# Patient Record
Sex: Female | Born: 1950 | Race: White | Hispanic: No | Smoking: Never smoker
Health system: Southern US, Community
[De-identification: ages and names within clinical notes are randomized; demographics above are authoritative.]

## PROBLEM LIST (undated history)

## (undated) DIAGNOSIS — C801 Malignant (primary) neoplasm, unspecified: Secondary | ICD-10-CM

## (undated) DIAGNOSIS — I10 Essential (primary) hypertension: Secondary | ICD-10-CM

## (undated) DIAGNOSIS — I2510 Atherosclerotic heart disease of native coronary artery without angina pectoris: Secondary | ICD-10-CM

## (undated) DIAGNOSIS — F419 Anxiety disorder, unspecified: Secondary | ICD-10-CM

## (undated) DIAGNOSIS — I739 Peripheral vascular disease, unspecified: Secondary | ICD-10-CM

## (undated) DIAGNOSIS — M792 Neuralgia and neuritis, unspecified: Secondary | ICD-10-CM

## (undated) DIAGNOSIS — Z87442 Personal history of urinary calculi: Secondary | ICD-10-CM

## (undated) DIAGNOSIS — E785 Hyperlipidemia, unspecified: Secondary | ICD-10-CM

## (undated) DIAGNOSIS — G709 Myoneural disorder, unspecified: Secondary | ICD-10-CM

## (undated) DIAGNOSIS — I6381 Other cerebral infarction due to occlusion or stenosis of small artery: Secondary | ICD-10-CM

## (undated) DIAGNOSIS — M1990 Unspecified osteoarthritis, unspecified site: Secondary | ICD-10-CM

## (undated) DIAGNOSIS — I639 Cerebral infarction, unspecified: Secondary | ICD-10-CM

## (undated) DIAGNOSIS — E119 Type 2 diabetes mellitus without complications: Secondary | ICD-10-CM

## (undated) HISTORY — PX: EYE SURGERY: SHX253

## (undated) HISTORY — DX: Peripheral vascular disease, unspecified: I73.9

## (undated) HISTORY — DX: Type 2 diabetes mellitus without complications: E11.9

## (undated) HISTORY — DX: Other cerebral infarction due to occlusion or stenosis of small artery: I63.81

## (undated) HISTORY — PX: FRACTURE SURGERY: SHX138

## (undated) HISTORY — PX: CHOLECYSTECTOMY: SHX55

## (undated) HISTORY — DX: Hyperlipidemia, unspecified: E78.5

## (undated) HISTORY — DX: Essential (primary) hypertension: I10

## (undated) HISTORY — PX: MASTECTOMY: SHX3

---

## 2001-10-04 ENCOUNTER — Other Ambulatory Visit: Admission: RE | Admit: 2001-10-04 | Discharge: 2001-10-04 | Payer: Self-pay | Admitting: Family Medicine

## 2001-10-11 ENCOUNTER — Encounter: Admission: RE | Admit: 2001-10-11 | Discharge: 2001-10-11 | Payer: Self-pay | Admitting: Family Medicine

## 2001-10-11 ENCOUNTER — Encounter: Payer: Self-pay | Admitting: Family Medicine

## 2005-11-15 HISTORY — PX: CORONARY ARTERY BYPASS GRAFT: SHX141

## 2006-10-20 ENCOUNTER — Ambulatory Visit: Payer: Self-pay | Admitting: Cardiology

## 2006-10-24 ENCOUNTER — Ambulatory Visit: Payer: Self-pay | Admitting: Cardiology

## 2006-10-27 ENCOUNTER — Ambulatory Visit: Payer: Self-pay | Admitting: Cardiovascular Disease

## 2006-10-27 ENCOUNTER — Inpatient Hospital Stay (HOSPITAL_BASED_OUTPATIENT_CLINIC_OR_DEPARTMENT_OTHER): Admission: RE | Admit: 2006-10-27 | Discharge: 2006-10-27 | Payer: Self-pay | Admitting: Cardiology

## 2006-11-10 ENCOUNTER — Inpatient Hospital Stay (HOSPITAL_COMMUNITY)
Admission: RE | Admit: 2006-11-10 | Discharge: 2006-11-14 | Payer: Self-pay | Admitting: Thoracic Surgery (Cardiothoracic Vascular Surgery)

## 2006-12-12 ENCOUNTER — Ambulatory Visit: Payer: Self-pay | Admitting: Cardiology

## 2006-12-14 ENCOUNTER — Ambulatory Visit: Payer: Self-pay | Admitting: Cardiology

## 2007-01-03 ENCOUNTER — Ambulatory Visit: Payer: Self-pay | Admitting: Cardiology

## 2007-01-09 ENCOUNTER — Ambulatory Visit: Payer: Self-pay | Admitting: Thoracic Surgery (Cardiothoracic Vascular Surgery)

## 2007-05-11 ENCOUNTER — Ambulatory Visit: Payer: Self-pay | Admitting: Cardiology

## 2007-05-11 ENCOUNTER — Emergency Department (HOSPITAL_COMMUNITY): Admission: EM | Admit: 2007-05-11 | Discharge: 2007-05-11 | Payer: Self-pay | Admitting: Emergency Medicine

## 2007-06-13 ENCOUNTER — Ambulatory Visit: Payer: Self-pay | Admitting: Cardiology

## 2007-07-04 ENCOUNTER — Ambulatory Visit: Payer: Self-pay | Admitting: Cardiology

## 2008-01-09 ENCOUNTER — Ambulatory Visit: Payer: Self-pay | Admitting: Cardiology

## 2008-01-09 ENCOUNTER — Encounter: Payer: Self-pay | Admitting: Physician Assistant

## 2008-03-08 ENCOUNTER — Ambulatory Visit: Payer: Self-pay | Admitting: Cardiology

## 2008-03-22 ENCOUNTER — Ambulatory Visit: Payer: Self-pay | Admitting: Cardiology

## 2008-03-22 ENCOUNTER — Inpatient Hospital Stay (HOSPITAL_COMMUNITY): Admission: EM | Admit: 2008-03-22 | Discharge: 2008-03-26 | Payer: Self-pay | Admitting: Emergency Medicine

## 2008-03-22 ENCOUNTER — Encounter (INDEPENDENT_AMBULATORY_CARE_PROVIDER_SITE_OTHER): Payer: Self-pay | Admitting: Pediatrics

## 2008-08-25 ENCOUNTER — Emergency Department (HOSPITAL_COMMUNITY): Admission: EM | Admit: 2008-08-25 | Discharge: 2008-08-25 | Payer: Self-pay | Admitting: Emergency Medicine

## 2008-09-20 ENCOUNTER — Encounter: Payer: Self-pay | Admitting: Cardiology

## 2008-10-30 ENCOUNTER — Encounter: Payer: Self-pay | Admitting: Physician Assistant

## 2008-10-30 ENCOUNTER — Ambulatory Visit: Payer: Self-pay | Admitting: Cardiology

## 2008-11-01 ENCOUNTER — Encounter: Payer: Self-pay | Admitting: Physician Assistant

## 2008-11-15 HISTORY — PX: MASTECTOMY: SHX3

## 2008-11-28 ENCOUNTER — Encounter: Payer: Self-pay | Admitting: Cardiology

## 2008-11-28 ENCOUNTER — Encounter: Payer: Self-pay | Admitting: Physician Assistant

## 2008-11-28 ENCOUNTER — Ambulatory Visit: Payer: Self-pay | Admitting: Cardiology

## 2009-04-01 ENCOUNTER — Ambulatory Visit: Payer: Self-pay | Admitting: Cardiology

## 2009-07-31 DIAGNOSIS — E782 Mixed hyperlipidemia: Secondary | ICD-10-CM | POA: Insufficient documentation

## 2009-07-31 DIAGNOSIS — E785 Hyperlipidemia, unspecified: Secondary | ICD-10-CM

## 2009-07-31 DIAGNOSIS — I10 Essential (primary) hypertension: Secondary | ICD-10-CM

## 2009-07-31 DIAGNOSIS — E1169 Type 2 diabetes mellitus with other specified complication: Secondary | ICD-10-CM

## 2009-07-31 DIAGNOSIS — I25810 Atherosclerosis of coronary artery bypass graft(s) without angina pectoris: Secondary | ICD-10-CM | POA: Insufficient documentation

## 2009-07-31 HISTORY — DX: Essential (primary) hypertension: I10

## 2009-10-20 ENCOUNTER — Encounter: Admission: RE | Admit: 2009-10-20 | Discharge: 2009-10-20 | Payer: Self-pay | Admitting: Neurology

## 2009-11-15 HISTORY — PX: OTHER SURGICAL HISTORY: SHX169

## 2009-11-26 ENCOUNTER — Encounter: Admission: RE | Admit: 2009-11-26 | Discharge: 2009-11-26 | Payer: Self-pay | Admitting: Internal Medicine

## 2009-12-01 ENCOUNTER — Encounter: Payer: Self-pay | Admitting: Cardiology

## 2009-12-04 ENCOUNTER — Encounter: Admission: RE | Admit: 2009-12-04 | Discharge: 2009-12-04 | Payer: Self-pay | Admitting: Internal Medicine

## 2009-12-12 ENCOUNTER — Telehealth (INDEPENDENT_AMBULATORY_CARE_PROVIDER_SITE_OTHER): Payer: Self-pay | Admitting: *Deleted

## 2009-12-15 ENCOUNTER — Encounter: Payer: Self-pay | Admitting: Cardiology

## 2009-12-16 ENCOUNTER — Ambulatory Visit: Payer: Self-pay | Admitting: Cardiology

## 2009-12-16 DIAGNOSIS — C50919 Malignant neoplasm of unspecified site of unspecified female breast: Secondary | ICD-10-CM | POA: Insufficient documentation

## 2009-12-16 DIAGNOSIS — I4510 Unspecified right bundle-branch block: Secondary | ICD-10-CM

## 2009-12-19 ENCOUNTER — Ambulatory Visit (HOSPITAL_COMMUNITY): Admission: RE | Admit: 2009-12-19 | Discharge: 2009-12-19 | Payer: Self-pay | Admitting: Hematology and Oncology

## 2010-01-05 ENCOUNTER — Encounter (INDEPENDENT_AMBULATORY_CARE_PROVIDER_SITE_OTHER): Payer: Self-pay | Admitting: General Surgery

## 2010-01-05 ENCOUNTER — Inpatient Hospital Stay (HOSPITAL_COMMUNITY): Admission: RE | Admit: 2010-01-05 | Discharge: 2010-01-07 | Payer: Self-pay | Admitting: General Surgery

## 2010-01-12 ENCOUNTER — Encounter: Payer: Self-pay | Admitting: Cardiology

## 2010-01-27 ENCOUNTER — Encounter: Payer: Self-pay | Admitting: Cardiology

## 2010-02-12 ENCOUNTER — Ambulatory Visit (HOSPITAL_COMMUNITY): Admission: RE | Admit: 2010-02-12 | Discharge: 2010-02-12 | Payer: Self-pay | Admitting: General Surgery

## 2010-03-09 ENCOUNTER — Encounter: Payer: Self-pay | Admitting: Cardiology

## 2010-03-17 ENCOUNTER — Encounter: Payer: Self-pay | Admitting: Cardiology

## 2010-05-14 ENCOUNTER — Encounter: Payer: Self-pay | Admitting: Cardiology

## 2010-05-19 ENCOUNTER — Ambulatory Visit: Admission: RE | Admit: 2010-05-19 | Discharge: 2010-08-05 | Payer: Self-pay | Admitting: Radiation Oncology

## 2010-06-25 ENCOUNTER — Encounter: Payer: Self-pay | Admitting: Cardiology

## 2010-08-06 ENCOUNTER — Encounter: Payer: Self-pay | Admitting: Cardiology

## 2010-08-17 ENCOUNTER — Ambulatory Visit: Payer: Self-pay | Admitting: Cardiology

## 2010-12-06 ENCOUNTER — Encounter: Payer: Self-pay | Admitting: Neurology

## 2010-12-06 ENCOUNTER — Encounter: Payer: Self-pay | Admitting: General Surgery

## 2010-12-08 ENCOUNTER — Encounter: Payer: Self-pay | Admitting: Cardiology

## 2010-12-17 NOTE — Letter (Signed)
Summary: External Correspondence/ [name]  External Correspondence/ [facility]   Imported By: Dorise Hiss 01/15/2010 16:02:19  _____________________________________________________________________  External Attachment:    Type:   Image     Comment:   External Document

## 2010-12-17 NOTE — Letter (Signed)
Summary: External [facility]ence/ [facility]  External Correspondence/ [facility]   Imported By: Dorise Hiss 12/18/2009 10:14:36  _____________________________________________________________________  External Attachment:    Type:   Image     Comment:   External Document

## 2010-12-17 NOTE — Letter (Signed)
Summary: External Correspondence/ [name]  External Correspondence/ [facility]   Imported By: Dorise Hiss 08/11/2010 09:05:17  _____________________________________________________________________  External Attachment:    Type:   Image     Comment:   External Document

## 2010-12-17 NOTE — Assessment & Plan Note (Signed)
Summary: rov-surgical clearance needed per   Visit Type:  Follow-up Primary Provider:  Marilu Mccann   History of Present Illness: the patient is a 60 year old female with a history of coronary disease status post coronary artery bypass grafting in December of 2007. The patient has recently been diagnosed with ductal carcinoma of the breast. I was asked by Dr. Cleone Mccann to see the patient in preoperative consultation prior to mastectomy. The patient currently takes aspirin Plavix in part for graft protection of coronary bypass grafts. Also because the patient has a history of cerebrovascular disease and lacunar strokes followed by Dr. Pearlean Mccann. Reportedly according to the patient's husband she had a CAT scan done recently with no evidence of stroke. She also cerebrovascular disease which is followed by carotid Dopplers. The patient denies any substernal chest pain. She has no shortness of breath. She has no orthopnea PND. From a cardiovascular standpoint she appears to be stable. She has no palpitations or syncope. The patient daughter questioned also the use of Lovenox perioperatively.  Clinical Review Panels:  CXR CXR results There has been previous CABG.  Heart size normal.         Vascular clips right upper abdomen.  No effusion.  Lungs clear.                   IMPRESSION:         No acute disease post CABG (11/28/2008)  Echocardiogram Echocardiogram  -  There is question of mild septal dyssynergy. Overall left         ventricular systolic function was normal. Left ventricular         ejection fraction was estimated to be 60 %.   -  Normal aortic valve   -  Normal mitral valve   -  The left atrium was mildly dilated.   -  The right ventricle was mildly dilated.   -  There is no obvious cardiac source of embolus.    IMPRESSIONS   -  There is no obvious cardiac source of embolus. (03/22/2008)  Carotid Studies Carotid Doppler Results MPRESSION: The vertebral arteries are patent bilaterally  with         antegrade flow. Carotid and vertebral wave forms appear normal. No         significant plaque or stenosis is seen. Some retrograde flow was seen          in diastole. The velocity in each internal carotid artery is in the         less than 50% stenosis range bilaterally. (09/06/2007)  Cardiac Imaging Cardiac Cath Findings  ASSESSMENT:  1. Severe native three-vessel coronary artery disease.  2. Moderate segmental left ventricular dysfunction.   DISCUSSION:  I will review the films further with colleagues.  However,  the patient has severe disease with the presence of LV dysfunction in  the setting of underlying diabetes.  I think she would best be treated  with coronary bypass surgery.  Unfortunately, her LAD target is  suboptimal due to a small vessel and diffuse disease throughout the mid  and distal portions of the vessel.  Her obtuse marginal target is good,  as it is a large-caliber vessel with very little disease in its mid and  distal portions.  The posterolateral branches of the left circumflex  were also fairly small but may be reasonable targets.  The right  coronary artery is diffusely diseased, and I am not sure that her PD  branch which fills from  left-sided collaterals will be a good target.  I  will ask for one of the surgeons in the CVTS group to review her films  and give [place] their opinion.  In the meantime, I will review the patient's  medical therapy a make sure that she is on an appropriate anti-ischemic  regimen.   [name]. [name] Seltzer, MD (10/27/2006)    Current Medications (verified): 1)  Novolog 100 Unit/ml Soln (Insulin Aspart) .... Sliding Scale 2)  Lantus 100 Unit/ml Soln (Insulin Glargine) .... 20 Units Bedtime 3)  Metformin Hcl 1000 Mg Tabs (Metformin Hcl) .... Take 1 Tab Two Times A Day 4)  Neurontin 300 Mg Caps (Gabapentin) .... Take 1 Tab Two Times A Day 5)  Simvastatin 80 Mg Tabs (Simvastatin) .... Take 1 Tab At Bedtime 6)  Aspir-Low 81 Mg  Tbec (Aspirin) .... Take 1 Tab Daily 7)  Plavix 75 Mg Tabs (Clopidogrel Bisulfate) .... Take 1 Tab Daily 8)  Neurpath-B 3-35-2 Mg Tabs (L-Methylfolate-B6-B12) .... Take 1 Tab Daily 9)  Mobic 7.5 Mg Tabs (Meloxicam) .... Take 1 Tab Two Times A Day 10)  Fosamax 70 Mg Tabs (Alendronate Sodium) .... Take 1 Tab Weekly 11)  Celexa 10 Mg Tabs (Citalopram Hydrobromide) .... Take 1 Tab Daily  Allergies (verified): No Known Drug Allergies  Past History:  Past Medical History: Last updated: 07/31/2009 HYPERLIPIDEMIA-MIXED (ICD-272.4) HYPERTENSION, UNSPECIFIED (ICD-401.9) CAD, ARTERY BYPASS GRAFT (ICD-414.04) Cerebrovascular disease followed by Dr. Pearlean Mccann.  History of lacunar strokes.  Ataxia, resolved.  Type 2 diabetes mellitus  Past Surgical History: Last updated: 07/31/2009 CABG eye  Family History: Last updated: 07/31/2009 Family History of Coronary Artery Disease:  Family History of CVA or Stroke:   Social History: Last updated: 07/31/2009 Full Time Married  Tobacco Use - No.  Alcohol Use - no Drug Use - no  Risk Factors: Smoking Status: never (07/31/2009)  Review of Systems       The patient complains of fatigue and anxiety.  The patient denies malaise, fever, weight gain/loss, vision loss, decreased hearing, hoarseness, chest pain, palpitations, shortness of breath, prolonged cough, wheezing, sleep apnea, coughing up blood, abdominal pain, blood in stool, nausea, vomiting, diarrhea, heartburn, incontinence, blood in urine, muscle weakness, joint pain, leg swelling, rash, skin lesions, headache, fainting, dizziness, depression, enlarged lymph nodes, easy bruising or bleeding, and environmental allergies.    Vital Signs:  Patient profile:   60 year old female Height:      64 inches Weight:      136 pounds BMI:     23.43 Pulse rate:   71 / minute BP sitting:   135 / 75  (right arm)  Vitals Entered By: [name] Saa, CNA (December 16, 2009 9:25 AM)  Physical  Exam  Additional Exam:  General: Well-developed, well-nourished in no distress head: Normocephalic and atraumatic eyes PERRLA/EOMI intact, conjunctiva and lids normal nose: No deformity or lesions mouth normal dentition, normal posterior pharynx neck: Supple, no JVD.  No masses, thyromegaly or abnormal cervical nodes lungs: Normal breath sounds bilaterally without wheezing.  Normal percussion heart: regular rate and rhythm with normal S1 and S2, no S3 or S4.  PMI is normal.  No pathological murmurs abdomen: Normal bowel sounds, abdomen is soft and nontender without masses, organomegaly or hernias noted.  No hepatosplenomegaly musculoskeletal: Back normal, normal gait muscle strength and tone normal pulsus: Pulse is normal in all 4 extremities Extremities: No peripheral pitting edema neurologic: Alert and oriented x 3 skin: Intact without lesions or rashes cervical  nodes: No significant adenopathy psychologic: Normal affect    EKG  Procedure date:  12/16/2009  Findings:      normal sinus rhythm, borderline left atrial enlargement and right bundle branch block. Nonspecific ST-T wave changes.  Impression & Recommendations:  Problem # 1:  CAD, ARTERY BYPASS GRAFT (ICD-414.04) the patient is status post coronary bypass grafting.she has currently no chest pain. She has no significant cardiovascular symptoms. From a cardiac perspective it appears to be safe to stop her aspirin and Plavix approximately one week before surgery. I would recommend that aspirin and Plavix be restarted as soon as possible after surgery. There is currently no indication for cardiovascular stress testing prior to surgery. The patient is asymptomatic and she had coronary bypass grafting within the last 5 years. Her electrocardiogram shows normal sinus rhythm with right bundle branch block which is chronic. Her updated medication list for this problem includes:    Aspir-low 81 Mg Tbec (Aspirin) .Marland Kitchen... Take 1 tab  daily    Plavix 75 Mg Tabs (Clopidogrel bisulfate) .Marland Kitchen... Take 1 tab daily  Problem # 2:  HYPERTENSION, UNSPECIFIED (ICD-401.9) blood pressure is currently well controlled. I made no changes in the patient's medical regimen. Her updated medication list for this problem includes:    Aspir-low 81 Mg Tbec (Aspirin) .Marland Kitchen... Take 1 tab daily  Problem # 3:  BREAST CANCER (ICD-174.9) patient is scheduled for mastectomy.  Problem # 4:  PREOPERATIVE EXAMINATION (ICD-V72.84) as outlined above the patient should be safe at low risk from a cardiovascular standpoint for mastectomy.  Patient Instructions: 1)  Stop Aspirin and Plavix one week prior to surgery and resume as soon as possible after. 2)  Follow up in  6 months.  Appended Document: rov-surgical clearance needed per Received fax from [facility] (Dr. Dwain Sarna) stating that he is okay for pt. to remain on her Aspirin the entire time.  Discussed with Dr. Andee Lineman and he is okay with this as well.

## 2010-12-17 NOTE — Letter (Signed)
Summary: [facility] OFFICE NOTE  [facility] OFFICE NOTE   Imported By: Claudette Laws 02/03/2010 09:14:41  _____________________________________________________________________  External Attachment:    Type:   Image     Comment:   External Document

## 2010-12-17 NOTE — Letter (Signed)
Summary: External Correspondence/ [name]  External Correspondence/ [facility]   Imported By: Dorise Hiss 06/30/2010 12:34:41  _____________________________________________________________________  External Attachment:    Type:   Image     Comment:   External Document

## 2010-12-17 NOTE — Assessment & Plan Note (Signed)
Summary: 6  mo fu per aug remidner   Visit Type:  Follow-up Primary Provider:  Marilu Favre   History of Present Illness: the patient is a 60 year old female with history of coronary artery disease, status post coronary bypass grafting in December of 2007. The patient has been diagnosed with breast cancer. She status post mastectomy and axillary lymph node dissection/2/4 nodes positive/ as well as chemotherapy and radiation therapy. The patient is currently on an aromatase inhibitor.  The patient denies any substernal chest pain no shortness of breath . She denies orthopnea or PND. She has no palpitations or syncope. She denies palpitations. From a cardiac standpoint she appears to be stable. She's compliant with her medical regimen.  Preventive Screening-Counseling & Management  Alcohol-Tobacco     Smoking Status: never  Current Medications (verified): 1)  Novolog 100 Unit/ml Soln (Insulin Aspart) .... Sliding Scale 2)  Lantus 100 Unit/ml Soln (Insulin Glargine) .... Use As Directed At Bedtime 3)  Metformin Hcl 1000 Mg Tabs (Metformin Hcl) .... Take 1 Tab Two Times A Day 4)  Neurontin 300 Mg Caps (Gabapentin) .... Take 1 Tab Two Times A Day 5)  Simvastatin 80 Mg Tabs (Simvastatin) .... Take 1 Tab At Bedtime 6)  Aspir-Low 81 Mg Tbec (Aspirin) .... Take 1 Tab Daily 7)  Plavix 75 Mg Tabs (Clopidogrel Bisulfate) .... Take 1 Tab Daily 8)  Neurpath-B 3-35-2 Mg Tabs (L-Methylfolate-B6-B12) .... Take 1 Tab Daily 9)  Mobic 7.5 Mg Tabs (Meloxicam) .... Take 1 Tab Two Times A Day 10)  Fosamax 70 Mg Tabs (Alendronate Sodium) .... Take 1 Tab Weekly 11)  Celexa 10 Mg Tabs (Citalopram Hydrobromide) .... Take 1 Tab Daily 12)  Folic Acid 1 Mg Tabs (Folic Acid) .... Take 1 Tablet By Mouth Once A Day 13)  Triamterene-Hctz 37.5-25 Mg Tabs (Triamterene-Hctz) .... Take 1 Tablet By Mouth Once A Day 14)  Letrozole 2.5 Mg Tabs (Letrozole) .... Take 1 Tablet By Mouth Once A Day  Allergies (verified): No Known  Drug Allergies  Comments:  Nurse/Medical Assistant: The patient's medication list and allergies were reviewed with the patient and were updated in the Medication and Allergy Lists.  Past History:  Past Medical History: HYPERLIPIDEMIA-MIXED (ICD-272.4) HYPERTENSION, UNSPECIFIED (ICD-401.9) CAD, ARTERY BYPASS GRAFT (ICD-414.04) Cerebrovascular disease followed by Dr. Pearlean Brownie.  History of lacunar strokes.  Ataxia, resolved.  Type 2 diabetes mellitus Status post mastectomy 2011 Status post chemotherapy and radiation therapy aromatase inhibitor therapy  Review of Systems       The patient complains of fatigue.  The patient denies malaise, fever, weight gain/loss, vision loss, decreased hearing, hoarseness, chest pain, palpitations, prolonged cough, wheezing, sleep apnea, coughing up blood, abdominal pain, blood in stool, nausea, vomiting, diarrhea, heartburn, incontinence, blood in urine, muscle weakness, joint pain, leg swelling, rash, skin lesions, headache, fainting, dizziness, depression, anxiety, enlarged lymph nodes, easy bruising or bleeding, and environmental allergies.    Vital Signs:  Patient profile:   60 year old female Height:      64 inches Weight:      137 pounds Pulse rate:   60 / minute BP sitting:   130 / 78  (right arm) Cuff size:   regular  Vitals Entered By: Carlye Grippe (August 17, 2010 2:48 PM)  Physical Exam  Additional Exam:  General: Well-developed, well-nourished in no distress head: Normocephalic and atraumatic eyes PERRLA/EOMI intact, conjunctiva and lids normal nose: No deformity or lesions mouth normal dentition, normal posterior pharynx neck: Supple, no JVD.  No  masses, thyromegaly or abnormal cervical nodes lungs: Normal breath sounds bilaterally without wheezing.  Normal percussion heart: regular rate and rhythm with normal S1 and S2, no S3 or S4.  PMI is normal.  social systolic murmur 2/6 abdomen: Normal bowel sounds, abdomen is soft and  nontender without masses, organomegaly or hernias noted.  No hepatosplenomegaly musculoskeletal: Back normal, normal gait muscle strength and tone normal pulsus: Pulse is normal in all 4 extremities Extremities: No peripheral pitting edema neurologic: Alert and oriented x 3 skin: Intact without lesions or rashes cervical nodes: No significant adenopathy psychologic: Normal affect    Impression & Recommendations:  Problem # 1:  BREAST CANCER (ICD-174.9) followed by Dr. Cleone Slim. She is status post mastectomy, chemoradiation therapy and is on aromatase inhibitor therapy  Problem # 2:  HYPERTENSION, UNSPECIFIED (ICD-401.9) blood pressure stable. Continue current medical regimen Her updated medication list for this problem includes:    Aspir-low 81 Mg Tbec (Aspirin) .Marland Kitchen... Take 1 tab daily    Triamterene-hctz 37.5-25 Mg Tabs (Triamterene-hctz) .Marland Kitchen... Take 1 tablet by mouth once a day  Problem # 3:  CAD, ARTERY BYPASS GRAFT (ICD-414.04) no recurrent chest pain. No indication for stress testing. Continue risk factor modification. Her updated medication list for this problem includes:    Aspir-low 81 Mg Tbec (Aspirin) .Marland Kitchen... Take 1 tab daily    Plavix 75 Mg Tabs (Clopidogrel bisulfate) .Marland Kitchen... Take 1 tab daily  Patient Instructions: 1)  Your physician recommends that you continue on your current medications as directed. Please refer to the Current Medication list given to you today. 2)  Follow up in  1 year

## 2010-12-17 NOTE — Letter (Signed)
Summary: External Correspondence/ [name]  External Correspondence/ [facility]   Imported By: Dorise Hiss 03/19/2010 12:08:03  _____________________________________________________________________  External Attachment:    Type:   Image     Comment:   External Document

## 2010-12-17 NOTE — Letter (Signed)
Summary: External Correspondence/ [name] NOTE  External Correspondence/ [facility]ENTER NOTE   Imported By: Dorise Hiss 05/19/2010 11:55:12  _____________________________________________________________________  External Attachment:    Type:   Image     Comment:   External Document

## 2010-12-17 NOTE — Progress Notes (Signed)
Summary: ? off plavix, asa  Phone Note Other Incoming   Caller: Alicia with Dr. Dwain Sarna Summary of Call: [phone] - overhead page  Pt. has pending lymph node biopsy.  Want to know if okay to be off ASA x 3 days and off Plavix x 5 days.   Hoover Brunette, LPN  December 12, 2009 4:13 PM   Follow-up for Phone Call        Yes, should be no problem. Restart particularly ASA ASAP post surgery Follow-up by: Lewayne Bunting, MD, [facility],  December 14, 2009 1:07 PM  Additional Follow-up for Phone Call Additional follow up Details #1::        Helmut Muster notified.  Will fax note to her.   Additional Follow-up by: Hoover Brunette, LPN,  December 15, 2009 4:26 PM

## 2010-12-17 NOTE — Consult Note (Signed)
Summary: CARDIOLOGY CONSULT/ [facility]  CARDIOLOGY CONSULT/ [facility]   Imported By: Zachary George 12/15/2009 16:57:42  _____________________________________________________________________  External Attachment:    Type:   Image     Comment:   External Document

## 2010-12-17 NOTE — Letter (Signed)
Summary: External Correspondence/ [name]  External Correspondence/ [facility]   Imported By: Dorise Hiss 12/10/2010 10:30:49  _____________________________________________________________________  External Attachment:    Type:   Image     Comment:   External Document

## 2010-12-17 NOTE — Letter (Signed)
Summary: [facility] - OFFICE NOTE  [facility] - OFFICE NOTE   Imported By: Claudette Laws 03/12/2010 10:56:36  _____________________________________________________________________  External Attachment:    Type:   Image     Comment:   External Document

## 2011-02-03 LAB — GLUCOSE, CAPILLARY
Glucose-Capillary: 129 mg/dL — ABNORMAL HIGH (ref 70–99)
Glucose-Capillary: 129 mg/dL — ABNORMAL HIGH (ref 70–99)
Glucose-Capillary: 143 mg/dL — ABNORMAL HIGH (ref 70–99)
Glucose-Capillary: 146 mg/dL — ABNORMAL HIGH (ref 70–99)
Glucose-Capillary: 180 mg/dL — ABNORMAL HIGH (ref 70–99)

## 2011-02-03 LAB — CBC
HCT: 37.2 % (ref 36.0–46.0)
Hemoglobin: 10.5 g/dL — ABNORMAL LOW (ref 12.0–15.0)
Hemoglobin: 11.3 g/dL — ABNORMAL LOW (ref 12.0–15.0)
MCHC: 33.6 g/dL (ref 30.0–36.0)
MCHC: 33.6 g/dL (ref 30.0–36.0)
MCHC: 33.8 g/dL (ref 30.0–36.0)
MCV: 90.1 fL (ref 78.0–100.0)
Platelets: 199 10*3/uL (ref 150–400)
Platelets: 230 10*3/uL (ref 150–400)
RBC: 3.72 MIL/uL — ABNORMAL LOW (ref 3.87–5.11)
RDW: 15 % (ref 11.5–15.5)
RDW: 15.2 % (ref 11.5–15.5)
WBC: 6.8 10*3/uL (ref 4.0–10.5)

## 2011-02-03 LAB — BASIC METABOLIC PANEL
BUN: 10 mg/dL (ref 6–23)
CO2: 26 mEq/L (ref 19–32)
CO2: 28 mEq/L (ref 19–32)
Calcium: 8.8 mg/dL (ref 8.4–10.5)
Calcium: 9.1 mg/dL (ref 8.4–10.5)
Chloride: 106 mEq/L (ref 96–112)
Creatinine, Ser: 0.64 mg/dL (ref 0.4–1.2)
GFR calc Af Amer: >60 mL/min (ref >60)
GFR calc non Af Amer: >60 mL/min (ref >60)
Glucose, Bld: 101 mg/dL — ABNORMAL HIGH (ref 70–99)
Sodium: 135 mEq/L (ref 135–145)
Sodium: 137 mEq/L (ref 135–145)

## 2011-02-03 LAB — COMPREHENSIVE METABOLIC PANEL
Alkaline Phosphatase: 71 U/L (ref 39–117)
BUN: 16 mg/dL (ref 6–23)
CO2: 27 mEq/L (ref 19–32)
Chloride: 109 mEq/L (ref 96–112)
Glucose, Bld: 127 mg/dL — ABNORMAL HIGH (ref 70–99)
Potassium: 5 mEq/L (ref 3.5–5.1)
Total Bilirubin: 0.2 mg/dL — ABNORMAL LOW (ref 0.3–1.2)

## 2011-02-03 LAB — DIFFERENTIAL
Basophils Absolute: 0 10*3/uL (ref 0.0–0.1)
Basophils Relative: 1 % (ref 0–1)
Neutro Abs: 4.6 10*3/uL (ref 1.7–7.7)
Neutrophils Relative %: 65 % (ref 43–77)

## 2011-02-03 LAB — CEA: CEA: 2.6 ng/mL (ref 0.0–5.0)

## 2011-02-04 LAB — GLUCOSE, CAPILLARY: Glucose-Capillary: 74 mg/dL (ref 70–99)

## 2011-02-07 LAB — DIFFERENTIAL
Basophils Absolute: 0 10*3/uL (ref 0.0–0.1)
Eosinophils Absolute: 0.1 10*3/uL (ref 0.0–0.7)
Eosinophils Relative: 2 % (ref 0–5)
Lymphocytes Relative: 19 % (ref 12–46)
Lymphs Abs: 1.2 10*3/uL (ref 0.7–4.0)
Neutrophils Relative %: 71 % (ref 43–77)

## 2011-02-07 LAB — PROTIME-INR
INR: 0.87 (ref 0.00–1.49)
Prothrombin Time: 11.8 seconds (ref 11.6–15.2)

## 2011-02-07 LAB — CBC
HCT: 35.4 % — ABNORMAL LOW (ref 36.0–46.0)
Platelets: 278 10*3/uL (ref 150–400)
RDW: 15.6 % — ABNORMAL HIGH (ref 11.5–15.5)
WBC: 6.5 10*3/uL (ref 4.0–10.5)

## 2011-02-07 LAB — BASIC METABOLIC PANEL
BUN: 15 mg/dL (ref 6–23)
Calcium: 10.3 mg/dL (ref 8.4–10.5)
Creatinine, Ser: 0.76 mg/dL (ref 0.4–1.2)
GFR calc non Af Amer: >60 mL/min (ref >60)
Glucose, Bld: 120 mg/dL — ABNORMAL HIGH (ref 70–99)

## 2011-02-07 LAB — GLUCOSE, CAPILLARY
Glucose-Capillary: 100 mg/dL — ABNORMAL HIGH (ref 70–99)
Glucose-Capillary: 88 mg/dL (ref 70–99)

## 2011-03-30 NOTE — Discharge Summary (Signed)
[name], [name]             ACCOUNT NO.:  [id number]   MEDICAL RECORD NO.:  [id number]          PATIENT TYPE:  INP   LOCATION:  5503                         FACILITY:  [facility]   PHYSICIAN:  Pramod P. Pearlean Brownie, MD    DATE OF BIRTH:  01-24-1951   DATE OF ADMISSION:  03/21/2008  DATE OF DISCHARGE:  03/26/2008                               DISCHARGE SUMMARY   DIAGNOSES AT TIME OF DISCHARGE:  1. Severe cavernous carotid stenosis.  2. Syncope, questionably related to stenosis/other.  3. Anxiety.  4. Type 2 diabetes.  5. Hypertension.  6. Dyslipidemia.  7. Painful diabetic neuropathy.  8. History of previous stroke.  9. Atherosclerotic vascular disease with coronary artery bypass graft      x3.  10.Laser surgery to her eye for her diabetes.   MEDICINES AT TIME OF DISCHARGE:  1. Lantus 20 units nightly.  2. NovoLog sliding scale insulin.  3. __________  75 mg b.i.d.  4. Pravastatin 40 mg nightly.  5. Metformin 1000 mg b.i.d.  6. Neurontin 100 mg b.i.d.  7. Xanax 0.25 mg nightly.  8. Plavix 75 mg a day.  9. Aspirin 81 mg a day.  10.Celexa 10 mg a day.  11.Metanx one a day.   STUDIES PERFORMED:  1. CT of the brain on admission shows no acute abnormalities, lacunar      infarct.  Right basal ganglia lacuna.  2. MRI of the brain shows no acute abnormality.  Remote lacuna in the      brain stem left greater than right corona radiata.  Chronic left      maxillary sinus disease.  3. MRA of the head shows mild atherosclerotic-type irregularity of      internal carotid arteries bilaterally.  Potential high-grade      stenosis of the distal left cavernous carotid artery.  Segmental      irregularity of MCA and PCA branches bilaterally compatible with      intracranial atherosclerotic-type changes.  Potential stenosis of      proximal left M1 segment.  4. CT angio of the brain shows high-grade stenosis of cavernous left      internal carotid artery associated with focal  calcification.      Proximal left M1 stenosis of 50-60%.  Branch vessels segmental      narrowing compatible with intracranial atherosclerotic-type      changes.  5. EKG, one with rightward axis deviation.  Then, other is normal      sinus rhythm.  6. __________  shows mild septal dyssynergy with EF of 60%.  No      obvious source of embolus.  7. Carotid Doppler shows no ICA stenosis.  8. Transcranial Doppler performed, results are pending.   LABORATORY STUDIES:  CBC with hemoglobin of 11.5, hematocrit 34.8,  otherwise normal chemistry, normal coags, and normal GI and liver  function tests.  Normal albumin 3.0.  Cardiac enzymes negative.  Cholesterol 93, triglycerides 91, HDL 32, and LDL 43.  Urinalysis is  negative.  Hemoglobin A1c 6.8.  Homocystine 22.   HISTORY OF PRESENT ILLNESS:  [name]  is a 60 year old right-  handed Caucasian female with multiple left brain stroke and a single  right pontine lesion with chronic left leg weakness and gait disorder  without right-sided symptoms.  The patient was last seen normal at 2:30  p.m. yesterday.  She was found at 3:00 p.m. sitting in her husband's  truck unresponsive.  It took her about 5 minutes to awaken.  When he got  her up, she seemed to be more unsteady on her feet, particularly weaker  on the left leg.  She was brought to the emergency room, but this was  not appreciated by the treatment team until 11 o'clock despite the fact  that the family arrived at 5:18 p.m.  Dr. Sharene Skeans then arrived to  evaluate her dystaxia.  He diagnosed her with syncope and possible TIA.  She was not a TPA candidate as the time of onset was greater than 3  hours.  She was admitted to the hospital for further stroke evaluation.   HOSPITAL COURSE:  MRI was negative for acute stroke.  It is felt her  syncope was likely related to stenosis in her cavernous carotid though  we cannot be sure.  There was some discussion about fixing her carotid   stenosis, but not sure it will fix the actual problem as it is not  certain that is the etiology.  Discussed having cerebral angiogram, PTCA  stent with the patient, and she is not interested at this time.  An EEG  was done in the hospital that was technically unrevealing, the formal  reading is pending, and plans were to repeat an outpatient EEG within  the next month or so.  We will also arrange outpatient cardiac  monitoring for the patient to evaluate for possible cardiac source of  syncope.  PT/OT evaluated the patient and recommended outpatient  therapies though the patient has refused.  We will discharge the patient  home on Plavix and follow up with Dr. Pearlean Brownie.   CONDITION AT DISCHARGE:  The patient alert and oriented x3.  Speech  clear.  No focal neurologic deficits.   DISCHARGE/PLAN:  1. Discharge home.  2. Aspirin and Plavix for secondary stroke prevention.  Consider      discontinuing one of these meds in 3-6 months.  3. Outpatient PT and OT recommended to see within one month.  4. Outpatient cardiac monitoring, [facility] consulted, and      they will contact the patient.  5. Follow up primary care physician within 1 month.  6. Follow up with Dr. Delia Heady in 2-3 months.      Annie Main, N.P.    ______________________________  Sunny Schlein. Pearlean Brownie, MD    SB/MEDQ  D:  03/26/2008  T:  03/27/2008  Job:  [id number]   cc:   Pramod P. Pearlean Brownie, MD  [facility]

## 2011-03-30 NOTE — Assessment & Plan Note (Signed)
[facility]                          [facility]   [name], [name] [name]                    MRN:          [id number]  DATE:01/09/2008                            DOB:          1951-02-27    PRIMARY CARDIOLOGIST:  Learta Codding, MD,  [facility]   REASON FOR VISIT:  Scheduled followup.   Since last seen here in the clinic in August 2008, by Dr. Andee Lineman, the  patient was briefly hospitalized here at [facility] (October 20 to 23rd,  2008) for symptoms suggestive of stroke.  She was admitted with ataxia  and was found to have an acute, small infarct in the right side of the  pons, by MRI imaging of the brain.  An initial head CT (without  contrast) indicated chronic lacunar infarcts, but with question of  possibly subacute, evolving lacunar infarcts on the right.  The patient  was seen by Dr. Ninetta Lights, the resident neurologist here in [place], and she  was subsequently placed on Plavix.  Carotid Dopplers were done and  normal.  A lipid profile indicated total cholesterol 163, HDL 41, and  LDL 88.   From a cardiac perspective, the patient denies any interim development  of signs/symptoms suggestive of unstable angina pectoris.  When she was  last seen in our clinic, she continued to complain of vertigo - like  symptoms, diagnosed as BPV by Dr. Andee Lineman.  He prescribed a 1-week course  of Xanax for her, and this apparently was quite effective.  She denies  any recurrent dizziness.  Of note, however, the recommendation to down-  titrate her atenolol from 25 to 12.5 mg daily was not executed.   Electrocardiogram today reveals sinus bradycardia 52 bpm with normal  axis and nonspecific ST changes.   The patient denies any dizziness or pre-syncope. According to her  husband, however, she has not fully regained her normal strength in the  left lower extremity.  She appears to have some residual intermittent  ataxia.  He is also concerned about some  unpredictable mood swings that  she appears to be manifesting, as well.   CURRENT MEDICATIONS:  1. Plavix.  2. Aspirin 81 daily.  3. Pravastatin 40 nightly.  4. Atenolol 25 a q. a.m.  5. Diclofenac 75 b.i.d.  6. Metformin 1000 mg b.i.d.  7. Lantus 15 units nightly.   PHYSICAL EXAM:  Blood pressure 145/76, pulse 53, regular; weight 136  GENERAL:  A 60 -year-old female sitting upright in no distress.  HEENT: Normocephalic, atraumatic.  NECK: Palpable bilateral carotid pulses without bruits. No JVD.  LUNGS:  Clear to auscultation in all fields.  HEART: Regular rate and rhythm (S1, S2). No significant murmurs. No  rubs.  ABDOMEN: Benign, nontender.  EXTREMITIES: Palpable femoral pulses without significant edema.  NEURO: Very flat affect, but no focal deficit.   IMPRESSION:  1. Multivessel coronary artery disease, stable.      a.     Three-vessel coronary artery bypass graft, December 2007.      b.     Preserved left ventricular function.  2. History of hypotension.  a.     Requiring discontinuation of lisinopril.  3. Status post recent right lacunar stroke.      a.     Associated ataxia.  4. Dyslipidemia.  5. Type 2 diabetes mellitus.  6. Asymptomatic sinus bradycardia.   PLAN:  1. Following discussion with Dr. Andee Lineman, recommendation is to refer      the patient to the [facility] in [facility] for      reassessment of her recurrent ataxia, as well as reported mood      swings.  2. Medication adjustments will be as follows:  down titration of      atenolol to 12.5 mg daily x2 weeks,  and then discontinue.  The      patient does not have any definite indication to remain on a beta      blocker, particularly in light of her persistent bradycardia.  I      would prefer to treat her hypertension with resumption of ACE      inhibitor.  We will therefore place her back on lisinopril, albeit      at the low dose of 5 mg daily, and closely monitor blood  pressures.      Up-titrate Pravachol to 80 mg nightly, for more aggressive lipid      management.  Her recent lipid profile shows an LDL of 88.  We will      try to achieve an LDL target of 70, or less, if feasible.  She will      then need a followup fasting lipid/liver profile in 12 weeks, for      reassessment.  3. We will have the patient's blood pressure/pulse checked at home in      1 week, by her daughter who is an LPN.  They can then contact our      office to apprise [place] of these results.  4. Schedule a followup BMET in 1 week, for monitoring of electrolytes      and renal function,      following resumption of ACE inhibitor.  5. Schedule return clinic followup with myself and Dr. Andee Lineman in 2      months.      Gene Serpe, PA-C  Electronically Signed      Learta Codding, MD,FACC  Electronically Signed   GS/MedQ  DD: 01/09/2008  DT: 01/09/2008  Job #: [id number]   cc:   Prescott Parma

## 2011-03-30 NOTE — Assessment & Plan Note (Signed)
[facility]                          [facility]   [name], [name] [name]                    MRN:          [id number]  DATE:07/04/2007                            DOB:          [birth year]    HISTORY OF PRESENT ILLNESS:  The patient is a 60 year old female with a  history of coronary artery bypass grafting in December 2007.  The  patient has been doing well from a cardiac perspective.  She reports no  chest pain or shortness of breath.  She reports some leg cramps and  restless legs.  Her husband tells me that she went to a healing  preacher who solves their problems.  The patient saw Gene last in the  office on June 13, 2007, and complained of dizziness but more likely  consistent with vertigo.  Some adjustment was made in her atenolol but  without any real significant improvement in the patient's symptoms.  The  patient reports to me that certain positions when turning over in bed,  raising her head, causes her to have vertiginous feelings and nausea.  The symptoms appear to be consistent with benign positional vertigo.   MEDICATIONS:  1. Lantus insulin.  2. Vytorin 5 mg p.o. b.i.d.  3. Aspirin 81 mg a day.  4. Pravastatin 40 mg p.o. q.h.s.  5. Atenolol 25 mg p.o. daily.  6. Diclofenac 75 mg p.o. b.i.d.   PHYSICAL EXAMINATION:  VITAL SIGNS:  Blood pressure 109/67, heart rate  61, weight 227 pounds.  NECK:  Normal carotid upstroke and no carotid bruits.  LUNGS:  Clear breath sounds bilaterally.  HEART:  Regular rate and rhythm, normal S1-S2, no murmurs or gallops.  ABDOMEN:  Soft.  EXTREMITIES:  No cyanosis, clubbing or edema.  NEUROLOGIC:  The patient is alert and oriented, grossly nonfocal.   PROBLEM LIST:  1. Vertigo, likely benign positional vertigo.  2. Multivessel coronary artery disease.      a.     Status post coronary artery bypass grafting December 2007.      b.     Normal left ventricular systolic function by echo  in January       2008.  3. History of hypotension requiring discontinuation of lisinopril.  4. Dyslipidemia.  5. Insulin-dependent diabetes mellitus.  6. Sinus bradycardia, resolved.   PLAN:  1. The patient's symptoms appear to be consistent with benign      positional vertigo.  I have given the patient a prescription for      Xanax 0.25 mg b.i.d. for 7 days for this condition.  2. I do not think further down-titration of atenolol is needed because      I do not think the patient's vertigo or dizziness is related to      bradycardia.  3. The patient can follow up with [place] in 6 months.  The patient also      reports a sore throat today, states that she is uncomfortable and      requests a prescription for antibiotics.  I provided a Z-Pak to the      patient for 5 days.  Learta Codding, MD,FACC  Electronically Signed    GED/MedQ  DD: 07/04/2007  DT: 07/05/2007  Job #: [id number]

## 2011-03-30 NOTE — H&P (Signed)
NAMEBRITTINIE, [name] NO.:  [id number]   MEDICAL RECORD NO.:  [id number]          PATIENT TYPE:  INP   LOCATION:  5501                         FACILITY:  [facility]   PHYSICIAN:  Deanna Artis. Hickling, M.D.DATE OF BIRTH:  Dec 26, 1950   DATE OF ADMISSION:  03/21/2008  DATE OF DISCHARGE:                              HISTORY & PHYSICAL   CHIEF COMPLAINT:  Syncope and ataxic gait.   HISTORY OF PRESENT CONDITION:  A 60 year old right-handed woman with  multiple left brain strokes and a single right pontine lesion with  chronic left leg weakness and gait disorder without right-sided  symptoms.  The patient was last normal at 1430.  She was found at 1500,  sitting in her husband's truck unresponsive.  It took her about 5  minutes to awaken.  When he got her up, she seemed to be more unsteady  on her feet, particularly weaker on her left leg.  She was brought to  the emergency room, but this was not appreciated by the treatment team  until 11 o'clock despite the fact family arrived at 5:18 p.m.   I was then called to see the patient to evaluate her dystaxia.   PAST MEDICAL HISTORY:  Type 2 diabetes mellitus, hypertension which is  improved recently, dyslipidemia, anxiety, painful diabetic peripheral  neuropathy, and prior stroke.  The patient has also had atherosclerotic  cardiovascular disease and had a coronary artery bypass graft x3 by Dr.  Tressie Stalker on November 07, 2006.  She is followed by Dr. Andee Lineman.  She  has had laser surgery to her eyes from her diabetes.   CURRENT MEDICATIONS:  Lantus insulin 20 units at nighttime, NovoLog  through the day as needed, Diflucan 75 mg twice daily for paronychia,  pravastatin 40 mg 2 at bedtime, metformin 500 mg 2 tablets twice daily,  gabapentin 300 mg 2 caps twice daily, alprazolam 0.25 mg 1 at bedtime,  Plavix 75 mg 1 daily, and Bayer aspirin 81 mg 1 daily.   ALLERGIES:  Drug allergies none known.   REVIEW OF SYSTEMS:   Positive only for osteoarthritis and postmenopausal.  Twelve-system review is otherwise negative are noted in the history of  the present illness.   FAMILY HISTORY:  Mother died of a stroke age 70.  Father died of  myocardial infarction age 78.  The patient's brother has had 3  myocardial infarctions.   SOCIAL HISTORY:  The patient has been married for 32 years as of today.  She has 3 children.  [place].  She does not use  tobacco, alcohol, or drugs.   PHYSICAL EXAMINATION:  GENERAL: On examination today, well-developed,  well-nourished woman in no acute distress.  She was sleepy at the onset  but was awake and alert for my exam.  VITAL SIGNS: Temperature 97.2, blood pressure 137/76, heart rate 64,  respirations 18, and oxygen saturation rate 94%.  HEAD, EYES, EARS, NOSE AND THROAT: No signs of infection.  The patient  has a right carotid bruit.  LUNGS: Clear.  HEART: No murmurs.  ABDOMEN: Soft.  Bowel sounds  normal.  NEURO/PSYCH: Mental status is normal.  Cranial nerves, round reactive  pupils.  She has scars in her eyes from laser surgery.  Motor  examination no drift.  The patient has good strength in her upper  extremities despite the fact she has pain in her right shoulder.  Her  left hip flexors 4 to 4+/5.  Right hip flexors 4+/5.  She has dysmetria  of finger-to-nose and heel-to-shin on the left arm and leg.  Her gait  shows left hemiparetic gait and somewhat antalgic gait.  She is  areflexic.  Remainder of her examination is normal.   IMPRESSION:  1. Syncope, 780.2.  2. Transient ischemic attack versus completed stroke, 435.8.  The      patient has had multiple strokes with nondominant hemiparesis,      438.12.  She had multiple risk factors of stroke as noted above.  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  Left lateral putamen, that is fairly large.  Left  anterior thalamus that is minute.  Left anterior limb of the  internal capsule and extending into the corona radiata.   Sodium 144, potassium 4.1, chloride 111, CO2 21, BUN 18, creatinine 0.8,  and glucose 61.  Coagulation time and CBC were not done and will be done  later.   The patient is not a candidate for tissue plasminogen activator because  her presentation was just under 3 hours, but potential stroke was not  recognized.  She does not fit any research protocol.  She will be  admitted to the hospital for MRI scan of the brain.  MRA intracranial,  noninvasive vascular workup and laboratory workup looking for treatable  causes of stroke.  I have reviewed her EKG and it shows regular sinus  rhythm.  Her NIH stroke scale was 0 at 11:40 p.m.      Deanna Artis. Sharene Skeans, M.D.  Electronically Signed     [facility]/MEDQ  D:  03/22/2008  T:  03/22/2008  Job:  [id number]

## 2011-03-30 NOTE — Assessment & Plan Note (Signed)
[facility]                          [facility]   [name], [name] [name]                    MRN:          [id number]  DATE:04/01/2009                            DOB:          Aug 04, 1951    REFERRING PHYSICIAN:  Prescott Mccann   HISTORY OF PRESENT ILLNESS:  The patient is a pleasant 60 year old  female with history of coronary artery disease status post current  artery bypass grafting in December 2007.  The patient denies any chest  pain.  She has no shortness of breath, orthopnea or PND.  She has no  palpitations or syncope.  She actually feels quite well.  Dr. Dyann Mccann is  managing her hyperlipidemia.   MEDICATIONS:  1. Metformin 500 mg 2 tablets p.o. b.i.d.  2. Metanx 1 tablet p.o. daily.  3. Plavix 75 mg p.o. daily.  4. Neurontin 300 mg p.o. b.i.d.  5. Aspirin 81 mg p.o. daily.  6. Lantus 20 units at bedtime.  7. Calcium.  8. Omeprazole.  9. Simvastatin 80 mg p.o. at bedtime.  10.Celexa 20 mg half a tablet p.o. daily.  11.Fosamax 70 mg every weekly.  12.Zantac 75 mg p.o. daily   PHYSICAL EXAMINATION:  VITAL SIGNS:  Blood 120/75, heart rate 69, and  weight 132 pounds.  GENERAL:  Well nourished white female, in no apparent test.  HEENT:  Pupils; Eyes are clear. Conjunctivae clear.  NECK:  Supple.  Normal carotid stroke and no carotid bruits.  LUNGS:  Clear breath sounds bilaterally.  HEART:  Regular rate and rhythm.  Normal S1 and S2.  No murmurs, rubs,  or gallops.  ABDOMEN:  Soft and nontender.  No rebound or guarding.  Good bowel  sounds.  EXTREMITIES:  No cyanosis, clubbing, or edema.   PROBLEM LIST:  1. Small vessel coronary artery disease.      a.     Status post coronary artery bypass grafting December 2007.      b.     Preserved left ventricular function.  2. History of hypertension, resolved.  3. Cerebrovascular disease followed by Dr. Pearlean Mccann.  4. History of lacunar strokes.  5. Ataxia, resolved.  6. Type 2  diabetes mellitus.  7. Dyslipidemia.   PLAN:  1. The patient is stable from cardiovascular standpoint.  No      medications change need to be done.  2. The patient can follow up with [place] in 1 year.     [name] Codding, MD,FACC  Electronically Signed    GED/MedQ  DD: 04/01/2009  DT: 04/02/2009  Job #: [id number]   cc:   [name]

## 2011-03-30 NOTE — Assessment & Plan Note (Signed)
[facility]                          [facility]   Traeh, Milroy CHARITY TESSIER                    MRN:          [id number]  DATE:10/30/2008                            DOB:          May 25, 1951    REASON FOR VISIT:  Scheduled followup.   Since last seen here in the clinic this past February, Ms. Dripps was  hospitalized at [facility] in May with syncope.  Her records indicate,  however, that the etiology was unclear.  She has known severe  cerebrovascular disease with prior history of lacunar strokes, but no  acute changes were noted.  She did have evidence, however, of severe  cavernous carotid stenosis.  She has since had regular followup with Dr  Pearlean Brownie and is being treated with combination of aspirin and Plavix.  Their understanding is that this lesion is not amenable to percutaneous  intervention or surgery, but will continue to be closely followed.   We also learned that Ms. Swaby has since been taken off of Pravachol,  per Dr. Virgina Organ, in light of most recent lipid profile data indicating  low cholesterol levels.  Her discharge from May did report a lipid  profile with a total cholesterol of 93, triglyceride 91, HDL 32, and LDL  43.  She previously was on pravastatin 40, which I had suggested to be  increased to 80 mg daily.  She is currently only on Niaspan.   Clinically, Ms. Anes denies any chest pain or significant exertional  dyspnea.  She does not smoke, although her husband does.   Ms. Reber also had a previous problem of ataxia, as well as syncope.  Her husband informs me today that ever since her daughter suggested that  the patient stopped taking diclofenac, the symptoms have since resolved.   Review of her chart also indicates that she was taken off lisinopril  when she was last seen here in the clinic, by Dr. Andee Lineman, following  report of orthostatic hypotension.   EKG in our office today indicates sinus bradycardia at  58 bpm with  normal axis and chronic right bundle-branch block.   CURRENT MEDICATIONS:  1. Aspirin 81 daily.  2. Plavix.  3. Metformin 1000 b.i.d.  4. Niaspan 500 at bedtime.  5. Metanx.  6. Neurontin 300 b.i.d.  7. Xanax 0.5 at bedtime.  8. Celexa.  9. Lantus 20 units at bedtime.   PHYSICAL EXAMINATION:  VITAL SIGNS:  Blood pressure 147/81, pulse 58,  regular, weight 128.  GENERAL:  [name] sitting upright in no distress.  HEENT:  Normocephalic, atraumatic.  NECK:  Palpable carotid pulses with soft left carotid bruit.  LUNGS:  Clear to auscultation in all fields.  HEART:  Regular rate and rhythm.  No significant murmurs.  No rubs.  ABDOMEN:  Benign.  EXTREMITIES:  No significant edema.  NEURO:  Flat affect, no focal deficit.   IMPRESSION:  1. Multivessel coronary artery disease.      a.     Three-vessel CABG, December 2007.      b.     Preserved LVF.  2. History of hypotension.  a.     Requiring discontinuation of lisinopril.  3. Severe cerebrovascular disease.      a.     Followed by Dr. Delia Heady.  4. History of lacunar strokes.  5. History of ataxia, since resolved.  6. Type 2 diabetes mellitus.  7. Dyslipidemia.   PLAN:  1. Surveillance fasting lipid profile for reassessment of lipid      status.  Pending review of this result, I will make the      determination as to whether or not the patient needs to be on some      maintenance dose of a statin.  I would certainly favor this,      however, given the severity of her underlying coronary artery and      cerebrovascular disease.  2. With respect to resuming an antihypertensive, I will defer doing so      at this time, given her history of orthostatic hypotension.      Although she clearly would benefit from being on an ACE inhibitor,      this had to be discontinued in the past secondary to relative      hypotension.  For the time being, I instructed them to closely      monitor her blood  pressure in the outpatient      setting and we will reassess this when she returns for followup.  3. Schedule return clinic followup with myself and Dr. Andee Lineman in 4      months.     Gene Serpe, PA-C  Electronically Signed      Learta Codding, MD,FACC  Electronically Signed   GS/MedQ  DD: 10/30/2008  DT: 10/31/2008  Job #: [id number]   cc:   Prescott Parma

## 2011-03-30 NOTE — Assessment & Plan Note (Signed)
[facility]                          [facility]   [name], Mavis KIKUE GERHART                    MRN:          [id number]  DATE:06/13/2007                            DOB:          08/03/1951    PRIMARY CARDIOLOGIST:  Dr. Lewayne Bunting.   REASON FOR VISIT:  Schedule clinic followup.   Since last seen here in the clinic by me in February of this year, the  patient presented to [facility] ER on June 26 with complaint of  nocturnal angina pectoris.  She was evaluated by Dr. Olga Millers who  felt that her symptoms were quite atypical and, after a set of normal  cardiac markers as well as negative POC enzymes, the patient was cleared  for discharge with plans to follow up with [place] for further  recommendations.  A D-dimer was also drawn and normal.  No medications  adjustments were made.  The patient presented today stating that she has  not had any recurrent chest discomfort.  She also continues to ambulate  and remain active both in and out of the home with no associated  symptoms.   The patient has, however, developed a recent intermittent dizziness  which lasts approximately 1 hour in duration.  There is no associated  syncope or fall, but there is vertigo-like sensation to this.  However,  this appears to occur only when standing and not in lying supine.  It  also seems to occur mostly in the morning, but before she takes any of  her medications.  The patient also seems to have some generalized  fatigue and diminished energy.   CURRENT MEDICATIONS:  1. Aspirin 81 mg daily.  2. Atenolol 25 mg nightly.  3. Pravastatin 40 mg nightly.  4. Metformin 500 mg b.i.d.  5. Lantus 24 units nightly.   PHYSICAL EXAMINATION:  Blood pressure 115/63, pulse 55, regular, weight  125.8.  GENERAL:  [name] sitting upright in no apparent distress.  HEENT:  Normocephalic, atraumatic.  NECK:  Palpable bilateral carotid pulses without  bruits; no JVD at 90  degrees.  LUNGS:  Clear to auscultation in all fields.  HEART:  Regular rate and rhythm (S1 and S2).  No significant murmurs.  ABDOMEN:  Benign.  EXTREMITIES:  No significant edema.  NEUROLOGIC:  Very flat affect, but no focal deficits.   IMPRESSION:  1. Intermittent dizziness.      a.     Question secondary to transient hypotension/bradycardia.  2. Multivessel coronary artery disease.      a.     Three-vessel coronary artery bypass graft, December 2007.  3. History of left ventricular dysfunction.      a.     Moderate by initial catheterization; improved to 55% by two-       dimensional echocardiogram, January 2008.  4. History of hypotension.      a.     Requiring discontinuation of lisinopril.  5. Hyperlipidemia.  6. Insulin-dependent diabetes mellitus.  7. Sinus bradycardia.   PLAN:  1. My initial intention is to down-titrate atenolol to 12.5 daily,  given that I suspect that this may be related to her recent      dizziness.  However, we will start by changing the dosing time from      evening to early morning, in the event that the patient may be      developing significant hypotension and bradycardia early in the      morning as a consequence of evening dose of atenolol.  She is to      therefore initially try this dosing change and then contact me      later this week for an update.  If she continues to have dizziness,      however, then we will go ahead and cut back the atenolol to 12.5 mg      daily.  I have also instructed her to check blood pressure and      pulse during these dizzy episodes and to report these to me as      well.  2. Schedule return clinic followup with myself and Dr. Andee Lineman in      approximately 2 weeks.      Gene Serpe, PA-C  Electronically Signed      Learta Codding, MD,FACC  Electronically Signed   GS/MedQ  DD: 06/13/2007  DT: 06/14/2007  Job #: [id number]   cc:   Prescott Parma

## 2011-03-30 NOTE — Assessment & Plan Note (Signed)
[facility]                          [facility]   NAME:[name], [name]                    MRN:          [id number]  DATE:03/08/2008                            DOB:          1951-01-22    HISTORY OF PRESENT ILLNESS:  Patient with known coronary artery disease,  status post coronary artery bypass grafting.  The patient previously  suffered a right lacunar stroke with associated ataxia.  The patient was  sent to [facility] for further evaluation.  She has an MRI  scheduled.  They felt also the patient might have orthostatic  hypotension.  The patient from a cardiac perspective, however, is doing  well and she reports no chest pain, shortness of breath, orthopnea or  PND.   MEDICATIONS:  Aspirin 81 mg a day, pravastatin, diclofenac, Plavix,  metformin, Lantus, lisinopril and alprazolam.   REVIEW OF SYSTEMS:  Of note, the patient does complain of burning in the  lower extremities, consistent with possible peripheral polyneuropathy  related to her diabetes.  She was started on Neurontin by the  neurologist.   PHYSICAL EXAMINATION:  VITAL SIGNS:  Blood pressure 112/71, heart rate  74, weight 177 pounds.  NECK:  Normal carotid upstroke.  No carotid bruits.  LUNGS:  Clear breath sounds bilaterally.  HEART:  Regular rate and rhythm.  Normal sinus rhythm.  No murmurs or  gallops.  ABDOMEN:  Soft, nontender.  No rebound, tenderness or guarding.  Good  bowel sounds.  EXTREMITIES:  No cyanosis, clubbing or edema   PROBLEM LIST:  1. Coronary artery disease, status post coronary artery bypass      grafting, stable.  2. Normal LV function.  3. History of hypotension requiring discontinuation of lisinopril.  Of      note, the patient is currently still on lisinopril.  4. Right lacunar stroke and ataxia, followed by neurology.  5. Dyslipidemia.  6. Type 2 diabetes mellitus.   PLAN:  The patient with orthostatic blood pressures  done.  The  lisinopril will be discontinued.  This should have been done previously,  and I am not sure why this did not happen.  The patient has follow-up  with neurology for an MRI.     Learta Codding, MD,FACC  Electronically Signed    GED/MedQ  DD: 03/08/2008  DT: 03/08/2008  Job #: [id number]

## 2011-03-30 NOTE — Procedures (Signed)
EEG NUMBER:  [id number].   HISTORY:  This is a 60 year old patient who was admitted with syncope,  is being evaluated for possible seizure-type events.  The patient has a  history of coronary artery disease, hypertension, diabetes, and  cerebrovascular disease.   MEDICATIONS:  Include:  1. Lovenox.  2. Lantus insulin.  3. Diflucan.  4. Zocor.  5. Neurontin.  6. Xanax.  7. Glucophage.  8. Plavix.  9. Reglan.   EEG CLASSIFICATION:  Normal awake and drowsy.   DESCRIPTION OF RECORDING:  Background rhythm of this recording consists  of a fairly well-modulated, medium-amplitude alpha rhythm of 9 Hz that  is reactive to eye opening and closure.  As the record progresses,  photic stimulation is performed resulting in bilateral and symmetric  photic driving response.  Hyperventilation is also performed resulting  in a minimal buildup of the background rhythm activities without  significant slowing seen.  At no time during the record, did  there  appear to be evidence of spikes, spike wave discharges, or evidence of  focal slowing.  At times during the recording, the patient does appear  to enter the drowsy state with some generalized slowing seen, but the  patient never entered stage II sleep.  Again, no cardiac rhythm  abnormalities were seen with a heart rate of 72.   IMPRESSION:  This is a normal EEG recording in awake and drowsy state.  No evidence of ictal or interictal discharges were seen.      Marlan Palau, M.D.  Electronically Signed     [id number]  D:  03/25/2008 15:17:01  T:  03/26/2008 02:11:20  Job #:  [id number]

## 2011-03-30 NOTE — Consult Note (Signed)
[name], [name] NO.:  [id number]   MEDICAL RECORD NO.:  [id number]          PATIENT TYPE:  EMS   LOCATION:  [facility]                         FACILITY:  [facility]   PHYSICIAN:  Madolyn Frieze. Jens Som, MD, FACCDATE OF BIRTH:  1951-02-28   DATE OF CONSULTATION:  05/11/2007  DATE OF DISCHARGE:                                 CONSULTATION   SUMMARY OF HISTORY:  [name] is a 60 year old white female who  presented to [facility] emergency room on May 11, 2007, at  approximately 3:30 complaining of chest discomfort.  She stated that  while watching TV at 2:30 a.m., she suddenly developed an anterior chest  hurting.  This did not radiate nor was it associated with nausea,  vomiting, shortness of breath or diaphoresis.  She gave it a 5 on a  scale of 0-10.  The discomfort abated on its own within 10 minutes.  She  denies tenderness, pleuritic chest discomfort, or change with movement.  Throughout the day she has not had any further episodes; however, she  has noticed twinges in her chest that last a second or two.  She has  never had this discomfort before.  Her husband adds that she was doing a  lot of yard work yesterday with a Restaurant manager, fast food.   PAST MEDICAL HISTORY:  No known drug allergies.   Medications prior to the medications include:  1. Lantus on a sliding scale basis.  2. Pravastatin.  3. Aspirin 81 mg.  4. Nitroglycerin 0.4 mg.  5. Atenolol.   Past medical history is notable for:  1. Hyperlipidemia.  2. Hypertension.  3. Diabetes x25 years.  4. She is a history of bypass surgery on November 10, 2006, with a      LIMA to the LAD, a saphenous vein graft to the circumflex,      saphenous vein graft to the PDA.  Prior to her bypass she underwent      a stress Myoview for chest discomfort and exertional dyspnea.  EF      was 34% and was abnormal for ischemia.  Catheterization on October 27, 2006, showed three-vessel coronary artery disease with moderate      LV  dysfunction, thus her bypass.  Subsequent echocardiogram,      specific report is not available.  She is reported having an EF of      55%.   Surgical history is notable for cholecystectomy.   SOCIAL HISTORY:  She resides in [place] with her husband.  She has five  children.  She is retired.  She has never smoked.  She denies alcohol,  drugs or herbal medications.  She walks 3-4 miles a day without  difficulty.  Her mother died at the age of 8 with a CVA, her father at  the age of 35 with sudden death.  She has one sister.  Two brothers are  deceased.  Three brothers living.  One has a history of coronary artery  disease.   REVIEW OF SYSTEMS:  In addition to the above, notable for dentures,  reading glasses, right  hearing loss, rare pedal edema, nocturia,  postmenopausal, numbness in her feet, bilateral shoulder arthralgias.   PHYSICAL EXAMINATION:  GENERAL:  A petite white female in no apparent  distress.  VITAL SIGNS:  Temperature is 98.3, blood pressure 144/74, pulse 53  regular, respirations 18.  HEENT:  Unremarkable.  NECK:  Supple without thyromegaly, adenopathy, JVD or carotid bruits.  CHEST:  Symmetrical excursion.  Clear to auscultation.  HEART:  PMI is not displaced.  Regular rate and rhythm.  Incisions  appear to be well-healed.  ABDOMEN:  Unremarkable.  EXTREMITIES:  Negative cyanosis, clubbing or edema.  Peripheral pulses  are symmetrical and intact.   Chest x-ray in the emergency room showed no active disease.  EKG showed  sinus bradycardia and nonspecific changes.  Point of care markers were  negative x1.  D-dimer 0.38.  H&H was 13.3 and 39, potassium 4.6.  Once  CK-MB and troponin are drawn that will be called made these are within  normal limits.  She will be discharged home with the above plan.  If  they are abnormal, she will be admitted for further observation.   IMPRESSION:  1. Vague atypical chest discomfort.  2. History as listed per past medical  history.   DISPOSITION:  Dr. Jens Som reviewed the patient's history, spoke with  and examined the patient, and agrees with the above.  We will check a  CK, MB and a troponin in the emergency room.  If these are negative, we  will discharge her home to follow up with Dr. Andee Lineman in [place] and  consider outpatient Myoview.  She was asked to continue her cardiac  medications.  Once CK, MB and troponin are drawn, they will be called to  me.  If these are within normal limits, she will be discharged home with  the above plan.  If they are abnormal, she will be admitted for further  observation.      Joellyn Rued, PA-C      Madolyn Frieze Jens Som, MD, [facility]  Electronically Signed    EW/MEDQ  D:  05/11/2007  T:  05/12/2007  Job:  [id number]   cc:   Learta Codding, MD,FACC  Salvatore Decent. Cornelius Moras, M.D.  [place]

## 2011-04-02 NOTE — H&P (Signed)
NAMEJAHNI, PAUL NO.:  [id number]   MEDICAL RECORD NO.:  [id number]           PATIENT TYPE:   LOCATION:                               FACILITY:  [facility]   PHYSICIAN:  Salvatore Decent. Cornelius Moras, M.D. DATE OF BIRTH:  Oct 30, 1951   DATE OF ADMISSION:  11/10/2006  DATE OF DISCHARGE:                              HISTORY & PHYSICAL   PRESENTING CHIEF COMPLAINT:  Exertional chest discomfort and shortness  of breath.   HISTORY OF PRESENT ILLNESS:  Ms. Cocke is a 60 year old married white  female from [place], [place] with no previous history of coronary  artery disease but risk factors notable for longstanding history of type  2 diabetes mellitus, hyperlipidemia and a family history of coronary  artery disease.  She has never been a smoker but apparently she has been  exposed to secondhand smoke for a good portion of her life.  Ms. Hulon  reports that she had been in her usual state of health until September  of this year at which time she developed new onset of epigastric chest  and upper abdominal discomfort associated with shortness of breath.  She  was evaluated at [facility] and reportedly found to have  cholecystitis.  She underwent cholecystectomy.  The records from that  hospitalization are not currently available, but her recovery was slow  and she was hospitalized for several days.  She states that since then  she has never felt well.  She has had continued episodes of intermittent  epigastric chest discomfort associated with shortness of breath.  For  the most part, these are associated with physical activity and relieved  by rest.  She has had significant fatigue.  She was recently evaluated  by Dr. Andee Lineman and she underwent a stress test which was markedly  abnormal.  She was noted to have reduced left ventricular function at  baseline with ejection fraction 34%.  Radionucleotide imaging was  suggestive of ischemia in the anteroseptal and apical wall  as well as in  the inferior wall.  She was seen in consultation by Dr. Andee Lineman and  subsequently underwent elective cardiac catheterization by Dr. Excell Seltzer on  Wrigley, 2007.  She was found to have severe three-vessel coronary  artery disease with moderate left ventricular dysfunction.  Ms. Mavis  has now been referred for possible surgical revascularization.   REVIEW OF SYSTEMS:  GENERAL:  The patient has not had a good appetite  for the last several months and apparently lost nearly 30 pounds in  weight beginning at the time of cholecystectomy in September.  She does  report that over the last few weeks she has been doing better.  Her  appetite has improved and she has gained back approximately 10 pounds.  Her energy level is still not good but it is improving.  CARDIAC:  The  patient describes exertional chest discomfort which is described as a  tightness in the epigastric region that is associated with shortness of  breath.  This is typically brought on with physical activity and  relieved within 10-15 minutes of rest.  These episodes are  sometimes  brought on with stress.  She denies any prolonged episodes of chest  discomfort other than that which developed immediately prior to her  cholecystectomy.  She denies any nocturnal chest pain.  She denies any  PND, orthopnea, syncope or palpitations.  She has a long history of  intermittent mild bilateral lower extremity edema.  This has not gotten  worse recently.  RESPIRATORY:  Notable for exertional shortness of  breath.  The patient denies productive cough, hemoptysis, wheezing.  GASTROINTESTINAL:  Notable for some problems with constipation.  The  patient also has occasional small flecks of bright red blood in her  stool that has been attributed to hemorrhoids.  She denies difficulty  swallowing.  She denies hematemesis or melena.  She has no symptoms of  reflux.  GENITOURINARY:  Negative.  The patient denies urgency or  frequency.   PERIPHERAL VASCULAR:  Notable for some pain in both feet  when she is lying flat.  She denies any pain in her legs with walking  suspicious for claudication.  She denies history of lower extremity  phlebitis or deep vein thrombosis.  NEUROLOGIC:  Notable only for  occasional headaches.  The patient denies transient monocular blindness  or transient numbness or weakness involving either upper or lower  extremity.  MUSCULOSKELETAL:  Negative.  PSYCHIATRIC:  Negative.  HEENT:  Negative.   PAST MEDICAL HISTORY:  1. Type 2 diabetes mellitus, now insulin dependent.  The patient      states that her blood sugars have been running high for several      months but have been gradually getting better over the last few      weeks.  She admits that they are still not under good control.  She      typically checks her blood sugars twice daily.  She does not know      what her most recent hemoglobin A1c value was.  2. Hyperlipidemia.  3. Status post cholecystectomy September2007.  4. Recent weight loss following cholecystectomy and prolonged      convalescence.   FAMILY HISTORY:  Multiple family members with coronary artery disease  including a brother who had heart attack at age 1.   SOCIAL HISTORY:  The patient is married and lives with her husband.  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  She is a nonsmoker, but she has been exposed to second hand  smoke.  She denies alcohol consumption.   CURRENT MEDICATIONS:  1. Lantus insulin 22 units every evening at bedtime.  2. Lipitor 10 mg daily.  3. Metoprolol 25 mg twice daily.  4. Metformin 500 mg twice daily.  5. Isosorbide mononitrate 30 mg once daily.  6. Aspirin 81 mg daily.  7. Lyrica 50 mg once daily for chronic leg pain.  8. Nitroglycerin 0.4 mg sublingual as needed (never used).   DRUG ALLERGIES:  None known.  PHYSICAL EXAM:  The patient is a thin white female who appears somewhat  older  than stated age but is in no acute distress.  Blood pressure is  128/64, pulse 68 regular, oxygen saturation is 98% on room air.  HEENT  exam is grossly unremarkable.  The neck is supple.  There is no cervical  nor supraclavicular lymphadenopathy.  There are no jugular venous  distension.  No carotid bruits are noted.  Auscultation of the chest  reveals clear breath sounds which are symmetrical bilaterally.  No  wheezes or rhonchi noted.  Cardiovascular exam includes regular rate and  rhythm.  No murmurs, rubs or gallops are appreciated.  The abdomen is  soft, nontender.  There are several small incisions from laparoscopic  cholecystectomy which have all healed nicely.  The extremities are warm  and well-perfused.  There is trivial bilateral lower extremity edema.  Distal pulses are palpable in the dorsalis pedis position.  There are no  skin lesions or ulcerations.  Rectal and GU exams are both deferred.  Neurologic examination is grossly nonfocal and symmetrical throughout.   DIAGNOSTIC TESTS:  Cardiac catheterization performed by Dr. Excell Seltzer  yesterday is reviewed.  This revealed diffuse coronary artery disease  with findings typical for longstanding diabetes.  There is severe three-  vessel coronary artery disease with moderate left ventricular  dysfunction.  Specifically, there is 80% proximal stenosis of the left  anterior descending coronary artery with 80% stenosis of the mid-left  anterior descending coronary artery and 80% stenosis of the distal left  anterior descending coronary artery.  This distal vessel is diffusely  diseased but does appear to be graftable target.  There is 80% proximal  stenosis of a large first circumflex marginal branch.  There is 60-70%  stenosis of the distal left circumflex coronary artery just after  takeoff of the circumflex marginal branch.  The distal left circumflex  coronary artery gives rise to two small posterolateral branches.  There  is  right-dominant coronary circulation.  There are 80% and 90% lesions  in the proximal right coronary artery with 100% occlusion of the distal  right coronary artery.  There is faint left-to-right collateral filling  of a small posterior descending coronary artery which comes off of the  distal right coronary artery.  Left ventricular function is moderately  reduced with ejection fraction estimated 35%.  There is inferior wall  moderate hypokinesis and mild global hypokinesis.   IMPRESSION:  Severe three-vessel coronary artery disease with moderate  left ventricular dysfunction and chronic symptoms of exertional angina:  I believe that Ms. Pelle's coronary artery disease would best be treated  with surgical revascularization.  She will be at somewhat increased risk  for perioperative complications due to her degree of left ventricular  dysfunction as well as the diffuse nature of her coronary artery disease with relatively poor targets for grafting.  I am also concerned about  her strength as she appears fairly weak here in the office today and it  sounded like it has taken months for her to get over her recent  cholecystectomy.  She lost a lot of weight following this procedure,  although she is starting began back at this point in time.  She has had  trouble getting her diabetes under control.   PLAN:  We will send Ms. Sarr for some blood work today to include a  complete blood count, comprehensive metabolic panel, hemoglobin A1c and  a clean catch urinalysis.  If all of these test results are normal, I  think it would be reasonable to consider proceeding with surgery at some  point within the next few weeks as long as Ms. Obremski continues to  improve.  We will tentatively plan for surgery on Wednesday  December27, 2007.  I have outlined at length the indications, risks,  and potential benefits of surgery with Ms. Insco and her family.  Alternative treatment strategies have been  discussed.  They understand  and accept all associated risks of surgery including but not  limited to  risk of death, stroke, myocardial infarction, congestive heart failure,  respiratory failure, pneumonia, bleeding requiring blood transfusion,  arrhythmia, infection and recurrent coronary artery disease.  All their  questions have been addressed.      Salvatore Decent. Cornelius Moras, M.D.  Electronically Signed     CHO/MEDQ  D:  10/28/2006  T:  10/29/2006  Job:  [id number]   cc:   Learta Codding, MD,FACC  Veverly Fells. Excell Seltzer, MD

## 2011-04-02 NOTE — Assessment & Plan Note (Signed)
[facility]                          [facility] NOTE   [name], [name] [name]                    MRN:          [id number]  DATE:12/12/2006                            DOB:          09/20/1951    REFERRING PHYSICIAN:  [place] Mccann   HISTORY OF PRESENT ILLNESS:  The patient is a 60 year old female who  underwent recent exercise stress study which was positive for ischemia.  She also was found to LV dysfunction.  She was referred for cardiac  catheterization and was found to have multivessel coronary artery  disease.  The patient underwent coronary artery bypass grafting x3 with  a LIMA to the LAD and a vein graft circumflex as well as saphenous vein  graft to posterior descending artery.  The patient now presents for  followup.  Overall, she has been doing quite well.  She reports no chest  pain. She has mild dyspnea but has improved from preop. She did have an  episode of nausea yesterday.  She has been feeling some dizziness but  has not had true presyncope.  Reportedly she had small pleural effusions  by chest x-ray, but on physical examination today, her lungs sound  rather clear.   MEDICATIONS:  1. Lantus insulin 24 units nightly.  2. Isosorbide 30 mg p.o. daily.  3. Metformin 500 mg p.o. b.i.d.  4. Lipitor 20 mg 1/2 tablet daily.  5. Aspirin 81 mg daily.  6. Metoprolol 25 mg 1 tablet p.o. b.i.d.  7. Lisinopril 10 mg 1/2 tablet p.o. daily.  8. Toprol XL 50 mg 1/2 tablet p.o. daily.   PHYSICAL EXAMINATION:  VITAL SIGNS: Blood pressure 92/60, heart rate 72  beats per minute, respiratory rate 18.  NECK:  Normal carotid upstroke, no carotid bruits.  LUNGS: Clear breath sounds bilaterally.  HEART: Regular rate and rhythm with normal S1, S2.  No murmur, rubs, or  gallops.  Distant heart sounds.  ABDOMEN: Soft, nontender.  No rebound or guarding.  Good bowel sounds.  EXTREMITIES:  No cyanosis, clubbing, or edema.  NEUROLOGIC: To is  alert and oriented, grossly nonfocal.   PROBLEM LIST:  1. Coronary artery disease status post coronary artery bypass      grafting.  See details above.  2. Mild left ventricular dysfunction, ejection fraction 49%.  3. Dizziness with relatively low blood pressure.  4. Nausea.  Rule out diabetic gastroparesis.  5. Rule out pleural effusions.   PLAN:  1. The patient's x-ray will be reviewed which was done two weeks ago      reportedly.  2. The patient needs to have her appointment rescheduled at [facility] which      was cancelled because cardiology and [facility] appointments were      obtained on the same day.  3. I suspect the patient has some diabetic gastroparesis and started      Reglan 10 mg p.o. 3 times a day.  4. I have also ordered echocardiographic study to make sure the      patient does not have another pericardial effusion.  5. Laboratory work will be done including a CBC,  BMET, and liver      function test.  6. The patient will follow up with me in the next couple of weeks.  7. The patient also will be scheduled for cardiac rehab.     [name] Codding, MD,FACC  Electronically Signed    GED/MedQ  DD: 12/12/2006  DT: 12/12/2006  Job #: [id number]   cc:   [name]

## 2011-04-02 NOTE — Assessment & Plan Note (Signed)
[facility]                          [facility] OFFICE NOTE   NAME:Griswold, MADYLYN INSCO                    MRN:          [id number]  DATE:10/24/2006                            DOB:          Mar 24, 1951    REASON FOR CONSULTATION:  Abnormal stress test.   [name] is a 60 year old female, with no prior cardiac history, now  referred for further evaluation of a recent stress test, which was  markedly abnormal. The patient was referred for an exercise stress test,  utilizing modified Bruce protocol, for further evaluation of new onset  exertional dyspnea and chest discomfort. The study was abnormal with  reduced LV function at 34% with global hypokinesis and the images,  reviewed by Dr. Diona Browner, were suggestive of ischemia in the  anteroseptal/apical distribution as well as in the inferior  distribution. It was felt that this was consistent with underlying  coronary artery disease and potentially cardiomyopathy.   The patient reports development of these symptoms, which are altogether  new, shortly after coming home from hospitalization this September here  at [facility] where she underwent cholecystectomy. The records pertaining  this hospitalization are currently unavailable. Nevertheless, the  patient reports that she presented with significant epigastric pain and  associated nausea and vomiting and underwent subsequent cholecystectomy.  She, apparently, had post-op complications and was not discharged until  several days later.   Only a few days after coming home, however, the patient noted a  significant dyspnea with minimal exertion and this has persisted over  these past several months. She also has associated chest pain, albeit  sharp in nature, but which resolved with rest after approximately 15  minutes. She has significant fatigue with any significant activity or  even mild exertion.   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   The patient does have cardiac risk factors. These are notable for a  longstanding history of insulin-dependent diabetes mellitus,  hyperlipidemia, and family history of premature coronary artery disease.  She denies any history of hypertension and has never smoked tobacco, but  is exposed to significant second-hand smoke.   A 12-lead electrocardiogram in our office today reveals an NSR at 79 BPM  with normal axis and ST flattening in the high lateral/lateral leads.   ALLERGIES:  No known drug allergies.   CURRENT MEDICATIONS:  1. Lantus 24 units nightly.  2. Isosorbide 30 mg daily.  3. Metformin 500 mg b.i.d.  4. Lipitor 10 mg daily.  5. Aspirin 81 mg daily.  6. Lyrica.   PAST MEDICAL HISTORY:  1. Insulin-dependent diabetes mellitus.      a.     Times 26 years.  2. Hyperlipidemia.  3. Status post laparoscopic cholecystectomy September 2007.   REVIEW OF SYSTEMS:  The patient denies any prior history of myocardial  infarction, congestive heart failure, syncope or stroke. She denies any  symptoms suggestive of reflux disease. Denies orthopnea or PND, but has  a prior history of  mild lower extremity edema. She also complains of her  legs hurting at night, but denies any symptoms suggestive of  intermittent claudication. Denies any significant palpitations.  Otherwise, as noted in HPI. Remaining systems negative.   SOCIAL HISTORY:  The patient is married. Has a total of 5 children, 2 of  whom are adopted. She has never smoked tobacco and denies alcohol use.   FAMILY HISTORY:  Father died at age 6, sudden death. Mother died at 92,  complications of stroke. Brother age 26, diagnosed with coronary artery  disease.   PHYSICAL EXAMINATION:  Blood pressure is 120/66, pulse is 79, regular.  Weight is 128.  GENERAL: [name] with pallid  appearance, sitting upright in  no apparent distress.  HEENT: Normocephalic, atraumatic.  NECK: Palpable bilateral carotid pulses without bruits; no JVD.  LUNGS:  Clear to auscultation in all fields.  HEART: Regular rate and rhythm (S1, S2). A soft grade 2/6 holosystolic  murmur extending from the base down to the apex and to the pre-left  axillary line; no diastolic blow.  ABDOMEN: Soft, nontender with intact bowel sounds and no bruits.  EXTREMITIES: Palpable femoral pulses without bruits; intact distal  pulses with 1+ lower extremity edema.  NEURO: Flat affect, but no focal deficit.   IMPRESSION:  1. Cardiomyopathy.      a.     Presumed ischemic; ejection fraction 34% by recent exercise       stress Cardiolite.      b.     Associated exertional dyspnea/chest discomfort.  2. Insulin-dependent diabetes mellitus.  3. Hyperlipidemia.  4. Systolic murmur.  5. Status post laparoscopic cholecystectomy September 2007.      a.     Secondary to acute cholecystitis complicated by bacteremia.   PLAN:  Recommendation is to proceed with further workup consisting of a  diagnostic coronary angiogram, which will be scheduled within the next 1  to 2 days in the [facility]. The patient is agreeable with  this plan and the risks/benefits have been discussed. The patient has  also been seen and examined in conjunction with Dr. Lewayne Bunting, who  approves of this plan and who also notes that the patient may  subsequently need a followup 2D echocardiogram to exclude significant  valvular heart disease as well as possibility of endocarditis. It  appears that her recent post-hospital course was complicated by  bacteremia with Klebsiella pneumonia requiring treatment with IV  antibiotics. Regarding medications, the patient will continue on current  medication regimen, with the addition of a beta-blocker which will be started today, and the patient will also be provided for a prescription  for  nitroglycerine. Metformin will be placed on hold.      Gene Serpe, PA-C  Electronically Signed      Learta Codding, MD,FACC  Electronically Signed   GS/MedQ  DD: 10/24/2006  DT: 10/24/2006  Job #: [id number]

## 2011-04-02 NOTE — Assessment & Plan Note (Signed)
[name]                          [facility]ICE NOTE   [name], [name]                    MRN:          [id number]  DATE:01/03/2007                            DOB:          12/21/1950    Primary cardiologist:  Dr. Lewayne Bunting.   REASON FOR OFFICE VISIT:  Scheduled 2-week followup.  Please refer to  Dr. Margarita Mail clinic note of January 28 for further details.   Since last seen, the patient continues to gain strength following  multivessel bypass surgery in December 2007.  At the time of her last  visit, she was referred for a 2D echocardiogram to rule out pericardial  effusion and there was no evidence of such on echo.  Also of note,  ejection fraction had improved to 55% from previous assessment of  moderate LVD by cardiac catheterization.  Of note, there is residual  inferior akinesis consistent with prior infarct/scar.   Extensive blood work was also drawn, revealing normal renal function,  electrolytes and liver enzymes.  A CBC was normal as well.   A followup chest x-ray was reviewed, suggestive of a very small left  pleural effusion.   Symptomatically, the patient is ambulating as tolerated and reports only  occasional, fleeting chest pain across the upper chest.  She denies any  significant exertional dyspnea, nor any orthopnea or lower extremity  edema.   CURRENT MEDICATIONS:  1. Lantus 24 units nightly.  2. Metformin 500 b.i.d.  3. Lipitor 10 daily.  4. Aspirin 81 daily.  5. Toprol XL 25 daily.  6. Iron 65 mg b.i.d.   PHYSICAL EXAMINATION:  Blood pressure 105/64, pulse 63, regular.  Weight  124.6.  GENERAL:  [age]-year-old female, sitting upright in no distress.  HEENT:  Normocephalic, atraumatic.  NECK:  Palpable bilateral carotid pulses without bruits; no JVD.  LUNGS:  Clear to auscultation all fields.  HEART:  Regular rate and rhythm (S1, S2), no significant murmurs.  SKIN:  Well-healing incisions with  a small protruding suture in the  right upper chest region with no evidence of localized inflammation or  infection.  EXTREMITIES:  Intact distal pulses, there is no significant edema.  NEUROLOGIC:  No focal deficits.   IMPRESSION:  1. Multivessel coronary artery disease.      a.     Status post 3-vessel coronary artery bypass graft December       2007:  Left internal mammary artery - left anterior descending;       saphenous vein graft - obtuse marginal branch; saphenous vein       graft - posterior descending artery.      b.     Moderate left ventricular dysfunction by catheterization:       Improved to ejection fraction of 55% by current 2D echocardiogram.  2. Hypotension.      a.     Stable following recent discontinuation of lisinopril.  3. Small residual left pleural effusion.  4. Hyperlipidemia.  5. Nausea - resolved.   PLAN:  1. Repeat 2-view chest x-ray for a followup and exclusion of      persistent  left pleural effusion.  The patient will then take films      with her for a scheduled followup with Dr. Cornelius Moras early next week.  2. Continue current medication regimen.  Changed Lipitor to      pravastatin 40 mg daily and Toprol 25 to atenolol 25 mg daily,      given the patient's significant financial restraints.  3. Return clinic followup with myself and Dr. Andee Lineman in 3 months.      Gene Serpe, PA-C  Electronically Signed      Learta Codding, MD,FACC  Electronically Signed   GS/MedQ  DD: 01/03/2007  DT: 01/04/2007  Job #: [id number]   cc:   Salvatore Decent. Cornelius Moras, M.D.

## 2011-04-02 NOTE — Cardiovascular Report (Signed)
NAMEGIULIETTA, [name] NO.:  [id number]   MEDICAL RECORD NO.:  [id number]          PATIENT TYPE:  OIB   LOCATION:  1962                         FACILITY:  [facility]   PHYSICIAN:  Veverly Fells. Excell Seltzer, MD  DATE OF BIRTH:  Jan 31, 1951   DATE OF PROCEDURE:  10/27/2006  DATE OF DISCHARGE:                            CARDIAC CATHETERIZATION   PROCEDURE:  1. Left heart catheterization.  2. Selective coronary angiography.  3. Left ventricular angiography.  4. Left subclavian angiography.   INDICATION:  [name] is a 60 year old woman who underwent an exercise  Myoview stress study for evaluation of chest pain and shortness of  breath that indicated reduced left ventricular function with global  hypokinesia, as well as ischemia in the anteroseptum, apex and inferior  walls.  She was referred for cardiac catheterization in the setting of  her high-risk stress study.   PROCEDURAL DETAILS:  Risks and indications of the procedure were  explained in detail to the patient.  Informed consent was obtained.  The  right groin was prepped, draped and anesthetized with 1% lidocaine.  Using the modified Seldinger technique, a 4-French sheath was placed in  the right femoral artery.  Multiple views of the left and right coronary  arteries were taken.  For the left coronary artery, a 4-French JL-4  catheter was used.  For the right coronary artery a 4-French 3-DRC  catheter was used.  Following selective coronary angiography,  The 3-DRC  catheter was inserted into the left subclavian artery, and a left  subclavian angiogram was performed to assess the adequacy of the left  internal mammary artery for a potential bypass.  A 4-French angled  pigtail catheter was inserted into the left ventricle and pressures were  recorded.  A left ventriculogram was done.  A pullback across the aortic  valve was performed.  At the conclusion of the case, the sheath was  pulled and manual pressure used for  hemostasis.  All catheter exchanges  were performed over a guidewire.   FINDINGS:  Aortic pressure 152/74 with a mean of 107, left ventricular  pressure 155/10 with an end-diastolic pressure of 15.   CORONARY ANGIOGRAPHY:  The left mainstem is angiographically normal.  It  is a short segment that bifurcates into the LAD and left circumflex.   The LAD is heavily diseased.  It courses down to the left ventricular  apex.  The LAD has serial stenoses through its proximal portion with a  proximal 80% stenosis and a second 80% stenosis just beyond that.  The  LAD is mildly calcified throughout that portion.  Following the two  stenotic lesions, there is a septal perforating branch, and then beyond  the septal perforator there is diffuse severe disease throughout the  remainder of the mid and distal LAD.  There are lesions of 60% stenosis  in 80% stenosis through the true portions of the mid-LAD, and the distal  LAD is diffusely diseased and small diameter.  There is a small mid  diagonal branch that is diffusely diseased, as well.   The left circumflex is large caliber.  It  is mildly calcified and has  50% disease in its proximal portion.  It gives off a large first obtuse  marginal branch that has an 80% stenosis in its ostium.  The marginal  branch then has no significant angiographic disease throughout the  remainder of its course.  There is a branch vessel coming off the  marginal that has a 90% stenosis, but it is a very small diameter  vessel.  The left circumflex just beyond the marginal branch has a 70%  stenosis.  It then courses down the atrioventricular groove and gives  off two posterolateral branches that both have diffuse nonobstructive  disease.   The right coronary artery is dominant.  It is heavily diseased  throughout its proximal and midportions and then is 100% occluded in the  proximal portion of the distal vessel.  There is one acute marginal  branch of significance.   The proximal and midportions of the right  coronary artery have serial 90% lesions.  The PDA fills from left-sided  collaterals.   LEFT SUBCLAVIAN ANGIOGRAPHY:  The left subclavian angiography  demonstrates a widely patent left subclavian artery.  There is a large  vertebral, as well as a suitable left internal mammary artery for a  potential bypass conduit.   LEFT VENTRICULOGRAM:  The left ventriculogram performed in the 30  degrees right anterior oblique projection demonstrates moderate  segmental left ventricular dysfunction with hypokinesis of the apex and  severe hypokinesis of the mid inferior wall.  The basal portion of the  anterior wall is normal, and the midportion of the anterior wall has  subtle hypokinesis.   ASSESSMENT:  1. Severe native three-vessel coronary artery disease.  2. Moderate segmental left ventricular dysfunction.   DISCUSSION:  I will review the films further with colleagues.  However,  the patient has severe disease with the presence of LV dysfunction in  the setting of underlying diabetes.  I think she would best be treated  with coronary bypass surgery.  Unfortunately, her LAD target is  suboptimal due to a small vessel and diffuse disease throughout the mid  and distal portions of the vessel.  Her obtuse marginal target is good,  as it is a large-caliber vessel with very little disease in its mid and  distal portions.  The posterolateral branches of the left circumflex  were also fairly small but may be reasonable targets.  The right  coronary artery is diffusely diseased, and I am not sure that her PD  branch which fills from left-sided collaterals will be a good target.  I  will ask for one of the surgeons in the CVTS group to review her films  and give [place] their opinion.  In the meantime, I will review the patient's  medical therapy a make sure that she is on an appropriate anti-ischemic  regimen.      Veverly Fells. Excell Seltzer, MD Electronically  Signed     MDC/MEDQ  D:  10/27/2006  T:  10/27/2006  Job:  [id number]   cc:   Learta Codding, MD,FACC

## 2011-04-02 NOTE — Op Note (Signed)
[name], [name] NO.:  [id number]   MEDICAL RECORD NO.:  [id number]          PATIENT TYPE:  INP   LOCATION:  2307                         FACILITY:  [facility]   PHYSICIAN:  Burna Forts, M.D.DATE OF BIRTH:  1951-08-14   DATE OF PROCEDURE:  11/10/2006  DATE OF DISCHARGE:                               OPERATIVE REPORT   ANESTHESIOLOGIST:  Burna Forts, M.D.   PROCEDURE:  Intraoperative transesophageal echocardiogram.   INDICATIONS FOR PROCEDURE:  [name] is a 60 year old female patient  of Dr. Tressie Stalker, who presented today for coronary artery bypass  grafting.  We have been asked to place a TEE probe for evaluation of  cardiac structures and function both pre and post bypass.   DESCRIPTION OF PROCEDURE:  The patient was brought to the holding area,  where under routine sedation and local anesthesia, ulnar arterial and  radial arterial lines were started.  She was taken to the OR for routine  induction of general anesthesia.  The TEE probe was then lubricated,  protected and passed oropharyngeally into the stomach, and then  withdrawn slightly for imaging of the cardiac structures.   PRE-CARDIOPULMONARY BYPASS TEE EXAMINATION:  Left ventricle:  This is a  good overall left ventricular chamber seen in the short-axis view.  There is some mild hypocontractility noted, especially in the inferior  and the posterior walls.  There is excellent to good contractility noted  anteriorly, anteroseptally and in the septal wall area.  There is only  mild left ventricular dysfunction noted.  Papillary muscles are well  outlined.  Long-axis views are obtained again, revealing essentially  identical hypocontractility noted inferiorly.   Mitral valve:  This is a thickened mitral valve apparatus; however, it  appears to coaptation appropriately just below the level of the mitral  annulus during systolic ejection.  There are essentially no regurgitant  flow and  no obstruction to inflow during the diastolic inflow.   Left atrium:  The left atrial chamber is essentially normal in size and  function.  No masses are noted within.  The interatrial septum is  interrogated and is intact.   Right ventricle:  Essentially normal right ventricular chamber.   Tricuspid valve:  There is trace regurgitant flow noted; otherwise, a  normal tricuspid valve.   Right atrium:  Essentially normal.  Pulmonary artery catheter noted  within.   Aortic valve:  A thin, three-cusp aortic valve seen in both short-axis  and long-axis views.  There is no obstruction to systolic ejection, nor  is there any diastolic regurgitant flow noted.  Essentially normal  aortic valve.   The patient was placed on cardiopulmonary bypass and coronary artery  bypass grafting was carried out.  And then the patient was separated  from cardiopulmonary bypass with the initial attempt.   POST-CARDIOPULMONARY BYPASS TEE EXAMINATION (LIMITED EXAM):  There is  good excellent overall contractility noted in the left ventricular  chamber in the post-bypass views.  Multiple views are obtained, both  short and long axis, revealing good-to-excellent overall contractility,  with just a hint of the mild hypocontractile aspect in  that posterior  inferior wall.  Overall, the rest of the cardiac examination is as  described in the prebypass period, without any significant changes.   The patient was returned to the cardiac intensive care unit in stable  condition.           ______________________________  Burna Forts, M.D.     JTM/MEDQ  D:  11/10/2006  T:  11/10/2006  Job:  [id number]

## 2011-04-02 NOTE — Op Note (Signed)
[name], [name] NO.:  [id number]   MEDICAL RECORD NO.:  [id number]          PATIENT TYPE:  INP   LOCATION:  2307                         FACILITY:  [facility]   PHYSICIAN:  Salvatore Decent. Cornelius Moras, M.D. DATE OF BIRTH:  March 11, 1951   DATE OF PROCEDURE:  11/10/2006  DATE OF DISCHARGE:                               OPERATIVE REPORT   PREOPERATIVE DIAGNOSIS:  Severe three-vessel coronary artery disease.   POSTOPERATIVE DIAGNOSIS:  Severe three-vessel coronary artery disease.   PROCEDURE:  Median sternotomy for coronary artery bypass grafting x3  (left internal mammary artery to distal left anterior descending  coronary artery, saphenous vein graft to circumflex marginal branch,  saphenous vein graft to posterior descending coronary artery, endoscopic  saphenous vein harvest from right thigh and upper portion of the right  lower leg).   SURGEON:  Salvatore Decent. Cornelius Moras, M.D.   ASSISTANT:  Constance Holster, PA   ANESTHESIA:  General.   BRIEF CLINICAL NOTE:  The patient is a 60 year old female from [place],  [place], with no previous history of coronary artery disease but  risk factors notable for longstanding history of type 2 diabetes  mellitus, hyperlipidemia, and a strong family history of coronary artery  disease.  The patient presents with exertional angina.  She had a stress  test which was abnormal prompting cardiac catheterization, which was  performed December 13.  This revealed severe three-vessel coronary  artery disease with moderate left ventricular dysfunction.  A full  consultation note has been dictated previously.  The patient has been  counseled at length regarding the indications, risks, and potential  benefits of surgery.  Alternative treatment strategies have been  discussed.  They understand and accept all associated risks of surgery  and desire to proceed as described.   OPERATIVE FINDINGS:  1. Mild left ventricular dysfunction with ejection  fraction estimated      at 45-50%.  2. Good-quality left internal mammary artery and saphenous vein      conduit for grafting.  3. Diffuse coronary artery disease with relatively poor target vessels      for grafting.   OPERATIVE NOTE IN DETAIL:  The patient is brought to the operating room  on the above-mentioned date and central monitoring is established by the  anesthesia service under the care and direction of Dr. Sharee Holster.  Specifically, a Swan-Ganz catheter is placed through the right internal  jugular approach.  A radial arterial line is placed.  Intravenous  antibiotics are administered.  Following induction with general  endotracheal anesthesia, a Foley catheter is placed.  The patient's  chest, abdomen, both groins, and both lower extremities are prepared and  draped in a sterile manner.  Baseline transesophageal echocardiogram is  performed by Dr. Jacklynn Bue.  This demonstrates mild left ventricular  dysfunction with overall no abnormal other findings.  There is mild  inferior wall hypokinesis.   A median sternotomy incision is performed and left internal mammary  artery is dissected from the chest wall and prepared for bypass  grafting.  The left internal mammary artery is good-quality conduit.  Simultaneously saphenous vein  is obtained from the patient's right thigh  using endoscopic vein harvest technique.  Initially a small incision is  made in the left lower thigh and the saphenous vein is felt to be  slightly small-caliber on that side.  The vein is left in situ and the  saphenous vein is removed from the right thigh.  The vein which is  removed and utilized for surgery is felt to be satisfactory quality-  conduit for grafting.  After this saphenous vein is removed, the small  surgical incisions in both lower extremities are closed in multiple  layers with running absorbable suture.  The patient is heparinized  systemically.  The left internal mammary artery is  transected distally  and is noted to have excellent flow.   The pericardium is opened.  The ascending aorta is normal.  The  ascending aorta and the right atrium were cannulated for cardiopulmonary  bypass.  Cardiopulmonary bypass is begun and the surface of the heart is  inspected.  There is diffuse coronary artery disease with hard, visible  plaque throughout all the epicardial coronary vessels.  The diagonal  branches off the left anterior descending coronary artery are too small  for grafting.  The posterolateral branches off the distal left  circumflex coronary artery are very small and too small for grafting.  A  temperature probe is placed in the left ventricular septum.  A  cardioplegia catheter is placed in the ascending aorta.   The patient is allowed to cool passively to 32 degrees' systemic  temperature.  The aortic crossclamp is applied and cold blood  cardioplegia is administered in antegrade fashion through the aortic  root.  Iced saline slush is applied for topical hypothermia.  The  initial cardioplegic arrest and myocardial cooling is felt to be  excellent.  Repeat doses of cardioplegia are administered intermittently  throughout the crossclamp portion of the operation through the aortic  root and down subsequently-placed vein grafts to maintain left  ventricular septal temperature below 15 degrees centigrade.   The following distal coronary anastomoses are performed:   1. The posterior descending coronary artery, which is a terminal      branch of the distal right coronary artery, is grafted with a      saphenous vein graft in an end-to-side fashion.  This vessel      measures 1.2 mm in diameter at the site of distal bypass.  It is a      poor-quality vessel.  A 1.0 probe will pass a short distance      retrograde and it hits obstruction.  Antegrade it will pass a short     distance as well.  The distal right coronary artery appears      chronically occluded.  2.  The circumflex marginal branch is grafted with a saphenous vein      graft in an end-to-side fashion.  This vessel measured 1.7 mm in      diameter.  It is diffusely diseased proximally and distally but an      adequate target vessel at the site of distal grafting.  3. The distal left anterior descending coronary artery is grafted with      a left internal mammary artery in an end-to-side fashion.  This      vessel measures 1.3 mm in diameter and is a fair-quality target      vessel.  It is diffusely diseased proximally and distally.  A 1.0      probe  will pass both directions easily.   Both proximal saphenous vein anastomoses are performed directly to the  ascending aorta prior to removal of the aortic crossclamp.  The left  ventricular septal temperature rises rapidly with reperfusion of the  left internal mammary artery.  The heart begins to beat spontaneously.  The aortic crossclamp is removed after a total crossclamp time of 54  minutes.  All proximal and distal coronary anastomoses are inspected for  hemostasis and appropriate graft orientation.  Epicardial pacing wires  are affixed to the right ventricular outflow tract and to the right  atrial appendage.  The patient is rewarmed to 37 degrees centigrade  temperature.  The patient is weaned from cardiopulmonary bypass without  difficulty.  The patient's rhythm at separation from bypass is normal  sinus rhythm.  No inotropic support is required.  Total cardiopulmonary  bypass time for the operation is 66 minutes.  Follow-up transesophageal  echocardiogram performed by Dr. Jacklynn Bue after separation from bypass  demonstrates normal left ventricular function.   The venous and arterial cannulae are removed uneventfully.  Protamine is  administered to reverse the anticoagulation.  The mediastinum and left  chest are irrigated with saline solution containing vancomycin.  Meticulous surgical hemostasis is ascertained.  The mediastinum and  both  left and right pleural spaces are all drained using four chest tubes  exited through separate stab incisions inferiorly.  The soft tissues  anterior to the aorta are reapproximated loosely.  The On-Q continuous  pain management system is utilized to facilitate postoperative pain  control.  Two 10-inch catheters supplied with the On-Q kit are tunneled  into the deep subcutaneous tissues and positioned just lateral to the  lateral border of the sternum on either side.  Each catheter is flushed  with 5 mL of 0.5% bupivacaine solution and ultimately connected to a  continuous infusion pump.  The sternum was closed with sternal wire.  The soft tissues anterior to the sternum are closed in multiple layers  and the skin is closed with a running subcuticular skin closure.   The patient tolerated the procedure well and is transported to the surgical intensive care unit in stable condition.  There are no  intraoperative complications.  All sponge, instrument and needle counts  are verified correct at completion of the operation.  The patient did  receive 2 units packed red blood cells during cardiopulmonary bypass due  to anemia which was present preoperatively and exacerbated by surgery.      Salvatore Decent. Cornelius Moras, M.D.  Electronically Signed     CHO/MEDQ  D:  11/10/2006  T:  11/10/2006  Job:  [id number]   cc:   Learta Codding, MD,FACC  Veverly Fells. Excell Seltzer, MD  [facility] Office

## 2011-04-02 NOTE — Discharge Summary (Signed)
NAMEARDINE, IACOVELLI NO.:  [id number]   MEDICAL RECORD NO.:  [id number]          PATIENT TYPE:  INP   LOCATION:  2039                         FACILITY:  [facility]   PHYSICIAN:  Salvatore Decent. Cornelius Moras, M.D. DATE OF BIRTH:  1951-03-27   DATE OF ADMISSION:  11/10/2006  DATE OF DISCHARGE:                               DISCHARGE SUMMARY   DISCHARGE DATE:  Tentatively 1-2 days.   ADMISSION DIAGNOSIS:  Exertional chest discomfort and shortness of  breath.   DISCHARGE DIAGNOSES:  1. Severe 3-vessel coronary artery disease status post coronary artery      bypass graft.  2. Hypertension.  3. Hyperlipidemia.  4. Insulin-dependent diabetes mellitus.  5. Status post cholecystectomy, September 2007.  6. Chronic leg pains.   CONSULTS:  None.   PROCEDURES:  1. November 10, 2006, patient underwent intraoperative transesophageal      echocardiogram by Dr. Burna Forts.  2. November 10, 2006, patient underwent median sternotomy for coronary      artery bypass grafting x3 (left internal mammary artery to the      distal left anterior descending coronary artery, saphenous vein      graft to circumflex marginal branch, saphenous vein graft to the      posterior descending coronary) and endoscopic vein harvest in the      right thigh and upper portion of the right lower leg by Dr.      Tressie Stalker.   HISTORY AND PHYSICAL:  This is a 60 year old female from [place], [place], with no previous history of coronary artery disease, but risk  factors were longstanding history of type 2 diabetes mellitus,  hyperlipidemia, and strong family history of coronary artery disease.  The patient presents with exertional angina.  She had a stress test  which was abnormal and she had a followup cardiac cath on October 27, 2006.  Cardiac cath reveals 3-vessel coronary artery disease with  moderate left ventricular dysfunction.  It was best thought that the  patient undergo coronary  artery bypass grafting and she agreed.  Please  see dictated history and physical for further history and physical  details.   HOSPITAL COURSE:  The patient's hospital course has been uneventful.  She underwent a coronary artery bypass grafting x3 on November 10, 2006,  by Dr. Tressie Stalker without any complications.  Postoperatively, the  patient has been progressing as expected.  She was extubated  postoperative day #1.  The patient has maintained normal sinus rhythm.  Her blood pressure has remained stable about 120s/60s.  She has been  maintained on a low dose of Lopressor.  Altace was started on  postoperative day #3.  The patient did have some postoperative acute  blood loss anemia; however, her hemoglobin and hematocrit did rise  appropriately and she is stable at 11.9.  The patient did not require  any blood transfusions.  She had some postop volume overload and she is  being diuresed with Lasix and responding appropriately.   The patient has good urine output.  She was maintained on a sliding  scale of insulin and  Lantus postoperative day #1.  The patient's  metformin was restarted on postoperative day #3.  Her blood sugars have  been slightly high in the 200s and upper 150s.  The patient was  transferred to 2000 on postoperative day #2 without any complications.  She is ambulating with cardiac rehab with a very steady gait.   The patient has maintained afebrile.  Her chest x-rays have been stable.  Chest x-ray on postoperative day #3 has improved aeration and no  pneumothorax.   PHYSICAL EXAM:  VITAL SIGNS:  Blood pressure 115/79, heart rate 105,  respirations 22, temp 98.6, and O2 saturation is 95% on room air.  Weight is 56.5, preoperative weight 58.  CARDIAC:  Regular rate and rhythm.  RESPIRATIONS:  Decreased breath sounds at bases.  ABDOMEN:  Soft, nontender, nondistended.  Bowel sounds positive.  EXTREMITIES:  No edema.  Incisions clear, dry and intact.   The  patient's labs show white blood cell count of 6.4, hemoglobin 11.9,  hematocrit 35.1, and platelet count of 202.  BMP shows a sodium of 140,  potassium 4, chloride 104, CO2 30, BUN 19, creatinine 0.6, and a glucose  of 153.  The patient's INR is 1.   The patient did not have a bowel movement.  She states that she only has  about 2-3 bowel movements a month.  Laxatives upset her stomach.   DISCHARGE DISPOSITION:  The patient will be discharged home in the next  1-2 days provided she continues to progress in a positive manner.   INSTRUCTIONS:  The patient is instructed to follow a low-fat/low-salt  diet and diabetic diet.  No driving or heavy lifting greater than 10  pounds for 3 weeks.  The patient is to ambulate 3 to 4 times daily,  increase activity as tolerated.  She is to continue her breathing  exercises.  She may shower and clean her incisions with mild soap and  water.  Call the office if any wound problems arise.  The patient is to  call Dr. Andee Lineman for an appointment in 2 weeks, where she will have an  chest x-ray taken.  The patient is to follow up with Dr. Cornelius Moras in 3  weeks.  The office will contact her with time and date of appointment.   MEDICATIONS:  1. Aspirin 81 mg p.o. daily.  2. Toprol XL 25 mg p.o. daily.  3. Altace 2.5 mg p.o. daily.  4. Lipitor 10 mg p.o. daily.  5. Oxycodone 5 mg 1-2 tabs every 4 hours p.r.n.  6. Metformin 500 mg p.o. b.i.d.  7. Lantus 26-30 units subcutaneous q.h.s.  8. Lasix 20 mg p.o. daily x5 days.  9. Potassium chloride 10 mEq p.o. daily x5 days.  10.Lyrica 50 mg p.o. daily.      Constance Holster, PA      Salvatore Decent. Cornelius Moras, M.D.  Electronically Signed    JMW/MEDQ  D:  11/13/2006  T:  11/14/2006  Job:  [id number]   cc:   Salvatore Decent. Cornelius Moras, M.D.  Learta Codding, MD,FACC

## 2011-09-01 LAB — I-STAT 8, (EC8 V) (CONVERTED LAB)
BUN: 21
Glucose, Bld: 99
HCT: 39
Hemoglobin: 13.3
Operator id: 288331
Potassium: 4.6
Sodium: 141
TCO2: 22

## 2011-09-01 LAB — TROPONIN I: Troponin I: 0.01

## 2011-09-01 LAB — CK TOTAL AND CKMB (NOT AT ARMC)
CK, MB: 1.7
Relative Index: INVALID
Total CK: 66

## 2011-09-01 LAB — POCT CARDIAC MARKERS: CKMB, poc: <1 — ABNORMAL LOW

## 2011-09-01 LAB — D-DIMER, QUANTITATIVE: D-Dimer, Quant: 0.38

## 2011-09-23 ENCOUNTER — Encounter: Payer: Self-pay | Admitting: Emergency Medicine

## 2011-09-23 ENCOUNTER — Emergency Department (HOSPITAL_COMMUNITY)
Admission: EM | Admit: 2011-09-23 | Discharge: 2011-09-23 | Disposition: A | Discharge status: Home / Self Care | Payer: Medicaid Other | Attending: Emergency Medicine | Admitting: Emergency Medicine

## 2011-09-23 DIAGNOSIS — Z794 Long term (current) use of insulin: Secondary | ICD-10-CM | POA: Insufficient documentation

## 2011-09-23 DIAGNOSIS — R296 Repeated falls: Secondary | ICD-10-CM | POA: Insufficient documentation

## 2011-09-23 DIAGNOSIS — M25539 Pain in unspecified wrist: Secondary | ICD-10-CM | POA: Insufficient documentation

## 2011-09-23 DIAGNOSIS — I10 Essential (primary) hypertension: Secondary | ICD-10-CM | POA: Insufficient documentation

## 2011-09-23 DIAGNOSIS — S52539A Colles' fracture of unspecified radius, initial encounter for closed fracture: Secondary | ICD-10-CM | POA: Insufficient documentation

## 2011-09-23 DIAGNOSIS — E119 Type 2 diabetes mellitus without complications: Secondary | ICD-10-CM | POA: Insufficient documentation

## 2011-09-23 DIAGNOSIS — S62101A Fracture of unspecified carpal bone, right wrist, initial encounter for closed fracture: Secondary | ICD-10-CM

## 2011-09-23 HISTORY — DX: Malignant (primary) neoplasm, unspecified: C80.1

## 2011-09-23 HISTORY — DX: Essential (primary) hypertension: I10

## 2011-09-23 HISTORY — DX: Neuralgia and neuritis, unspecified: M79.2

## 2011-09-23 NOTE — ED Notes (Signed)
Fell yesterday and broke her arm saw the dr in  [place].  Needs surgey she states

## 2011-09-23 NOTE — ED Notes (Signed)
Pt states she is drowsy due to pain pills

## 2011-09-23 NOTE — ED Notes (Signed)
Pa has been in to see pt and  Speak with her daughter...there was concern about patient getting follow up

## 2011-09-23 NOTE — ED Provider Notes (Signed)
Medical screening examination/treatment/procedure(s) were performed by non-physician practitioner and as supervising physician I was immediately available for consultation/collaboration.  Raeford Razor, MD 09/23/11 [phone]

## 2011-09-23 NOTE — ED Provider Notes (Signed)
History    Pt tripped on her leg last night and fell.  She used her right hand to block the fall, and suffered a FOOSH injury.  She was seen in the ED in [place].  A right wrist x-ray was performed showing a comminuted mildly displaced wrist fracture. Wrist splint was applied, and patient was recommended to be seen by a surgeon the next day. Per family member, they're unable to locate a surgeon that can see her today.  Pt denies increase pain, or loss of sensation.  Denies elbow pain.  Denies fever, or headache.    CSN: [id number] Arrival date & time: 09/23/2011  1:15 PM   First MD Initiated Contact with Patient 09/23/11 1321      Chief Complaint  Patient presents with  . Arm Pain    (Consider location/radiation/quality/duration/timing/severity/associated sxs/prior treatment) Patient is a 60 y.o. female presenting with arm injury and wrist pain. The history is provided by the patient. No language interpreter was used.  Arm Injury  Pertinent negatives include no numbness.  Wrist Pain This is a new problem. The current episode started yesterday. The problem occurs constantly. The problem has been unchanged. Pertinent negatives include no arthralgias or numbness. The symptoms are aggravated by bending and twisting.    Past Medical History  Diagnosis Date  . Diabetes mellitus   . Hypertension   . Cancer   . Neuropathic pain, leg     History reviewed. No pertinent past surgical history.  No family history on file.  History  Substance Use Topics  . Smoking status: Never Smoker   . Smokeless tobacco: Not on file  . Alcohol Use: No    OB History    Grav Para Term Preterm Abortions TAB SAB Ect Mult Living                  Review of Systems  Musculoskeletal: Negative for arthralgias.  Neurological: Negative for numbness.  All other systems reviewed and are negative.    Allergies  Review of patient's allergies indicates no known allergies.  Home Medications   Current  Outpatient Rx  Name Route Sig Dispense Refill  . CITALOPRAM HYDROBROMIDE 20 MG PO TABS Oral Take 10 mg by mouth daily.      Marland Kitchen FOLIC ACID 1 MG PO TABS Oral Take 1 mg by mouth daily.      Marland Kitchen GABAPENTIN 300 MG PO CAPS Oral Take 300 mg by mouth 3 (three) times daily.      . INSULIN GLARGINE 100 UNIT/ML [place] SOLN Subcutaneous Inject 20 Units into the skin at bedtime.      Marland Kitchen LETROZOLE 2.5 MG PO TABS Oral Take 2.5 mg by mouth daily.      Marland Kitchen METFORMIN HCL 500 MG PO TABS Oral Take 500 mg by mouth 2 (two) times daily with a meal.        BP 113/60  Pulse 68  Temp(Src) 97.9 F (36.6 C) (Oral)  Resp 20  SpO2 94%  Physical Exam  Nursing note and vitals reviewed. Constitutional:       Awake, alert, nontoxic appearance  HENT:  Head: Atraumatic.  Eyes: Right eye exhibits no discharge. Left eye exhibits no discharge.  Neck: Neck supple.  Pulmonary/Chest: Effort normal. She exhibits no tenderness.  Abdominal: There is no tenderness. There is no rebound.  Musculoskeletal: She exhibits no tenderness.       R arm: arm is in a splint with sling in place.  Brisk cap refills to  distal fingers, with normal sensation.  No elbow pain.    Neurological:       Mental status and motor strength appears baseline for patient and situation  Skin: No rash noted.  Psychiatric: She has a normal mood and affect.    ED Course  Procedures (including critical care time)  Labs Reviewed - No data to display No results found.   No diagnosis found.    MDM  Consult with hand surgeon Dr. Thomasena Edis who has agrees to follow up with patient sometime next week. Patient and family member agrees with plan.     Xray:  Right Wrist -Completel 3+ VIEW from [facility] on 09/22/11 @ 18:13  Comminuted, mildly impacted fracture of the distal radial metaphysis. Mild dorsal displacement and dorsal angulation of the distal fragments. Diffuse arterial calcifications. Diffuse osteopenia.  Fayrene Helper,  PA Resident 09/23/11 [phone]

## 2011-09-23 NOTE — ED Notes (Signed)
Brisk cap refill to fingers of both hands..the patient has splint to elbow and sling

## 2011-09-27 ENCOUNTER — Other Ambulatory Visit: Payer: Self-pay | Admitting: Orthopedic Surgery

## 2011-09-30 ENCOUNTER — Encounter (HOSPITAL_BASED_OUTPATIENT_CLINIC_OR_DEPARTMENT_OTHER)
Admission: RE | Admit: 2011-09-30 | Discharge: 2011-09-30 | Disposition: A | Discharge status: Home / Self Care | Payer: Medicaid Other | Source: Ambulatory Visit | Attending: Orthopedic Surgery | Admitting: Orthopedic Surgery

## 2011-09-30 ENCOUNTER — Other Ambulatory Visit: Payer: Self-pay | Admitting: Orthopedic Surgery

## 2011-09-30 ENCOUNTER — Encounter (HOSPITAL_BASED_OUTPATIENT_CLINIC_OR_DEPARTMENT_OTHER): Payer: Self-pay | Admitting: *Deleted

## 2011-09-30 ENCOUNTER — Ambulatory Visit
Admission: RE | Admit: 2011-09-30 | Discharge: 2011-09-30 | Disposition: A | Discharge status: Home / Self Care | Payer: Medicaid Other | Source: Ambulatory Visit | Attending: Orthopedic Surgery | Admitting: Orthopedic Surgery

## 2011-09-30 ENCOUNTER — Other Ambulatory Visit: Payer: Self-pay

## 2011-09-30 DIAGNOSIS — Z01811 Encounter for preprocedural respiratory examination: Secondary | ICD-10-CM

## 2011-09-30 NOTE — Progress Notes (Signed)
Has not seen cardiology since 2011-sending notes

## 2011-09-30 NOTE — H&P (Signed)
  SUBJECTIVE: [name] is a pleasant 60 year old female who fell 3 days ago on her outstretched right wrist.  She ultimately went to [facility] where she was x-rayed and splinted, and she was ultimately sent down to [facility] where x-rays were taken of the left wrist which confirmed a comminuted, dorsally angulated, extra-articular right distal radius fracture.  She is in a sugar-tong splint and is here for evaluation of this.  She was given an Rx for Vicodin 5 mg, which is not really controlling her pain as much as she would like.  PAST MEDICAL HISTORY: Her PCP is Dr. Gareth Eagle.  MEDICAL PROBLEMS:  Positive for insulin-dependent diabetes, neuropathy, coronary artery disease, hyperlipidemia, and history of left-sided breast cancer in 2010.  PAST SURGICAL HISTORY:  Mastectomy on the left in 2010, CABG in 2007, external-fixation device in the left wrist for a distal radius fracture in 2011.  CURRENT MEDICATIONS:  Aspirin 81 mg daily, Lantus 20 units at bedtime, Neurontin 300 mg b.i.d., NovoLog sliding scale, Plavix 75 mg daily, Fosamax 70 mg weekly, letrozole 25 mg daily, folic acid 1 mg daily, metformin 1000 mg b.i.d., Crestor 20 mg daily, B complex vitamin daily.  SOCIAL HISTORY:  She does not smoke or drink alcohol.  She is married. She lives with 5 other people.  Does not work outside the home.  FAMILY HISTORY:  Positive for diabetes and heart disease.  REVIEW OF SYSTEMS: Review of systems reviewed thoroughly and positive for breast cancer on the left in 2010, triple bypass CABG in 2007, balance problems, glasses, and easy bruising; all other systems are negative as related to the chief complaint.  PHYSICAL EXAMINATION: This is a well-developed, well-nourished, thin-appearing female in no acute distress.  She is in a sling.  Her right arm is in a splint.  She is 5 feet 5 inches tall and weighs 136 pounds.  Head:  Normocephalic and atraumatic:  Eyes:  Pupils are equal and reactive to  light.  Heart:  Regular rate and rhythm.  Lungs:  Clear. Abdomen:  Soft.  Right Upper Extremity:  She is in a sugar-tong splint.  She moves her fingers actively. Good capillary refill.  RADIOGRAPHS: Other reviewed images.  X-rays are reviewed via computer and show a right distal radius fracture with some comminution and dorsal angulation.  DIAGNOSIS: Right distal radius fracture.  PLAN: Tia is a candidate for ORIF of her right wrist with DVR plate and screws.  We will get this set up next week.  I explained the risks and benefits of the procedure with the patient, and all questions were answered.  I gave her an Rx for Percocet 5 mg #50, 1 to 2 q. 6 hours p.r.n. pain with no refills.  She will use this at this point as well as postoperatively.  We also took her out of her sugar-tong splint and put her in a wrist/forearm splint.

## 2011-09-30 NOTE — Progress Notes (Signed)
Pt fell earlier-sore lt rib-to do cxr fx rt wrist Poor historian-called dr degent for notes

## 2011-10-01 ENCOUNTER — Ambulatory Visit (HOSPITAL_COMMUNITY): Payer: Medicaid Other | Admitting: Anesthesiology

## 2011-10-01 ENCOUNTER — Other Ambulatory Visit: Payer: Self-pay

## 2011-10-01 ENCOUNTER — Encounter (HOSPITAL_BASED_OUTPATIENT_CLINIC_OR_DEPARTMENT_OTHER): Admission: RE | Payer: Self-pay | Source: Ambulatory Visit

## 2011-10-01 ENCOUNTER — Encounter (HOSPITAL_COMMUNITY): Payer: Self-pay | Admitting: Anesthesiology

## 2011-10-01 ENCOUNTER — Encounter (HOSPITAL_COMMUNITY): Payer: Self-pay | Admitting: *Deleted

## 2011-10-01 ENCOUNTER — Ambulatory Visit (HOSPITAL_BASED_OUTPATIENT_CLINIC_OR_DEPARTMENT_OTHER): Admission: RE | Admit: 2011-10-01 | Payer: Medicaid Other | Source: Ambulatory Visit | Admitting: Orthopedic Surgery

## 2011-10-01 ENCOUNTER — Ambulatory Visit (HOSPITAL_COMMUNITY)
Admission: RE | Admit: 2011-10-01 | Discharge: 2011-10-01 | Disposition: A | Discharge status: Home / Self Care | Payer: Medicaid Other | Source: Ambulatory Visit | Attending: Orthopedic Surgery | Admitting: Orthopedic Surgery

## 2011-10-01 ENCOUNTER — Encounter (HOSPITAL_COMMUNITY)
Admission: RE | Disposition: A | Discharge status: Home / Self Care | Payer: Self-pay | Source: Ambulatory Visit | Attending: Orthopedic Surgery

## 2011-10-01 DIAGNOSIS — I2510 Atherosclerotic heart disease of native coronary artery without angina pectoris: Secondary | ICD-10-CM | POA: Insufficient documentation

## 2011-10-01 DIAGNOSIS — W010XXA Fall on same level from slipping, tripping and stumbling without subsequent striking against object, initial encounter: Secondary | ICD-10-CM | POA: Insufficient documentation

## 2011-10-01 DIAGNOSIS — S52501A Unspecified fracture of the lower end of right radius, initial encounter for closed fracture: Secondary | ICD-10-CM | POA: Diagnosis present

## 2011-10-01 DIAGNOSIS — S52599A Other fractures of lower end of unspecified radius, initial encounter for closed fracture: Secondary | ICD-10-CM | POA: Insufficient documentation

## 2011-10-01 DIAGNOSIS — I10 Essential (primary) hypertension: Secondary | ICD-10-CM | POA: Insufficient documentation

## 2011-10-01 DIAGNOSIS — Y929 Unspecified place or not applicable: Secondary | ICD-10-CM | POA: Insufficient documentation

## 2011-10-01 DIAGNOSIS — E119 Type 2 diabetes mellitus without complications: Secondary | ICD-10-CM | POA: Insufficient documentation

## 2011-10-01 DIAGNOSIS — S52601A Unspecified fracture of lower end of right ulna, initial encounter for closed fracture: Secondary | ICD-10-CM

## 2011-10-01 HISTORY — DX: Atherosclerotic heart disease of native coronary artery without angina pectoris: I25.10

## 2011-10-01 HISTORY — DX: Anxiety disorder, unspecified: F41.9

## 2011-10-01 HISTORY — DX: Unspecified osteoarthritis, unspecified site: M19.90

## 2011-10-01 HISTORY — PX: ORIF ULNAR FRACTURE: SHX5417

## 2011-10-01 HISTORY — DX: Myoneural disorder, unspecified: G70.9

## 2011-10-01 LAB — CBC
HCT: 38.5 % (ref 36.0–46.0)
Hemoglobin: 12.6 g/dL (ref 12.0–15.0)
RBC: 4.33 MIL/uL (ref 3.87–5.11)
WBC: 4.5 10*3/uL (ref 4.0–10.5)

## 2011-10-01 LAB — BASIC METABOLIC PANEL
BUN: 18 mg/dL (ref 6–23)
CO2: 25 mEq/L (ref 19–32)
Chloride: 106 mEq/L (ref 96–112)
GFR calc non Af Amer: >90 mL/min (ref >90)
Glucose, Bld: 133 mg/dL — ABNORMAL HIGH (ref 70–99)
Potassium: 4.4 mEq/L (ref 3.5–5.1)
Sodium: 139 mEq/L (ref 135–145)

## 2011-10-01 LAB — SURGICAL PCR SCREEN: Staphylococcus aureus: POSITIVE — AB

## 2011-10-01 LAB — GLUCOSE, CAPILLARY
Glucose-Capillary: 144 mg/dL — ABNORMAL HIGH (ref 70–99)
Glucose-Capillary: 143 mg/dL — ABNORMAL HIGH (ref 70–99)

## 2011-10-01 SURGERY — OPEN REDUCTION INTERNAL FIXATION (ORIF) ULNAR FRACTURE
Anesthesia: General | Site: Wrist | Laterality: Right | Wound class: Clean

## 2011-10-01 SURGERY — OPEN REDUCTION INTERNAL FIXATION (ORIF) SCAPHOID WITH DISTAL RADIUS GRAFT
Anesthesia: General | Laterality: Left

## 2011-10-01 MED ORDER — PROPOFOL 10 MG/ML IV EMUL
INTRAVENOUS | Status: DC | PRN
  Administered 2011-10-01: 150 mg via INTRAVENOUS

## 2011-10-01 MED ORDER — MEPERIDINE HCL 25 MG/ML IJ SOLN: 6.2500 mg | INTRAMUSCULAR | Status: DC | PRN

## 2011-10-01 MED ORDER — LACTATED RINGERS IV SOLN
INTRAVENOUS | Status: DC | PRN
  Administered 2011-10-01: 13:00:00 via INTRAVENOUS

## 2011-10-01 MED ORDER — MIDAZOLAM HCL 2 MG/2ML IJ SOLN
1.0000 mg | INTRAMUSCULAR | Status: DC | PRN
  Administered 2011-10-01: 2 mg via INTRAVENOUS

## 2011-10-01 MED ORDER — CEFAZOLIN SODIUM 1-5 GM-% IV SOLN
INTRAVENOUS | Status: AC
  Filled 2011-10-01: qty 50

## 2011-10-01 MED ORDER — ONDANSETRON HCL 4 MG/2ML IJ SOLN
INTRAMUSCULAR | Status: DC | PRN
  Administered 2011-10-01: 4 mg via INTRAVENOUS

## 2011-10-01 MED ORDER — FENTANYL CITRATE 0.05 MG/ML IJ SOLN
INTRAMUSCULAR | Status: DC | PRN
  Administered 2011-10-01: 100 ug via INTRAVENOUS
  Administered 2011-10-01 (×2): 50 ug via INTRAVENOUS
  Administered 2011-10-01: 100 ug via INTRAVENOUS
  Administered 2011-10-01 (×3): 50 ug via INTRAVENOUS

## 2011-10-01 MED ORDER — FENTANYL CITRATE 0.05 MG/ML IJ SOLN
50.0000 ug | INTRAMUSCULAR | Status: DC | PRN
  Administered 2011-10-01: 100 ug via INTRAVENOUS

## 2011-10-01 MED ORDER — FENTANYL CITRATE 0.05 MG/ML IJ SOLN
INTRAMUSCULAR | Status: AC
  Filled 2011-10-01: qty 2

## 2011-10-01 MED ORDER — CEFAZOLIN SODIUM 1-5 GM-% IV SOLN
1.0000 g | Freq: Once | INTRAVENOUS | Status: AC
  Administered 2011-10-01: 1 g via INTRAVENOUS
  Filled 2011-10-01: qty 50

## 2011-10-01 MED ORDER — ONDANSETRON HCL 4 MG/2ML IJ SOLN: 4.0000 mg | Freq: Once | INTRAMUSCULAR | Status: DC | PRN

## 2011-10-01 MED ORDER — CHLORHEXIDINE GLUCONATE 4 % EX LIQD: 60.0000 mL | Freq: Once | CUTANEOUS | Status: DC

## 2011-10-01 MED ORDER — OXYCODONE-ACETAMINOPHEN 5-325 MG PO TABS
1.0000 | ORAL_TABLET | Freq: Once | ORAL | Status: AC
  Administered 2011-10-01: 1 via ORAL

## 2011-10-01 MED ORDER — OXYCODONE-ACETAMINOPHEN 5-325 MG PO TABS: 1.0000 | ORAL_TABLET | ORAL | Status: AC | PRN

## 2011-10-01 MED ORDER — LACTATED RINGERS IV SOLN
INTRAVENOUS | Status: DC
  Administered 2011-10-01: 13:00:00 via INTRAVENOUS

## 2011-10-01 MED ORDER — HYDROMORPHONE HCL PF 1 MG/ML IJ SOLN
0.2500 mg | INTRAMUSCULAR | Status: DC | PRN
  Administered 2011-10-01 (×2): 0.25 mg via INTRAVENOUS
  Administered 2011-10-01: 0.5 mg via INTRAVENOUS

## 2011-10-01 MED ORDER — KETOROLAC TROMETHAMINE 30 MG/ML IJ SOLN
30.0000 mg | Freq: Once | INTRAMUSCULAR | Status: AC
  Administered 2011-10-01: 30 mg via INTRAVENOUS

## 2011-10-01 MED ORDER — MUPIROCIN 2 % EX OINT
TOPICAL_OINTMENT | CUTANEOUS | Status: AC
  Administered 2011-10-01: 11:00:00
  Filled 2011-10-01: qty 22

## 2011-10-01 MED ORDER — MIDAZOLAM HCL 2 MG/2ML IJ SOLN
INTRAMUSCULAR | Status: AC
  Filled 2011-10-01: qty 2

## 2011-10-01 MED ORDER — BUPIVACAINE-EPINEPHRINE PF 0.5-1:200000 % IJ SOLN
INTRAMUSCULAR | Status: DC | PRN
  Administered 2011-10-01: 25 mL

## 2011-10-01 MED ORDER — MORPHINE SULFATE 2 MG/ML IJ SOLN: 0.0500 mg/kg | INTRAMUSCULAR | Status: DC | PRN

## 2011-10-01 SURGICAL SUPPLY — 51 items
APL SKNCLS STERI-STRIP NONHPOA (GAUZE/BANDAGES/DRESSINGS)
BANDAGE ELASTIC 3 VELCRO ST LF (GAUZE/BANDAGES/DRESSINGS) ×1 IMPLANT
BANDAGE ELASTIC 4 VELCRO ST LF (GAUZE/BANDAGES/DRESSINGS) ×1 IMPLANT
BANDAGE GAUZE ELAST BULKY 4 IN (GAUZE/BANDAGES/DRESSINGS) ×2 IMPLANT
BENZOIN TINCTURE PRP APPL 2/3 (GAUZE/BANDAGES/DRESSINGS) ×1 IMPLANT
BIT DRILL 2 FAST STEP (BIT) ×2 IMPLANT
BIT DRILL 2.5X4 QC (BIT) ×1 IMPLANT
BNDG CMPR 9X4 STRL LF SNTH (GAUZE/BANDAGES/DRESSINGS) ×1
BNDG CMPR MD 5X2 ELC HKLP STRL (GAUZE/BANDAGES/DRESSINGS) ×1
BNDG ELASTIC 2 VLCR STRL LF (GAUZE/BANDAGES/DRESSINGS) ×2 IMPLANT
BNDG ESMARK 4X9 LF (GAUZE/BANDAGES/DRESSINGS) ×2 IMPLANT
CLOTH BEACON ORANGE TIMEOUT ST (SAFETY) ×2 IMPLANT
CUFF TOURNIQUET SINGLE 18IN (TOURNIQUET CUFF) ×2 IMPLANT
CUFF TOURNIQUET SINGLE 24IN (TOURNIQUET CUFF) IMPLANT
DRAPE OEC MINIVIEW 54X84 (DRAPES) ×2 IMPLANT
DURAPREP 26ML APPLICATOR (WOUND CARE) ×2 IMPLANT
ELECT NEEDLE TIP 2.8 STRL (NEEDLE) IMPLANT
ELECT REM PT RETURN 9FT ADLT (ELECTROSURGICAL) ×2
ELECTRODE REM PT RTRN 9FT ADLT (ELECTROSURGICAL) ×1 IMPLANT
GAUZE XEROFORM 1X8 LF (GAUZE/BANDAGES/DRESSINGS) ×1 IMPLANT
GAUZE XEROFORM 5X9 LF (GAUZE/BANDAGES/DRESSINGS) ×1 IMPLANT
GLOVE BIO SURGEON STRL SZ7 (GLOVE) ×2 IMPLANT
GLOVE BIO SURGEON STRL SZ7.5 (GLOVE) ×2 IMPLANT
KIT BASIN OR (CUSTOM PROCEDURE TRAY) ×2 IMPLANT
KIT ROOM TURNOVER OR (KITS) ×2 IMPLANT
NEEDLE 22X1 1/2 (OR ONLY) (NEEDLE) ×1 IMPLANT
PACK ORTHO EXTREMITY (CUSTOM PROCEDURE TRAY) ×2 IMPLANT
PAD CAST 3X4 CTTN HI CHSV (CAST SUPPLIES) ×1 IMPLANT
PAD CAST 4YDX4 CTTN HI CHSV (CAST SUPPLIES) ×1 IMPLANT
PADDING CAST COTTON 3X4 STRL (CAST SUPPLIES)
PADDING CAST COTTON 4X4 STRL (CAST SUPPLIES)
PADDING WEBRIL 2 STERILE (GAUZE/BANDAGES/DRESSINGS) ×1 IMPLANT
PEG SUBCHONDRAL SMOOTH 2.0X20 (Peg) ×6 IMPLANT
PEG THREADED 2.5MMX18MM LONG (Peg) ×2 IMPLANT
PEG THREADED 2.5MMX20MM LONG (Peg) ×2 IMPLANT
PLATE STAN 24.4X59.5 RT (Plate) ×1 IMPLANT
SCREW BN 12X3.5XNS CORT TI (Screw) IMPLANT
SCREW CORT 3.5X10 LNG (Screw) ×1 IMPLANT
SCREW CORT 3.5X12 (Screw) ×4 IMPLANT
SPONGE GAUZE 4X4 12PLY (GAUZE/BANDAGES/DRESSINGS) ×2 IMPLANT
SPONGE LAP 4X18 X RAY DECT (DISPOSABLE) ×2 IMPLANT
STRIP CLOSURE SKIN 1/2X4 (GAUZE/BANDAGES/DRESSINGS) ×2 IMPLANT
SUCTION FRAZIER TIP 10 FR DISP (SUCTIONS) ×2 IMPLANT
SUT ETHILON 4 0 PS 2 18 (SUTURE) IMPLANT
SUT VIC AB 2-0 CTB1 (SUTURE) ×2 IMPLANT
SUT VIC AB 3-0 FS2 27 (SUTURE) ×2 IMPLANT
SYR CONTROL 10ML LL (SYRINGE) IMPLANT
TOWEL OR 17X24 6PK STRL BLUE (TOWEL DISPOSABLE) ×2 IMPLANT
TOWEL OR 17X26 10 PK STRL BLUE (TOWEL DISPOSABLE) ×2 IMPLANT
TUBE CONNECTING 12X1/4 (SUCTIONS) ×2 IMPLANT
UNDERPAD 30X30 INCONTINENT (UNDERPADS AND DIAPERS) ×2 IMPLANT

## 2011-10-01 NOTE — Anesthesia Procedure Notes (Addendum)
Anesthesia Regional Block:  Supraclavicular block  Pre-Anesthetic Checklist: ,, timeout performed, Correct Patient, Correct Site, Correct Laterality, Correct Procedure, Correct Position, site marked, Risks and benefits discussed,  Surgical consent,  Pre-op evaluation,  At surgeon's request and post-op pain management  Laterality: Right and Upper  Prep: chloraprep       Needles:  Injection technique: Single-shot  Needle Type: Echogenic Stimulator Needle     Needle Length: 5cm 5 cm Needle Gauge: 22 and 22 G    Additional Needles:  Procedures: ultrasound guided Supraclavicular block Narrative:  Start time: 10/01/2011 12:52 PM End time: 10/01/2011 1:02 PM Injection made incrementally with aspirations every 5 mL.  Performed by: Personally  Anesthesiologist: Sheldon Silvan  Additional Notes: Marcaine on Intraop med list.  Supraclavicular block Procedure Name: LMA Insertion Date/Time: 10/01/2011 1:23 PM Performed by: Rossie Muskrat Pre-anesthesia Checklist: Patient identified, Timeout performed, Emergency Drugs available, Suction available and Patient being monitored Patient Re-evaluated:Patient Re-evaluated prior to inductionOxygen Delivery Method: Circle System Utilized Preoxygenation: Pre-oxygenation with 100% oxygen Intubation Type: IV induction Ventilation: Mask ventilation without difficulty LMA: LMA inserted LMA Size: 4.0 Number of attempts: 1 Tube secured with: Tape Dental Injury: Teeth and Oropharynx as per pre-operative assessment

## 2011-10-01 NOTE — Transfer of Care (Signed)
Immediate Anesthesia Transfer of Care Note  Patient: [name]  Procedure(s) Performed:  OPEN REDUCTION INTERNAL FIXATION (ORIF) ULNAR FRACTURE - and orif scaphoid  Patient Location: PACU  Anesthesia Type: General  Level of Consciousness: awake, alert  and oriented  Airway & Oxygen Therapy: Patient Spontanous Breathing and Patient connected to nasal cannula oxygen  Post-op Assessment: Report given to PACU RN, Post -op Vital signs reviewed and stable and Patient moving all extremities X 4  Post vital signs: Reviewed and stable  Complications: No apparent anesthesia complications

## 2011-10-01 NOTE — Brief Op Note (Signed)
10/01/2011  2:30 PM  PATIENT:  [name]  60 y.o. female  PRE-OPERATIVE DIAGNOSIS:  left distal radius fracture   POST-OPERATIVE DIAGNOSIS:  left distal radius fracture.  PROCEDURE:  Procedure(s): OPEN REDUCTION INTERNAL FIXATION (ORIF) ULNAR FRACTURE  SURGEON:  Surgeon(s): Nestor Lewandowsky  PHYSICIAN ASSISTANT: Mauricia Area    ANESTHESIA:   regional and general  EBL:     BLOOD ADMINISTERED:none  DRAINS: none   LOCAL MEDICATIONS USED:  NONE  SPECIMEN:  No Specimen  DISPOSITION OF SPECIMEN:  N/A  COUNTS:  YES  TOURNIQUET:   Total Tourniquet Time Documented: Upper Arm (Right) - 33 minutes  DICTATION: .Reubin Milan Dictation  PLAN OF CARE: Discharge to home after PACU  PATIENT DISPOSITION:  PACU - hemodynamically stable.   Delay start of Pharmacological VTE agent (>24hrs) due to surgical blood loss or risk of bleeding:  {YES/NO/NOT APPLICABLE:20182

## 2011-10-01 NOTE — Addendum Note (Signed)
Addendum  created 10/01/11 1743 by Kerby Nora, MD   Modules edited:Orders

## 2011-10-01 NOTE — Anesthesia Postprocedure Evaluation (Signed)
  Anesthesia Post-op Note  Patient: [name]  Procedure(s) Performed:  OPEN REDUCTION INTERNAL FIXATION (ORIF) ULNAR FRACTURE - and orif scaphoid  Patient Location: PACU  Anesthesia Type:   Level of Consciousness: awake and alert   Airway and Oxygen Therapy: Patient Spontanous Breathing and Patient connected to face mask  Post-op Pain: moderate  Post-op Assessment: Post-op Vital signs reviewed, Patient's Cardiovascular Status Stable, Respiratory Function Stable, Patent Airway, No signs of Nausea or vomiting and Pain level controlled  Post-op Vital Signs: Reviewed and stable  Complications: No apparent anesthesia complications

## 2011-10-01 NOTE — Addendum Note (Signed)
Addendum  created 10/01/11 1613 by Kerby Nora, MD   Modules edited:Orders

## 2011-10-01 NOTE — Preoperative (Signed)
Beta Blockers   Reason not to administer Beta Blockers:Pt does not take B Blockers 

## 2011-10-01 NOTE — Progress Notes (Signed)
Dr Turner Daniels paged and message left for him that patient is having right wrist surgery, orders are for left wrist.

## 2011-10-01 NOTE — Op Note (Signed)
Preoperative diagnosis: Right distal radius fracture  Postoperative diagnosis: Same  Procedure: Open reduction internal fixation right distal radius fracture with a Biomet DVR plate.  Surgeon Feliberto Gottron. Ron M.D.  First Asst. Shirl Harris PA-C  Anesthetic: Gen. LMA and interscalene block  Estimated blood loss: 100 cc  Fluid replacement: 1500 cc  Tourniquet time: 31 minutes  Indications for procedure: 60 year old woman who slipped and fell a few days ago and sustained an angulated and shortened right distal radius fracture. Space and she was initially splinted was seen in our clinic and we discussed the possibility of open reduction internal fixation to restore the length of her distal radius and decrease the angulation deformity and decrease pain. Resume this of surgery discussed with the patient all questions answered and she is prepared for surgical intervention.  Description of procedure: The patient was identified by arm band are C. preoperative IV antibiotics [facility] She was taken to operating room one or the progress that monitors were attached and general LMA anesthesia was induced with the patient in the supine position. Prior to that she had a right interscalene block anesthetic in the holding area. At this point a tourniquet was applied to the right forearm and the right upper tremor he prepped draped in usual fashion from the fingertips to the tourniquet and a timeout procedure was performed. The limb was wrapped with an Esmarch bandage the tourniquet inflated to 250 mmHg and we began the operation by making an anterior midline incision starting at the wrist flexion crease and going proximally for about 10-12 cm over the FCR tendon. Small bleeders were identified and cauterized the tendon sheath was incised in the FCR tendon retracted radially. We then incised the fascia over the pronator quadratus and reflected off of its insertion on the distal  radius. This exposed the fracture site which was then carefully debrided with small Rongeurs. Under direct visualization the fracture was then reduced and a standard right DVR plate was applied with a single screw in the gliding hole to provide provisional fixation. One of the distal row screw holes was then filled with a 20 mm partially threaded screw with the fracture held reduced. The small C-arm was brought in to obtain images confirming a near-anatomic reduction in the AP and lateral views. At this point all the screws in the distal row were filled with partially threaded screws and a proximal row with PEG screws. The bicortical screws proximally were then filled one 2 and 3 and the most proximal screw was left open. Final C-arm images were taken confirming a near-anatomic reduction. The tourniquet was let down small bleeders were identified and cauterized with then closed the subcutaneous tissue with 3-0 Vicryl suture and subcuticularly also a 3-0 Vicryl suture a dressing of Xeroform 4 x 4 dressing sponges web roll and 2 inch Ace wrap followed by a small Velcro splint was then applied at this point the patient was awakened extubated and taken to the recovery room without difficulty.

## 2011-10-01 NOTE — Anesthesia Preprocedure Evaluation (Addendum)
Anesthesia Evaluation  Patient identified by MRN, date of birth, ID band Patient awake    Reviewed: Allergy & Precautions, H&P , NPO status , Patient's Chart, lab work & pertinent test results  Airway Mallampati: I TM Distance: >3 FB Neck ROM: Full    Dental  (+) Edentulous Upper and Edentulous Lower   Pulmonary    Pulmonary exam normal       Cardiovascular hypertension, Angina: Ptr has no chest pain or SOB. + CAD     Neuro/Psych    GI/Hepatic   Endo/Other  Diabetes mellitus-, Type 2, Oral Hypoglycemic Agents  Renal/GU      Musculoskeletal   Abdominal   Peds  Hematology   Anesthesia Other Findings   Reproductive/Obstetrics                         Anesthesia Physical Anesthesia Plan  ASA: III  Anesthesia Plan: General   Post-op Pain Management:    Induction: Intravenous  Airway Management Planned: LMA  Additional Equipment:   Intra-op Plan:   Post-operative Plan: Extubation in OR  Informed Consent: I have reviewed the patients History and Physical, chart, labs and discussed the procedure including the risks, benefits and alternatives for the proposed anesthesia with the patient or authorized representative who has indicated his/her understanding and acceptance.   Dental advisory given  Plan Discussed with: CRNA and Surgeon  Anesthesia Plan Comments:         Anesthesia Quick Evaluation

## 2011-10-01 NOTE — Interval H&P Note (Signed)
History and Physical Interval Note:   10/01/2011   12:21 PM   [name]  has presented today for surgery, with the diagnosis of Right distal radius fracture  The various methods of treatment have been discussed with the patient and family. After consideration of risks, benefits and other options for treatment, the patient has consented to  Procedure(s): OPEN REDUCTION INTERNAL FIXATION (ORIF) SCAPHOID WITH DISTAL RADIUS GRAFT as a surgical intervention .  The patients' history has been reviewed, patient examined, no change in status, stable for surgery.  I have reviewed the patients' chart and labs.  Questions were answered to the patient's satisfaction.     Nestor Lewandowsky  MD

## 2011-10-05 ENCOUNTER — Encounter (HOSPITAL_COMMUNITY): Payer: Self-pay | Admitting: Orthopedic Surgery

## 2011-10-19 ENCOUNTER — Encounter: Payer: Self-pay | Admitting: Cardiology

## 2011-10-21 ENCOUNTER — Ambulatory Visit (INDEPENDENT_AMBULATORY_CARE_PROVIDER_SITE_OTHER): Payer: Medicaid Other | Admitting: Physician Assistant

## 2011-10-21 ENCOUNTER — Encounter: Payer: Self-pay | Admitting: Cardiology

## 2011-10-21 DIAGNOSIS — E1159 Type 2 diabetes mellitus with other circulatory complications: Secondary | ICD-10-CM | POA: Insufficient documentation

## 2011-10-21 DIAGNOSIS — I639 Cerebral infarction, unspecified: Secondary | ICD-10-CM

## 2011-10-21 DIAGNOSIS — E119 Type 2 diabetes mellitus without complications: Secondary | ICD-10-CM

## 2011-10-21 DIAGNOSIS — IMO0001 Reserved for inherently not codable concepts without codable children: Secondary | ICD-10-CM

## 2011-10-21 DIAGNOSIS — I6350 Cerebral infarction due to unspecified occlusion or stenosis of unspecified cerebral artery: Secondary | ICD-10-CM

## 2011-10-21 DIAGNOSIS — I779 Disorder of arteries and arterioles, unspecified: Secondary | ICD-10-CM

## 2011-10-21 DIAGNOSIS — I25810 Atherosclerosis of coronary artery bypass graft(s) without angina pectoris: Secondary | ICD-10-CM

## 2011-10-21 DIAGNOSIS — I10 Essential (primary) hypertension: Secondary | ICD-10-CM

## 2011-10-21 DIAGNOSIS — E785 Hyperlipidemia, unspecified: Secondary | ICD-10-CM

## 2011-10-21 HISTORY — DX: Disorder of arteries and arterioles, unspecified: I77.9

## 2011-10-21 NOTE — Progress Notes (Signed)
Cc: CAD  HPI: Patient presents for annual followup.  Since last seen here in clinic, by Dr. Andee Lineman, 2/11, she denies any interim development of exertional CP, DOE, or symptoms suggestive of CHF. She had a recent fall resulting in right wrist fracture, requiring surgical repair approximately 3 weeks ago. This was not associated with syncope. She did not require overnight hospitalization. Plavix was held for 4 days prior to surgery,  and subsequently resumed.  She remains quite active, according to her husband. She denies any prior history of tobacco smoking.  PMH: reviewed and listed in Problem List in electronic Records (and see below)  Allergies/SH/FH: available in Electronic Records for review  Current Outpatient Prescriptions  Medication Sig Dispense Refill  . alendronate (FOSAMAX) 70 MG tablet Take 70 mg by mouth every 7 (seven) days. Take with a full glass of water on an empty stomach.       Marland Kitchen aspirin 81 MG chewable tablet Chew 81 mg by mouth daily.        . citalopram (CELEXA) 20 MG tablet Take 10 mg by mouth daily. Takes 1/2      . clopidogrel (PLAVIX) 75 MG tablet Take 75 mg by mouth daily. Has not had since 09/28/11       . folic acid (FOLVITE) 1 MG tablet Take 1 mg by mouth daily.        Marland Kitchen gabapentin (NEURONTIN) 300 MG capsule Take 300 mg by mouth 2 times daily at 12 noon and 4 pm.       . insulin aspart (NOVOLOG) 100 UNIT/ML injection Inject 5 Units into the skin Nightly.        . insulin glargine (LANTUS) 100 UNIT/ML injection Inject 18 Units into the skin at bedtime.       Marland Kitchen letrozole (FEMARA) 2.5 MG tablet Take 2.5 mg by mouth daily.        . metFORMIN (GLUCOPHAGE) 500 MG tablet Take 1,000 mg by mouth 2 (two) times daily with a meal.       . oxyCODONE (ROXICODONE) 15 MG immediate release tablet Take 5 mg by mouth every 4 (four) hours as needed.        . rosuvastatin (CRESTOR) 10 MG tablet Take 5 mg by mouth every evening.          ROS: no nausea, vomiting; no fever, chills;  no melena, hematochezia; no claudication  PHYSICAL EXAM:  BP 127/71  Pulse 61  Resp 16  Ht 5\' 5"  (1.651 m)  Wt 136 lb (61.689 kg)  BMI 22.63 kg/m2 GENERAL: 60 year old female, sitting upright; NAD HEENT: NCAT, PERRLA, EOMI; sclera clear; no xanthelasma NECK: palpable bilateral carotid pulses, no bruits; no JVD; no TM LUNGS: CTA bilaterally CARDIAC: RRR (S1, S2); 2-3/6 harsh systolic ejection murmur at the base; no rubs or gallops ABDOMEN: soft, non-tender; intact BS EXTREMETIES: 1+ bilateral, nonpitting peripheral edema SKIN: warm/dry; no obvious rash/lesions MUSCULOSKELETAL: no joint deformity NEURO: no focal deficit; NL affect   EKG: reviewed and available in Electronic Records   ASSESSMENT & PLAN:

## 2011-10-21 NOTE — Assessment & Plan Note (Signed)
Followed by Dr. Qureshi. 

## 2011-10-21 NOTE — Patient Instructions (Signed)
Continue all current medications. Your physician wants you to follow up in: 6 months.  You will receive a reminder letter in the mail one-two months in advance.  If you don't receive a letter, please call our office to schedule the follow up appointment   

## 2011-10-21 NOTE — Assessment & Plan Note (Signed)
Quiescent on current medication regimen. No current indication for ischemic evaluation. Will consider a surveillance stress test, at time of next OV in 6 months, to rule out occult ischemia.

## 2011-10-21 NOTE — Assessment & Plan Note (Signed)
Followed by Dr. Sethi 

## 2011-10-21 NOTE — Assessment & Plan Note (Signed)
Well-controlled on current medication regimen 

## 2011-10-21 NOTE — Assessment & Plan Note (Signed)
Followed by Dr. Pearlean Brownie

## 2011-10-21 NOTE — Assessment & Plan Note (Signed)
We'll request most recent FLP from Dr. Waynard Reeds office. Aggressive management recommended with target LDL 70 or less, if feasible. Continue current dose of Crestor, pending further recommendations.

## 2012-01-24 IMAGING — CR DG CHEST 2V
2 series · 2 of 2 positions shown · non-contrast
Comparison: 05/11/2007

CLINICAL DATA: Breast cancer.  Preop respiratory exam.

CHEST - 2 VIEW

[view not recorded (1 of 2)]
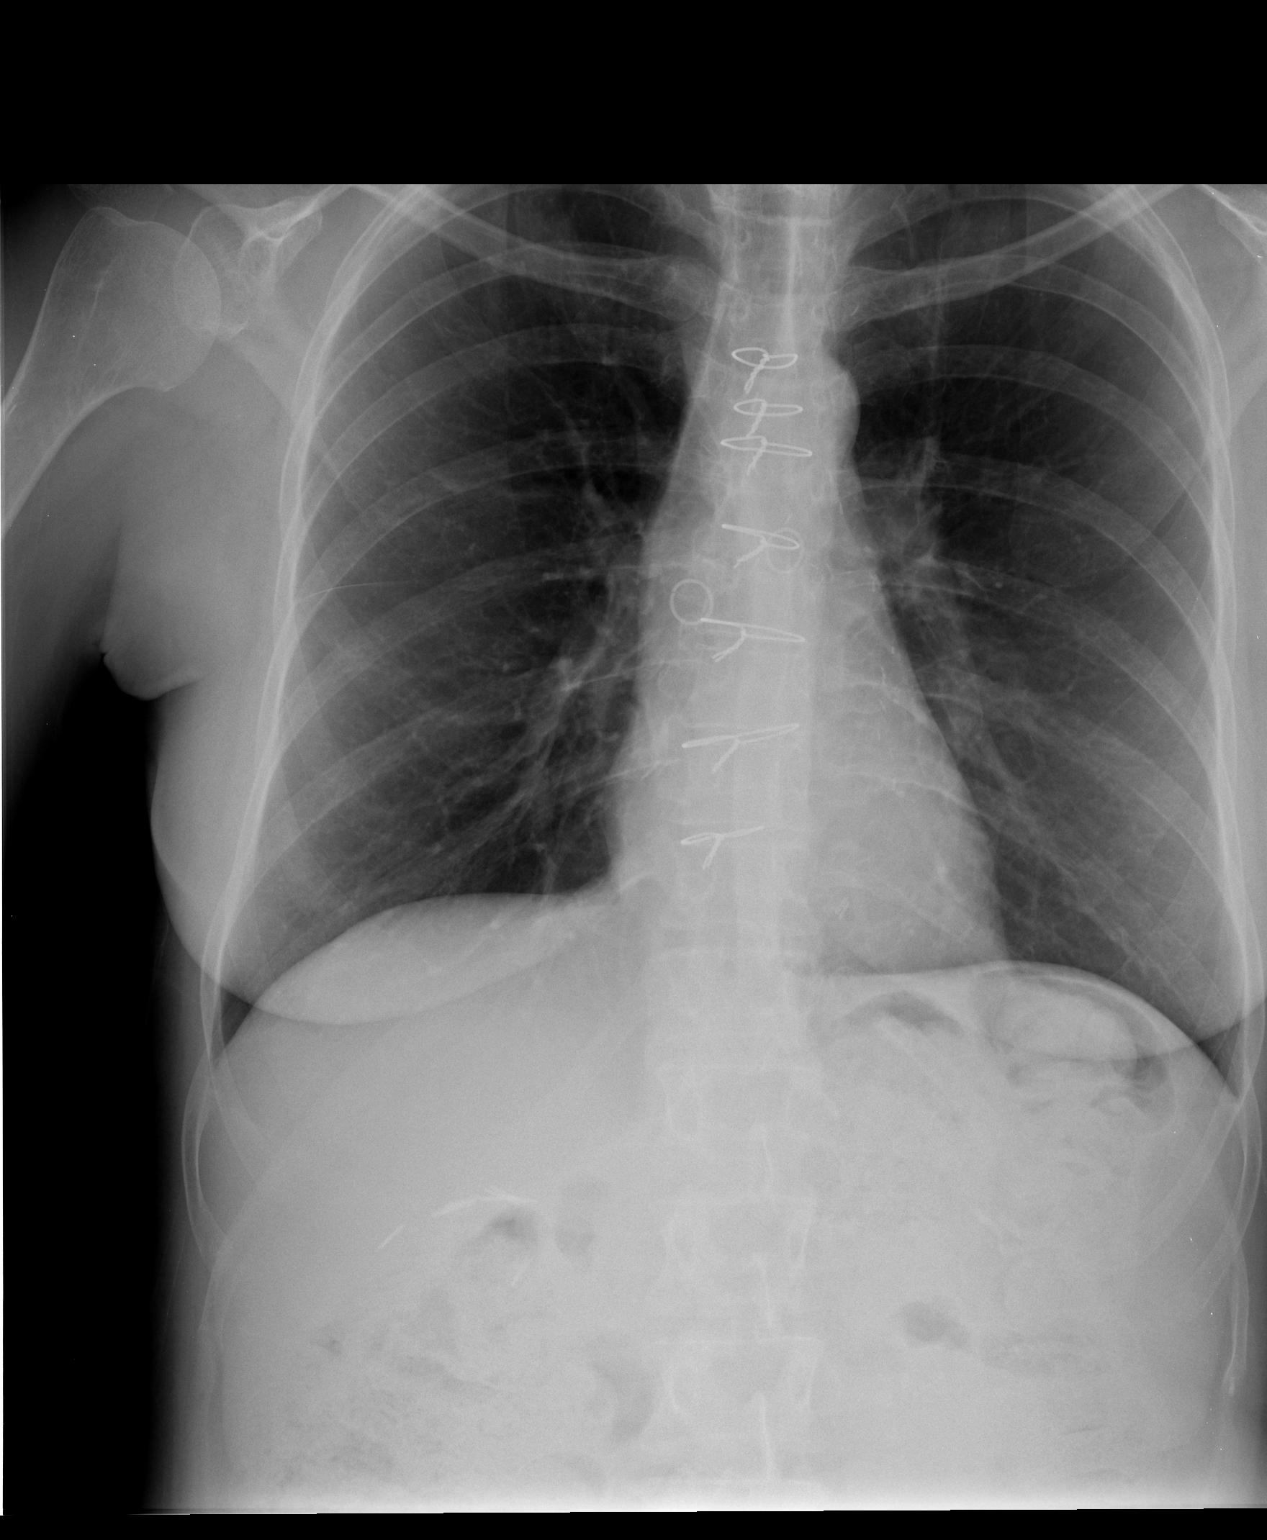

[view not recorded (2 of 2)]
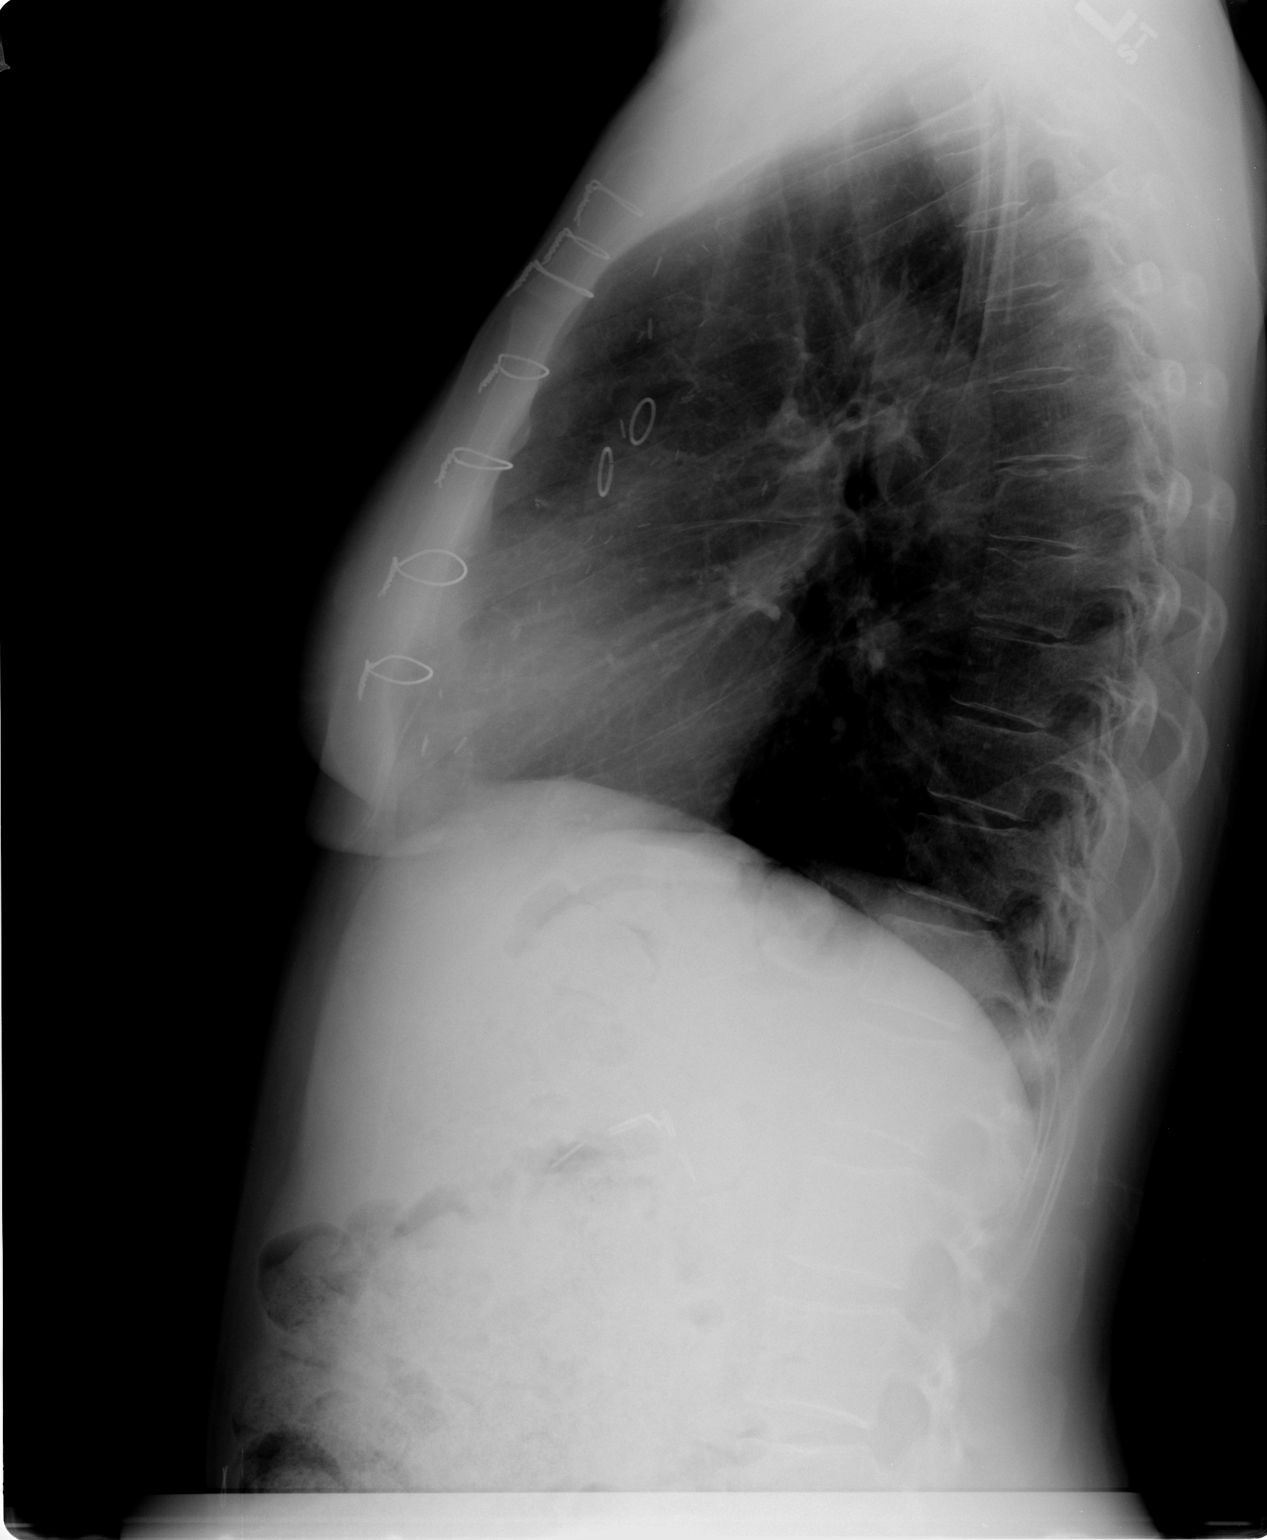

[2 of 2 positions shown; findings below may reference images not displayed]

FINDINGS: There are stable surgical changes from bypass surgery.
The cardiac silhouette, mediastinal and hilar contours are within
normal limits.  The lungs are clear.  The bony thorax is intact.
IMPRESSION: No acute cardiopulmonary findings.

## 2012-05-17 ENCOUNTER — Ambulatory Visit: Payer: Medicaid Other | Admitting: Cardiology

## 2012-06-05 ENCOUNTER — Encounter: Payer: Self-pay | Admitting: Cardiology

## 2012-06-05 ENCOUNTER — Ambulatory Visit (INDEPENDENT_AMBULATORY_CARE_PROVIDER_SITE_OTHER): Payer: Medicaid Other | Admitting: Cardiology

## 2012-06-05 ENCOUNTER — Other Ambulatory Visit: Payer: Self-pay | Admitting: *Deleted

## 2012-06-05 VITALS — BP 130/70 | HR 60 | Ht 65.0 in | Wt 135.1 lb

## 2012-06-05 DIAGNOSIS — I639 Cerebral infarction, unspecified: Secondary | ICD-10-CM

## 2012-06-05 DIAGNOSIS — I10 Essential (primary) hypertension: Secondary | ICD-10-CM

## 2012-06-05 DIAGNOSIS — Z79899 Other long term (current) drug therapy: Secondary | ICD-10-CM

## 2012-06-05 DIAGNOSIS — I6350 Cerebral infarction due to unspecified occlusion or stenosis of unspecified cerebral artery: Secondary | ICD-10-CM

## 2012-06-05 DIAGNOSIS — E785 Hyperlipidemia, unspecified: Secondary | ICD-10-CM

## 2012-06-05 DIAGNOSIS — I25810 Atherosclerosis of coronary artery bypass graft(s) without angina pectoris: Secondary | ICD-10-CM

## 2012-06-05 MED ORDER — RAMIPRIL 2.5 MG PO CAPS: 2.5000 mg | ORAL_CAPSULE | Freq: Every day | ORAL | Status: DC

## 2012-06-05 NOTE — Assessment & Plan Note (Signed)
Check lipids/LFTs with goal LDL < 70.  

## 2012-06-05 NOTE — Progress Notes (Signed)
Patient ID: [name], female   DOB: 02/18/1951, 61 y.o.   MRN: [id number] PCP: Dr. Virgina Organ  61 yo with history of CAD s/p CABG and CVA x 2 presents for cardiology followup.  She has been seen by Dr. Earnestine Leys in the past and is seen by me for the first time today.  Since she was last seen in this office, she has been stable symptomatically.  No chest pain.  She gardens and does yardwork without exertional dyspnea.  She does have some gait instability that has been present since her last stroke.  She falls occasionally (mechanical falls due to tripping).  No syncope, no claudication, no palpitations.    Labs (2/13): K 4.2, creatinine 0.56  PMH: 1. CAD: s/p CABG in 12/07.  Echo 5/09 with EF 60%, no significant valvular abnormalities.  2. HTN 3. DM 4. Hyperlipidemia 5. Depression 6. Breast cancer: Now on letrozole.  Followed by Dr. Cleone Slim.  7. CVA x 2.  Has cerebrovascular disease and sees Dr. Pearlean Brownie regularly.   SH: Lives in [place], married, not working.  She does not smoke.  FH: CAD  ROS: All systems reviewed and negative except as per HPI.

## 2012-06-05 NOTE — Assessment & Plan Note (Signed)
No ischemic symptoms.  Continue ASA 81, statin.  I will add ramipril 2.5 mg daily for secondary prevention.  It will also be helpful for renal protection in the setting of insulin-dependent diabetes.  BMET in 1 week.

## 2012-06-05 NOTE — Patient Instructions (Addendum)
Your physician recommends that you schedule a follow-up appointment in: 1 year with Dr. Andee Lineman. You will receive a reminder letter in the mail in about 10 months reminding you to call and schedule your appointment. If you don't receive this letter, please contact our office.  Your physician has recommended you make the following change in your medication: start ramipril 2.5 mg daily. Your new prescription has been sent to your pharmacy. Your physician recommends that you return for a FASTING lipid/liver profile & BMET at [facility].

## 2012-06-05 NOTE — Assessment & Plan Note (Signed)
Prior CVAs with some gait instability.  She needs to use a cane given risk of bleeding on Plavix and aspirin.

## 2012-06-08 ENCOUNTER — Encounter (INDEPENDENT_AMBULATORY_CARE_PROVIDER_SITE_OTHER): Payer: Medicaid Other | Admitting: Hematology and Oncology

## 2012-06-08 DIAGNOSIS — M949 Disorder of cartilage, unspecified: Secondary | ICD-10-CM

## 2012-06-08 DIAGNOSIS — E119 Type 2 diabetes mellitus without complications: Secondary | ICD-10-CM

## 2012-06-08 DIAGNOSIS — M899 Disorder of bone, unspecified: Secondary | ICD-10-CM

## 2012-06-08 DIAGNOSIS — C50919 Malignant neoplasm of unspecified site of unspecified female breast: Secondary | ICD-10-CM

## 2012-07-03 DIAGNOSIS — C50919 Malignant neoplasm of unspecified site of unspecified female breast: Secondary | ICD-10-CM

## 2012-08-01 ENCOUNTER — Encounter: Payer: Self-pay | Admitting: Cardiology

## 2012-12-26 ENCOUNTER — Other Ambulatory Visit: Payer: Self-pay | Admitting: Cardiology

## 2013-01-09 DIAGNOSIS — C50919 Malignant neoplasm of unspecified site of unspecified female breast: Secondary | ICD-10-CM

## 2013-01-09 DIAGNOSIS — M899 Disorder of bone, unspecified: Secondary | ICD-10-CM

## 2013-01-09 DIAGNOSIS — M949 Disorder of cartilage, unspecified: Secondary | ICD-10-CM

## 2013-07-06 ENCOUNTER — Other Ambulatory Visit: Payer: Self-pay | Admitting: Cardiology

## 2013-07-06 MED ORDER — RAMIPRIL 2.5 MG PO CAPS: 2.5000 mg | ORAL_CAPSULE | Freq: Every day | ORAL | Status: DC

## 2013-07-30 ENCOUNTER — Encounter (INDEPENDENT_AMBULATORY_CARE_PROVIDER_SITE_OTHER): Payer: Medicaid Other

## 2013-07-30 DIAGNOSIS — M899 Disorder of bone, unspecified: Secondary | ICD-10-CM

## 2013-07-30 DIAGNOSIS — M949 Disorder of cartilage, unspecified: Secondary | ICD-10-CM

## 2013-07-30 DIAGNOSIS — C50919 Malignant neoplasm of unspecified site of unspecified female breast: Secondary | ICD-10-CM

## 2013-08-08 ENCOUNTER — Other Ambulatory Visit: Payer: Self-pay | Admitting: Cardiology

## 2013-08-08 MED ORDER — RAMIPRIL 2.5 MG PO CAPS: 2.5000 mg | ORAL_CAPSULE | Freq: Every day | ORAL | Status: DC

## 2013-08-29 ENCOUNTER — Telehealth: Payer: Self-pay | Admitting: Cardiology

## 2013-08-29 MED ORDER — RAMIPRIL 2.5 MG PO CAPS: 2.5000 mg | ORAL_CAPSULE | Freq: Every day | ORAL | Status: DC

## 2013-08-29 NOTE — Telephone Encounter (Signed)
Received fax refill request  Rx # [id number] Medication:  Ramipril 2.5mg  Qty 20 Sig:  Take one capsule by mouth once daily Physician:  Diona Browner

## 2013-09-03 ENCOUNTER — Telehealth: Payer: Self-pay

## 2013-09-03 NOTE — Telephone Encounter (Signed)
Received fax refill request  Duplicate request.  Disregard already addressed.

## 2013-09-14 ENCOUNTER — Other Ambulatory Visit: Payer: Self-pay | Admitting: Cardiology

## 2014-03-25 ENCOUNTER — Encounter: Payer: Self-pay | Admitting: Cardiology

## 2014-05-23 ENCOUNTER — Encounter: Payer: Self-pay | Admitting: Cardiology

## 2014-07-29 ENCOUNTER — Encounter: Payer: Self-pay | Admitting: Cardiology

## 2014-12-20 ENCOUNTER — Encounter: Payer: Self-pay | Admitting: *Deleted

## 2014-12-20 ENCOUNTER — Encounter: Payer: Self-pay | Admitting: Cardiology

## 2014-12-20 ENCOUNTER — Ambulatory Visit (INDEPENDENT_AMBULATORY_CARE_PROVIDER_SITE_OTHER): Payer: Medicaid Other | Admitting: Cardiology

## 2014-12-20 VITALS — BP 154/78 | HR 68 | Ht 65.0 in | Wt 148.0 lb

## 2014-12-20 DIAGNOSIS — R011 Cardiac murmur, unspecified: Secondary | ICD-10-CM

## 2014-12-20 DIAGNOSIS — E785 Hyperlipidemia, unspecified: Secondary | ICD-10-CM

## 2014-12-20 DIAGNOSIS — I10 Essential (primary) hypertension: Secondary | ICD-10-CM

## 2014-12-20 DIAGNOSIS — I2510 Atherosclerotic heart disease of native coronary artery without angina pectoris: Secondary | ICD-10-CM

## 2014-12-20 MED ORDER — FUROSEMIDE 20 MG PO TABS: ORAL_TABLET | ORAL | Status: DC

## 2014-12-20 MED ORDER — ATORVASTATIN CALCIUM 80 MG PO TABS: 80.0000 mg | ORAL_TABLET | Freq: Every day | ORAL | Status: DC

## 2014-12-20 NOTE — Patient Instructions (Addendum)
Your physician wants you to follow-up in: [place] DR. BRANCH You will receive a reminder letter in the mail two months in advance. If you don't receive a letter, please call our office to schedule the follow-up appointment.   Your physician has requested that you have an echocardiogram. Echocardiography is a painless test that uses sound waves to create images of your heart. It provides your doctor with information about the size and shape of your heart and how well your heart's chambers and valves are working. This procedure takes approximately one hour. There are no restrictions for this procedure.   Your physician has recommended you make the following change in your medication:   START LASIX 20 MG DAILY AS NEEDED FOR SWELLING  INCREASE ATORVASTATIN 80 MG DAILY   WE WILL REQUEST LAB RESULTS FROM DR. Eula Fried  Your physician has requested that you regularly monitor and record your blood pressure readings at home [address]. Please use the same machine at the same time of day to check your readings and record them to bring to your follow-up visit.  Thank you for choosing [facility]!!

## 2014-12-20 NOTE — Progress Notes (Signed)
Clinical Summary [name] is a 64 y.o.female last seen by Dr Aundra Dubin, this is our first visit together. She is seen for the following medical problems.  1. CAD - hx of CABG in 2007, normal LVEF 60% - occasional chest pain. Left chest, 1-2/10. Only occurs with moving left arm. - denies any SOB or DOE -compliant with meds  2. HTN - reports history of low bp's, had to stop meds in the past  3. HL - compliantwith statin  4. Hx of CVA - she is on both ASA and plavix, she is unsure exactly why. Assume its related to her hx of CVA as no clear cardiac indication to be on plavix from records  Past Medical History  Diagnosis Date  . Diabetes mellitus   . Hypertension   . Cancer     Left mastectomy, 2/11  . Neuropathic pain, leg   . Coronary artery disease     cabg 2007  . Neuromuscular disorder   . Anxiety   . Arthritis   . HLD (hyperlipidemia)   . Stroke, lacunar     Followed by Dr. Leonie Man  . Carotid disease, bilateral     Followed by Dr. Leonie Man     No Known Allergies   Current Outpatient Prescriptions  Medication Sig Dispense Refill  . ACCU-CHEK AVIVA PLUS test strip 1 each by Other route as needed.     Marland Kitchen alendronate (FOSAMAX) 70 MG tablet Take 70 mg by mouth every 7 (seven) days. Take with a full glass of water on an empty stomach.     Marland Kitchen aspirin 81 MG chewable tablet Chew 81 mg by mouth daily.      . B-D UF III MINI PEN NEEDLES 31G X 5 MM MISC as needed.     . citalopram (CELEXA) 20 MG tablet Take 10 mg by mouth daily. Takes 1/2    . clopidogrel (PLAVIX) 75 MG tablet Take 75 mg by mouth daily. Has not had since 09/28/11     . folic acid (FOLVITE) 1 MG tablet Take 1 mg by mouth daily.      Marland Kitchen gabapentin (NEURONTIN) 300 MG capsule Take 300 mg by mouth 2 times daily at 12 noon and 4 pm.     . insulin aspart (NOVOLOG) 100 UNIT/ML injection Inject 5 Units into the skin Nightly.      . insulin glargine (LANTUS) 100 UNIT/ML injection Inject 18 Units into the skin at  bedtime.     Marland Kitchen letrozole (FEMARA) 2.5 MG tablet Take 2.5 mg by mouth daily.      . metFORMIN (GLUCOPHAGE) 500 MG tablet Take 1,000 mg by mouth 2 (two) times daily with a meal.     . oxyCODONE (ROXICODONE) 15 MG immediate release tablet Take 5 mg by mouth every 4 (four) hours as needed.      . ramipril (ALTACE) 2.5 MG capsule TAKE ONE CAPSULE BY MOUTH ONCE DAILY (NEEDS OFFICE VISIT) 5 capsule 0  . rosuvastatin (CRESTOR) 10 MG tablet Take 5 mg by mouth every evening.       No current facility-administered medications for this visit.     Past Surgical History  Procedure Laterality Date  . Coronary artery bypass graft    . Fracture surgery    . Mastectomy  2010    lt-nodes  . Orif ulnar fracture  10/01/2011    Procedure: OPEN REDUCTION INTERNAL FIXATION (ORIF) ULNAR FRACTURE;  Surgeon: Kerin Salen;  Location: [facility] OR;  Service:  Orthopedics;  Laterality: Right;  and orif scaphoid     No Known Allergies    No family history on file.   Social History [name] reports that she has never smoked. She does not have any smokeless tobacco history on file. [name] reports that she does not drink alcohol.   Review of Systems CONSTITUTIONAL: No weight loss, fever, chills, weakness or fatigue.  HEENT: Eyes: No visual loss, blurred vision, double vision or yellow sclerae.No hearing loss, sneezing, congestion, runny nose or sore throat.  SKIN: No rash or itching.  CARDIOVASCULAR: per HPI RESPIRATORY: No shortness of breath, cough or sputum.  GASTROINTESTINAL: No anorexia, nausea, vomiting or diarrhea. No abdominal pain or blood.  GENITOURINARY: No burning on urination, no polyuria NEUROLOGICAL: No headache, dizziness, syncope, paralysis, ataxia, numbness or tingling in the extremities. No change in bowel or bladder control.  MUSCULOSKELETAL: No muscle, back pain, joint pain or stiffness.  LYMPHATICS: No enlarged nodes. No history of splenectomy.  PSYCHIATRIC: No history of depression or  anxiety.  ENDOCRINOLOGIC: No reports of sweating, cold or heat intolerance. No polyuria or polydipsia.  Marland Kitchen   Physical Examination p 68 bp 160/80 Wt 148 lbs BMI 25 Gen: resting comfortably, no acute distress HEENT: no scleral icterus, pupils equal round and reactive, no palptable cervical adenopathy,  CV: RRR, no 3/6 systolic murmur at apex, no JVD,  No carotid bruits Resp: Clear to auscultation bilaterally GI: abdomen is soft, non-tender, non-distended, normal bowel sounds, no hepatosplenomegaly MSK: extremities are warm, no edema.  Skin: warm, no rash Neuro:  no focal deficits Psych: appropriate affect     Assessment and Plan  1. CAD - no current symptoms, continue risk factor modification and secondary prevention  2. HTN - elevated in clinic, appears in the past bp meds discontinued due to low bp's - she will keep bp log and submit in 2 weeks  3. HL - change to high dose statin in setting of known CAD, change to atorvastatin 80mg  daily  4. CVA - continue secondary prevention  5. Heart murmur - obtain echo - start lasix prn for swelling    Arnoldo Lenis, M.D.

## 2014-12-25 ENCOUNTER — Other Ambulatory Visit: Payer: Self-pay

## 2014-12-25 ENCOUNTER — Other Ambulatory Visit (INDEPENDENT_AMBULATORY_CARE_PROVIDER_SITE_OTHER): Payer: Medicaid Other

## 2014-12-25 DIAGNOSIS — I2510 Atherosclerotic heart disease of native coronary artery without angina pectoris: Secondary | ICD-10-CM

## 2014-12-25 DIAGNOSIS — R011 Cardiac murmur, unspecified: Secondary | ICD-10-CM

## 2014-12-31 ENCOUNTER — Telehealth: Payer: Self-pay | Admitting: *Deleted

## 2014-12-31 NOTE — Telephone Encounter (Signed)
-----   Message from Arnoldo Lenis, MD sent at 12/26/2014 11:03 AM EST ----- Overall echo looks good, heart function is normal. She has one valve that is mildly leaky however this is not something to be concerned about, we will continue to monitor  Zandra Abts MD

## 2014-12-31 NOTE — Telephone Encounter (Signed)
Pt made aware, forwarded to Dr. Eula Fried

## 2015-10-12 ENCOUNTER — Encounter (HOSPITAL_COMMUNITY): Payer: Self-pay | Admitting: Emergency Medicine

## 2015-10-12 ENCOUNTER — Emergency Department (HOSPITAL_COMMUNITY)
Admission: EM | Admit: 2015-10-12 | Discharge: 2015-10-12 | Disposition: A | Discharge status: Home / Self Care | Payer: Medicaid Other | Attending: Emergency Medicine | Admitting: Emergency Medicine

## 2015-10-12 DIAGNOSIS — Z79899 Other long term (current) drug therapy: Secondary | ICD-10-CM | POA: Diagnosis not present

## 2015-10-12 DIAGNOSIS — M1990 Unspecified osteoarthritis, unspecified site: Secondary | ICD-10-CM | POA: Diagnosis not present

## 2015-10-12 DIAGNOSIS — J069 Acute upper respiratory infection, unspecified: Secondary | ICD-10-CM | POA: Insufficient documentation

## 2015-10-12 DIAGNOSIS — E785 Hyperlipidemia, unspecified: Secondary | ICD-10-CM | POA: Insufficient documentation

## 2015-10-12 DIAGNOSIS — Z853 Personal history of malignant neoplasm of breast: Secondary | ICD-10-CM | POA: Insufficient documentation

## 2015-10-12 DIAGNOSIS — Y9389 Activity, other specified: Secondary | ICD-10-CM | POA: Insufficient documentation

## 2015-10-12 DIAGNOSIS — X088XXA Exposure to other specified smoke, fire and flames, initial encounter: Secondary | ICD-10-CM | POA: Insufficient documentation

## 2015-10-12 DIAGNOSIS — Z8673 Personal history of transient ischemic attack (TIA), and cerebral infarction without residual deficits: Secondary | ICD-10-CM | POA: Diagnosis not present

## 2015-10-12 DIAGNOSIS — G629 Polyneuropathy, unspecified: Secondary | ICD-10-CM | POA: Insufficient documentation

## 2015-10-12 DIAGNOSIS — Z7902 Long term (current) use of antithrombotics/antiplatelets: Secondary | ICD-10-CM | POA: Diagnosis not present

## 2015-10-12 DIAGNOSIS — Y998 Other external cause status: Secondary | ICD-10-CM | POA: Insufficient documentation

## 2015-10-12 DIAGNOSIS — I2510 Atherosclerotic heart disease of native coronary artery without angina pectoris: Secondary | ICD-10-CM | POA: Insufficient documentation

## 2015-10-12 DIAGNOSIS — E119 Type 2 diabetes mellitus without complications: Secondary | ICD-10-CM | POA: Insufficient documentation

## 2015-10-12 DIAGNOSIS — T25221A Burn of second degree of right foot, initial encounter: Secondary | ICD-10-CM | POA: Diagnosis not present

## 2015-10-12 DIAGNOSIS — Z7982 Long term (current) use of aspirin: Secondary | ICD-10-CM | POA: Insufficient documentation

## 2015-10-12 DIAGNOSIS — Y9289 Other specified places as the place of occurrence of the external cause: Secondary | ICD-10-CM | POA: Insufficient documentation

## 2015-10-12 DIAGNOSIS — I10 Essential (primary) hypertension: Secondary | ICD-10-CM | POA: Insufficient documentation

## 2015-10-12 DIAGNOSIS — S99921A Unspecified injury of right foot, initial encounter: Secondary | ICD-10-CM | POA: Diagnosis present

## 2015-10-12 DIAGNOSIS — Z951 Presence of aortocoronary bypass graft: Secondary | ICD-10-CM | POA: Diagnosis not present

## 2015-10-12 DIAGNOSIS — F419 Anxiety disorder, unspecified: Secondary | ICD-10-CM | POA: Insufficient documentation

## 2015-10-12 DIAGNOSIS — Z794 Long term (current) use of insulin: Secondary | ICD-10-CM | POA: Insufficient documentation

## 2015-10-12 LAB — CBG MONITORING, ED: GLUCOSE-CAPILLARY: 183 mg/dL — AB (ref 65–99)

## 2015-10-12 MED ORDER — DOXYCYCLINE HYCLATE 100 MG PO TABS
100.0000 mg | ORAL_TABLET | Freq: Once | ORAL | Status: AC
Start: 2015-10-12 — End: 2015-10-12
  Administered 2015-10-12: 100 mg via ORAL
  Filled 2015-10-12: qty 1

## 2015-10-12 MED ORDER — CEPHALEXIN 500 MG PO CAPS
500.0000 mg | ORAL_CAPSULE | Freq: Once | ORAL | Status: AC
  Administered 2015-10-12: 500 mg via ORAL
  Filled 2015-10-12: qty 1

## 2015-10-12 MED ORDER — OXYMETAZOLINE HCL 0.05 % NA SOLN
2.0000 | Freq: Once | NASAL | Status: AC
  Administered 2015-10-12: 2 via NASAL
  Filled 2015-10-12: qty 15

## 2015-10-12 MED ORDER — DOXYCYCLINE HYCLATE 100 MG PO CAPS: 100.0000 mg | ORAL_CAPSULE | Freq: Two times a day (BID) | ORAL | Status: DC

## 2015-10-12 MED ORDER — HYDROCODONE-ACETAMINOPHEN 5-325 MG PO TABS: 1.0000 | ORAL_TABLET | ORAL | Status: DC | PRN

## 2015-10-12 MED ORDER — HYDROCODONE-ACETAMINOPHEN 5-325 MG PO TABS
1.0000 | ORAL_TABLET | Freq: Once | ORAL | Status: AC
  Administered 2015-10-12: 1 via ORAL
  Filled 2015-10-12: qty 1

## 2015-10-12 MED ORDER — SILVER SULFADIAZINE 1 % EX CREA
TOPICAL_CREAM | Freq: Once | CUTANEOUS | Status: AC
  Administered 2015-10-12: 19:00:00 via TOPICAL
  Filled 2015-10-12: qty 50
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
## 2015-10-12 NOTE — Discharge Instructions (Signed)
Please cleanse the wounds gently with soap and water. Apply Silvadene dressing daily. Please have wounds recheck within the next 3 days by your primary physician. Please use doxycycline 2 times daily. Use Tylenol for mild pain, use Norco for more severe pain. You have an upper respiratory infection.  Burn Care Your skin is a natural barrier to infection. It is the largest organ of your body. Burns damage this natural protection. To help prevent infection, it is very important to follow your caregiver's instructions in the care of your burn. Burns are classified as:  First degree. There is only redness of the skin (erythema). No scarring is expected.  Second degree. There is blistering of the skin. Scarring may occur with deeper burns.  Third degree. All layers of the skin are injured, and scarring is expected. HOME CARE INSTRUCTIONS   Wash your hands well before changing your bandage.  Change your bandage as often as directed by your caregiver.  Remove the old bandage. If the bandage sticks, you may soak it off with cool, clean water.  Cleanse the burn thoroughly but gently with mild soap and water.  Pat the area dry with a clean, dry cloth.  Apply a thin layer of antibacterial cream to the burn.  Apply a clean bandage as instructed by your caregiver.  Keep the bandage as clean and dry as possible.  Elevate the affected area for the first 24 hours, then as instructed by your caregiver.  Only take over-the-counter or prescription medicines for pain, discomfort, or fever as directed by your caregiver. SEEK IMMEDIATE MEDICAL CARE IF:   You develop excessive pain.  You develop redness, tenderness, swelling, or red streaks near the burn.  The burned area develops yellowish-white fluid (pus) or a bad smell.  You have a fever. MAKE SURE YOU:   Understand these instructions.  Will watch your condition.  Will get help right away if you are not doing well or get worse.   This  information is not intended to replace advice given to you by your health care provider. Make sure you discuss any questions you have with your health care provider.   Document Released: 11/01/2005 Document Revised: 01/24/2012 Document Reviewed: 03/24/2011 Elsevier Interactive Patient Education 2016 Elsevier Inc.  Upper Respiratory Infection, Adult Most upper respiratory infections (URIs) are a viral infection of the air passages leading to the lungs. A URI affects the nose, throat, and upper air passages. The most common type of URI is nasopharyngitis and is typically referred to as "the common cold." URIs run their course and usually go away on their own. Most of the time, a URI does not require medical attention, but sometimes a bacterial infection in the upper airways can follow a viral infection. This is called a secondary infection. Sinus and middle ear infections are common types of secondary upper respiratory infections. Bacterial pneumonia can also complicate a URI. A URI can worsen asthma and chronic obstructive pulmonary disease (COPD). Sometimes, these complications can require emergency medical care and may be life threatening.  CAUSES Almost all URIs are caused by viruses. A virus is a type of germ and can spread from one person to another.  RISKS FACTORS You may be at risk for a URI if:   You smoke.   You have chronic heart or lung disease.  You have a weakened defense (immune) system.   You are very young or very old.   You have nasal allergies or asthma.  You work in crowded  or poorly ventilated areas.  You work in health care facilities or schools. SIGNS AND SYMPTOMS  Symptoms typically develop 2-3 days after you come in contact with a cold virus. Most viral URIs last 7-10 days. However, viral URIs from the influenza virus (flu virus) can last 14-18 days and are typically more severe. Symptoms may include:   Runny or stuffy (congested) nose.   Sneezing.    Cough.   Sore throat.   Headache.   Fatigue.   Fever.   Loss of appetite.   Pain in your forehead, behind your eyes, and over your cheekbones (sinus pain).  Muscle aches.  DIAGNOSIS  Your health care provider may diagnose a URI by:  Physical exam.  Tests to check that your symptoms are not due to another condition such as:  Strep throat.  Sinusitis.  Pneumonia.  Asthma. TREATMENT  A URI goes away on its own with time. It cannot be cured with medicines, but medicines may be prescribed or recommended to relieve symptoms. Medicines may help:  Reduce your fever.  Reduce your cough.  Relieve nasal congestion. HOME CARE INSTRUCTIONS   Take medicines only as directed by your health care provider.   Gargle warm saltwater or take cough drops to comfort your throat as directed by your health care provider.  Use a warm mist humidifier or inhale steam from a shower to increase air moisture. This may make it easier to breathe.  Drink enough fluid to keep your urine clear or pale yellow.   Eat soups and other clear broths and maintain good nutrition.   Rest as needed.   Return to work when your temperature has returned to normal or as your health care provider advises. You may need to stay home longer to avoid infecting others. You can also use a face mask and careful hand washing to prevent spread of the virus.  Increase the usage of your inhaler if you have asthma.   Do not use any tobacco products, including cigarettes, chewing tobacco, or electronic cigarettes. If you need help quitting, ask your health care provider. PREVENTION  The best way to protect yourself from getting a cold is to practice good hygiene.   Avoid oral or hand contact with people with cold symptoms.   Wash your hands often if contact occurs.  There is no clear evidence that vitamin C, vitamin E, echinacea, or exercise reduces the chance of developing a cold. However, it is  always recommended to get plenty of rest, exercise, and practice good nutrition.  SEEK MEDICAL CARE IF:   You are getting worse rather than better.   Your symptoms are not controlled by medicine.   You have chills.  You have worsening shortness of breath.  You have brown or red mucus.  You have yellow or brown nasal discharge.  You have pain in your face, especially when you bend forward.  You have a fever.  You have swollen neck glands.  You have pain while swallowing.  You have white areas in the back of your throat. SEEK IMMEDIATE MEDICAL CARE IF:   You have severe or persistent:  Headache.  Ear pain.  Sinus pain.  Chest pain.  You have chronic lung disease and any of the following:  Wheezing.  Prolonged cough.  Coughing up blood.  A change in your usual mucus.  You have a stiff neck.  You have changes in your:  Vision.  Hearing.  Thinking.  Mood. MAKE SURE YOU:   Understand  these instructions.  Will watch your condition.  Will get help right away if you are not doing well or get worse.   This information is not intended to replace advice given to you by your health care provider. Make sure you discuss any questions you have with your health care provider.   Document Released: 04/27/2001 Document Revised: 03/18/2015 Document Reviewed: 02/06/2014 Elsevier Interactive Patient Education 2016 Elsevier Inc. Please increase her fluids. Please wash hands frequently. Please use Afrin spray every 8 hours for the next 5 days only.

## 2015-12-31 ENCOUNTER — Other Ambulatory Visit: Payer: Self-pay | Admitting: Cardiology

## 2016-02-17 ENCOUNTER — Ambulatory Visit: Payer: Medicaid Other | Admitting: Adult Health

## 2016-02-23 ENCOUNTER — Encounter: Payer: Self-pay | Admitting: Adult Health

## 2016-02-23 ENCOUNTER — Ambulatory Visit (INDEPENDENT_AMBULATORY_CARE_PROVIDER_SITE_OTHER): Payer: Medicaid Other | Admitting: Adult Health

## 2016-02-23 VITALS — BP 126/56 | HR 77 | Ht 64.0 in | Wt 142.0 lb

## 2016-02-23 DIAGNOSIS — I10 Essential (primary) hypertension: Secondary | ICD-10-CM

## 2016-02-23 DIAGNOSIS — Z01818 Encounter for other preprocedural examination: Secondary | ICD-10-CM | POA: Diagnosis not present

## 2016-02-23 DIAGNOSIS — J0100 Acute maxillary sinusitis, unspecified: Secondary | ICD-10-CM | POA: Diagnosis not present

## 2016-02-23 DIAGNOSIS — I2510 Atherosclerotic heart disease of native coronary artery without angina pectoris: Secondary | ICD-10-CM | POA: Diagnosis not present

## 2016-02-23 MED ORDER — AZITHROMYCIN 250 MG PO TABS: ORAL_TABLET | ORAL | Status: DC

## 2016-02-23 MED ORDER — GUAIFENESIN ER 600 MG PO TB12: 1200.0000 mg | ORAL_TABLET | Freq: Two times a day (BID) | ORAL | Status: DC

## 2016-02-23 NOTE — Progress Notes (Signed)
Name: [name]    DOB: [birth year]  Age: 65 y.o.  MR#: [id number]       PCP:  Cleda Mccreedy, MD      Insurance: Payor: MEDICAID [place] / Plan: MEDICAID [place] ACCESS / Product Type: *No Product type* /   CC:    Chief Complaint  Patient presents with  . Coronary Artery Disease  . Hypertension    VS Filed Vitals:   02/23/16 1352  BP: 126/56  Pulse: 77  Height: _0  (1.626 m)  Weight: 142 lb (64.411 kg)  SpO2: 98%    Weights Current Weight  02/23/16 142 lb (64.411 kg)  10/12/15 146 lb (66.225 kg)  12/20/14 148 lb (67.132 kg)    Blood Pressure  BP Readings from Last 3 Encounters:  02/23/16 126/56  10/12/15 141/67  12/20/14 154/78     Admit date:  (Not on file) Last encounter with RMR:  Visit date not found   Allergy Review of patient's allergies indicates no known allergies.  Current Outpatient Prescriptions  Medication Sig Dispense Refill  . allopurinol (ZYLOPRIM) 100 MG tablet Take by mouth.    Marland Kitchen aspirin 81 MG chewable tablet Chew 81 mg by mouth daily.      Marland Kitchen atorvastatin (LIPITOR) 80 MG tablet TAKE ONE TABLET BY MOUTH ONCE DAILY 90 tablet 0  . Cholecalciferol (VITAMIN D) 2000 UNITS CAPS Take 1 capsule by mouth daily.    . clopidogrel (PLAVIX) 75 MG tablet Take 75 mg by mouth daily.     . folic acid (FOLVITE) 1 MG tablet Take 1 mg by mouth daily.      Marland Kitchen gabapentin (NEURONTIN) 400 MG capsule Take 400 mg by mouth 3 (three) times daily.    Marland Kitchen HYDROcodone-acetaminophen (NORCO/VICODIN) 5-325 MG tablet Take 1 tablet by mouth every 4 (four) hours as needed. 15 tablet 0  . insulin aspart (NOVOLOG) 100 UNIT/ML injection Inject 1-15 Units into the skin 3 (three) times daily. PER SLIDING SCALE-PATIENT SCALE    . insulin glargine (LANTUS) 100 UNIT/ML injection Inject 20 Units into the skin at bedtime.     . metFORMIN (GLUCOPHAGE) 500 MG tablet Take 1,000 mg by mouth 2 (two) times daily with a meal.     . oxyCODONE-acetaminophen (PERCOCET) 10-325 MG tablet Take 1 tablet by  mouth every 6 (six) hours as needed for pain.     No current facility-administered medications for this visit.    Discontinued Meds:    Medications Discontinued During This Encounter  Medication Reason  . alendronate (FOSAMAX) 70 MG tablet Error  . Calcium Carb-Cholecalciferol (CALCIUM + D3) 600-200 MG-UNIT TABS Error  . doxycycline (VIBRAMYCIN) 100 MG capsule Error  . letrozole (FEMARA) 2.5 MG tablet Error    Patient Active Problem List   Diagnosis Date Noted  . IDDM (insulin dependent diabetes mellitus) ([place]) 10/21/2011  . Stroke ([place]) 10/21/2011  . Carotid disease, bilateral ([place]) 10/21/2011  . Closed fracture of right distal radius and ulna 10/01/2011  . BREAST CANCER 12/16/2009  . RBBB 12/16/2009  . HYPERLIPIDEMIA-MIXED 07/31/2009  . Essential hypertension 07/31/2009  . CAD, ARTERY BYPASS GRAFT 07/31/2009    LABS    Component Value Date/Time   NA 139 10/01/2011 1058   NA 139 02/09/2010 1248   NA 137 01/07/2010 0510   K 4.4 10/01/2011 1058   K 4.9 02/09/2010 1248   K 4.0 01/07/2010 0510   CL 106 10/01/2011 1058   CL 111 02/09/2010 1248   CL 105 01/07/2010 0510  CO2 25 10/01/2011 1058   CO2 23 02/09/2010 1248   CO2 28 01/07/2010 0510   GLUCOSE 133* 10/01/2011 1058   GLUCOSE 120* 02/09/2010 1248   GLUCOSE 101* 01/07/2010 0510   BUN 18 10/01/2011 1058   BUN 15 02/09/2010 1248   BUN 10 01/07/2010 0510   CREATININE 0.67 10/01/2011 1058   CREATININE 0.76 02/09/2010 1248   CREATININE 0.64 01/07/2010 0510   CALCIUM 10.9* 10/01/2011 1058   CALCIUM 10.3 02/09/2010 1248   CALCIUM 9.1 01/07/2010 0510   GFRNONAA >90 10/01/2011 1058   GFRNONAA >60 02/09/2010 1248   GFRNONAA >60 01/07/2010 0510   GFRAA >90 10/01/2011 1058   GFRAA  02/09/2010 1248    >60        The eGFR has been calculated using the MDRD equation. This calculation has not been validated in all clinical situations. eGFR's persistently <60 mL/min signify possible Chronic Kidney Disease.   GFRAA   01/07/2010 0510    >60        The eGFR has been calculated using the MDRD equation. This calculation has not been validated in all clinical situations. eGFR's persistently <60 mL/min signify possible Chronic Kidney Disease.   CMP     Component Value Date/Time   NA 139 10/01/2011 1058   K 4.4 10/01/2011 1058   CL 106 10/01/2011 1058   CO2 25 10/01/2011 1058   GLUCOSE 133* 10/01/2011 1058   BUN 18 10/01/2011 1058   CREATININE 0.67 10/01/2011 1058   CALCIUM 10.9* 10/01/2011 1058   PROT 6.5 01/02/2010 1300   ALBUMIN 3.3* 01/02/2010 1300   AST 30 01/02/2010 1300   ALT 27 01/02/2010 1300   ALKPHOS 71 01/02/2010 1300   BILITOT 0.2* 01/02/2010 1300   GFRNONAA >90 10/01/2011 1058   GFRAA >90 10/01/2011 1058       Component Value Date/Time   WBC 4.5 10/01/2011 1058   WBC 6.5 02/09/2010 1248   WBC 6.4 01/07/2010 0510   HGB 12.6 10/01/2011 1058   HGB 11.7* 02/09/2010 1248   HGB 10.5* 01/07/2010 0510   HCT 38.5 10/01/2011 1058   HCT 35.4* 02/09/2010 1248   HCT 31.0* 01/07/2010 0510   MCV 88.9 10/01/2011 1058   MCV 90.5 02/09/2010 1248   MCV 89.6 01/07/2010 0510    Lipid Panel  No results found for: CHOL, TRIG, HDL, CHOLHDL, VLDL, LDLCALC, LDLDIRECT  ABG    Component Value Date/Time   HCO3 20.7 05/11/2007 1614   TCO2 22 05/11/2007 1614   ACIDBASEDEF 3.0* 05/11/2007 1614     No results found for: TSH BNP (last 3 results) No results for input(s): BNP in the last 8760 hours.  ProBNP (last 3 results) No results for input(s): PROBNP in the last 8760 hours.  Cardiac Panel (last 3 results) No results for input(s): CKTOTAL, CKMB, TROPONINI, RELINDX in the last 72 hours.  Iron/TIBC/Ferritin/ %Sat No results found for: IRON, TIBC, FERRITIN, IRONPCTSAT   EKG Orders placed or performed in visit on 02/23/16  . EKG 12-Lead     Prior Assessment and Plan Problem List as of 02/23/2016      Cardiovascular and Mediastinum   Essential hypertension   Last Assessment &  Plan 10/21/2011 Office Visit Written 10/21/2011  1:40 PM by Donney Dice, PA    Well-controlled on current medication regimen      CAD, ARTERY BYPASS GRAFT   Last Assessment & Plan 06/05/2012 Office Visit Written 06/05/2012  2:57 PM by Larey Dresser, MD  No ischemic symptoms.  Continue ASA 81, statin.  I will add ramipril 2.5 mg daily for secondary prevention.  It will also be helpful for renal protection in the setting of insulin-dependent diabetes.  BMET in 1 week.       RBBB   Stroke [facility])   Last Assessment & Plan 06/05/2012 Office Visit Written 06/05/2012  2:58 PM by Larey Dresser, MD    Prior CVAs with some gait instability.  She needs to use a cane given risk of bleeding on Plavix and aspirin.       Carotid disease, bilateral [facility])   Last Assessment & Plan 10/21/2011 Office Visit Written 10/21/2011  1:42 PM by Donney Dice, PA    Followed by Dr. Leonie Man        Endocrine   IDDM (insulin dependent diabetes mellitus) [facility])   Last Assessment & Plan 10/21/2011 Office Visit Written 10/21/2011  1:41 PM by Donney Dice, PA    Followed by Dr. Eula Fried        Musculoskeletal and Integument   Closed fracture of right distal radius and ulna     Other   BREAST CANCER   HYPERLIPIDEMIA-MIXED   Last Assessment & Plan 06/05/2012 Office Visit Written 06/05/2012  2:58 PM by Larey Dresser, MD    Check lipids/LFTs with goal LDL < 70.           Imaging: No results found.

## 2016-02-23 NOTE — Patient Instructions (Signed)
Medication Instructions:  Start Z- pack ( take 2 tablets the first day & 1 tablet daily for the next 4 days)  Start Mucinex 1200 mg two times daily   Labwork: none  Testing/Procedures: Your physician has requested that you have an exercise tolerance test. For further information please visit [email or web address]. Please also follow instruction sheet, as given.    Follow-Up: Your physician wants you to follow-up in: 3 months in [place].  You will receive a reminder letter in the mail two months in advance. If you don't receive a letter, please call our office to schedule the follow-up appointment.   Any Other Special Instructions Will Be Listed Below (If Applicable).     If you need a refill on your cardiac medications before your next appointment, please call your pharmacy.

## 2016-02-23 NOTE — Progress Notes (Signed)
Cardiology Office Note   Date:  02/23/2016   ID:  KRISALYN MARCHETTA, DOB Mar 04, 1951, MRN [id number]  PCP:  Cleda Mccreedy, MD  Cardiologist: Cloria Spring, NP   Chief Complaint  Patient presents with  . Coronary Artery Disease  . Hypertension      History of Present Illness: [name] is a 65 y.o. female who presents for ongoing assessment and management of coronary artery disease, with history of CABG in 2007, hypertension, hyperlipidemia, with history of a CVA on aspirin and Plavix.the patient was last seen by Dr. Harl Bowie in [place] of 2016, at that time.  She was stable from a cardiac standpoint.  She did have mildly elevated blood pressure, but no medication changes were made, and she was to keep a blood pressure log.  She was supposed to followup in approximately 2 weeks.  Seen in over a year.  She comes today for a preoperative cardiac evaluation.  She is due to have ventral hernia repair.  She has not had any cardiac testing concerning ischemia.  Since prior to coronary artery bypass grafting.  She had an echocardiogram done in 2016,which revealed normal.  Systolic function with EF of 50-55% with mild concentric hypertrophy.  Doppler parameters were consistent with abnormal left ventricular relaxation (grade 1 diastolic dysfunction.  She had mild mitral valve regurg,  She comes today without cardiac complaints.  She has been having some sinusitis and signs of infection with greenish drainage and congestion.  This is also causing pressure in her ears.  She denies any chest pain or dyspnea.  She has not been hospitalized for cardiac issues since being seen last.   Past Medical History  Diagnosis Date  . Diabetes mellitus   . Hypertension   . Cancer [facility])     Left mastectomy, 2/11  . Neuropathic pain, leg   . Coronary artery disease     cabg 2007  . Neuromuscular disorder ([place])   . Anxiety   . Arthritis   . HLD (hyperlipidemia)   . Stroke, lacunar ([place])      Followed by Dr. Leonie Man  . Carotid disease, bilateral ([place])     Followed by Dr. Leonie Man    Past Surgical History  Procedure Laterality Date  . Coronary artery bypass graft    . Fracture surgery    . Mastectomy  2010    lt-nodes  . Orif ulnar fracture  10/01/2011    Procedure: OPEN REDUCTION INTERNAL FIXATION (ORIF) ULNAR FRACTURE;  Surgeon: Kerin Salen;  Location: [place];  Service: Orthopedics;  Laterality: Right;  and orif scaphoid     Current Outpatient Prescriptions  Medication Sig Dispense Refill  . allopurinol (ZYLOPRIM) 100 MG tablet Take by mouth.    Marland Kitchen aspirin 81 MG chewable tablet Chew 81 mg by mouth daily.      Marland Kitchen atorvastatin (LIPITOR) 80 MG tablet TAKE ONE TABLET BY MOUTH ONCE DAILY 90 tablet 0  . Cholecalciferol (VITAMIN D) 2000 UNITS CAPS Take 1 capsule by mouth daily.    . clopidogrel (PLAVIX) 75 MG tablet Take 75 mg by mouth daily.     . folic acid (FOLVITE) 1 MG tablet Take 1 mg by mouth daily.      Marland Kitchen gabapentin (NEURONTIN) 400 MG capsule Take 400 mg by mouth 3 (three) times daily.    Marland Kitchen HYDROcodone-acetaminophen (NORCO/VICODIN) 5-325 MG tablet Take 1 tablet by mouth every 4 (four) hours as needed. 15 tablet 0  . insulin aspart (NOVOLOG) 100  UNIT/ML injection Inject 1-15 Units into the skin 3 (three) times daily. PER SLIDING SCALE-PATIENT SCALE    . insulin glargine (LANTUS) 100 UNIT/ML injection Inject 20 Units into the skin at bedtime.     . metFORMIN (GLUCOPHAGE) 500 MG tablet Take 1,000 mg by mouth 2 (two) times daily with a meal.     . oxyCODONE-acetaminophen (PERCOCET) 10-325 MG tablet Take 1 tablet by mouth every 6 (six) hours as needed for pain.     No current facility-administered medications for this visit.    Allergies:   Review of patient's allergies indicates no known allergies.    Social History:  The patient  reports that she has never smoked. She has never used smokeless tobacco. She reports that she does not drink alcohol or use illicit drugs.    Family History:  The patient's family history is not on file.    ROS: All other systems are reviewed and negative. Unless otherwise mentioned in H&P    PHYSICAL EXAM: VS:  BP 126/56 mmHg  Pulse 77  Ht 5\' 4"  (1.626 m)  Wt 142 lb (64.411 kg)  BMI 24.36 kg/m2  SpO2 98% , BMI Body mass index is 24.36 kg/(m^2). GEN: Well nourished, well developed, in no acute distress HEENT: normal Neck: no JVD, carotid bruits, or masses Cardiac: 0000000 systolic murmur,  rubs, or gallops,no edema  Respiratory:  clear to auscultation bilaterally, normal work of breathing GI: soft, nontender, nondistended, + BS MS: no deformity or atrophy Skin: warm and dry, no rash Neuro:  Strength and sensation are intact Psych: euthymic mood, full affect   EKG:  Normal sinus rhythm, sinus bradycardia, rate 57 beats per minute, with right bundle branch block, and pulmonary disease pattern.  Recent Labs: No results found for requested labs within last 365 days.    Lipid Panel No results found for: CHOL, TRIG, HDL, CHOLHDL, VLDL, LDLCALC, LDLDIRECT    Wt Readings from Last 3 Encounters:  02/23/16 142 lb (64.411 kg)  10/12/15 146 lb (66.225 kg)  12/20/14 148 lb (67.132 kg)     ASSESSMENT AND PLAN:  1. Coronary artery disease: Status post coronary artery bypass grafting in 2007.  She has not had any ischemic testing since prior to the surgery.  I will have a Cardiolite GXT completed for evaluation of cardiac status before undergoing surgery.  We will not repeat echocardiogram.  If stress test is negative, will allow her to proceed with surgery with a low cardiac risk event.  We will plan on having a stress test done this week.  There are no medications on reviewed that she needs to hold.  Primary care physician, and surgeon will plan for a labs.  She will likely need to hold Plavix and aspirin for 3 days prior to surgery, but started as soon as possible and surgeon is comfortable with restarting.  2.  Hypertension: Blood pressure is well-controlled.  She is not on any antihypertensive medications at this time.with history of diabetes.  She may benefit from low-dose ACE inhibitor, but we will not start at this time.  She will followup with Dr. Harl Bowie in the [place] office, post surgery.  3. Sinusitis:Z-Pak and Mucinex 1200 mg twice a day as prescribed.  Any further refills, assessment mpleted by primary care physician.  Current medicines are reviewed at length with the patient today.    Labs/ tests ordered today include:   Orders Placed This Encounter  Procedures  . EKG 12-Lead     Disposition:  FU with Dr. Harl Bowie in [place] office in 3 months. If stress test is negative for ischemia t release for surgery will be sent to Dr. Donne Hazel at [facility] surgery, fax number [phone], office.  Number [phone].   Signed, Jory Sims, NP  02/23/2016 2:15 PM    [facility] [address], [place], [place] [address] Phone: [phone]; Fax: [phone]

## 2016-02-25 ENCOUNTER — Other Ambulatory Visit: Payer: Self-pay | Admitting: *Deleted

## 2016-02-25 DIAGNOSIS — Z01818 Encounter for other preprocedural examination: Secondary | ICD-10-CM

## 2016-02-26 ENCOUNTER — Encounter (HOSPITAL_COMMUNITY): Payer: Self-pay

## 2016-02-26 ENCOUNTER — Encounter (HOSPITAL_COMMUNITY)
Admission: RE | Admit: 2016-02-26 | Discharge: 2016-02-26 | Disposition: A | Discharge status: Home / Self Care | Payer: Medicaid Other | Source: Ambulatory Visit | Attending: Adult Health | Admitting: Adult Health

## 2016-02-26 ENCOUNTER — Ambulatory Visit (HOSPITAL_COMMUNITY): Admission: RE | Admit: 2016-02-26 | Payer: Medicaid Other | Source: Ambulatory Visit

## 2016-02-26 DIAGNOSIS — R931 Abnormal findings on diagnostic imaging of heart and coronary circulation: Secondary | ICD-10-CM | POA: Insufficient documentation

## 2016-02-26 DIAGNOSIS — Z01818 Encounter for other preprocedural examination: Secondary | ICD-10-CM | POA: Diagnosis not present

## 2016-02-26 MED ORDER — SODIUM CHLORIDE 0.9% FLUSH
INTRAVENOUS | Status: AC
  Administered 2016-02-26: 10 mL via INTRAVENOUS
  Filled 2016-02-26: qty 10

## 2016-02-26 MED ORDER — TECHNETIUM TC 99M SESTAMIBI - CARDIOLITE
30.0000 | Freq: Once | INTRAVENOUS | Status: AC | PRN
  Administered 2016-02-26: 09:00:00 30 via INTRAVENOUS

## 2016-02-26 MED ORDER — REGADENOSON 0.4 MG/5ML IV SOLN
INTRAVENOUS | Status: AC
  Administered 2016-02-26: 0.4 mg via INTRAVENOUS
  Filled 2016-02-26: qty 5

## 2016-02-26 MED ORDER — TECHNETIUM TC 99M SESTAMIBI GENERIC - CARDIOLITE
10.0000 | Freq: Once | INTRAVENOUS | Status: AC | PRN
  Administered 2016-02-26: 10 via INTRAVENOUS

## 2016-02-27 LAB — NM MYOCAR MULTI W/SPECT W/WALL MOTION / EF
CHL CUP NUCLEAR SSS: 3
CHL CUP RESTING HR STRESS: 61 {beats}/min
CSEPPHR: 89 {beats}/min
LHR: 0.21
LVDIAVOL: 86 mL (ref 46–106)
LVSYSVOL: 28 mL
NUC STRESS TID: 0.95
SDS: 0
SRS: 3

## 2016-03-04 ENCOUNTER — Other Ambulatory Visit: Payer: Self-pay | Admitting: General Surgery

## 2016-03-26 ENCOUNTER — Encounter (HOSPITAL_COMMUNITY)
Admission: RE | Admit: 2016-03-26 | Discharge: 2016-03-26 | Disposition: A | Discharge status: Home / Self Care | Payer: Medicaid Other | Source: Ambulatory Visit | Attending: General Surgery | Admitting: General Surgery

## 2016-03-26 ENCOUNTER — Encounter (HOSPITAL_COMMUNITY): Payer: Self-pay

## 2016-03-26 DIAGNOSIS — Z79899 Other long term (current) drug therapy: Secondary | ICD-10-CM | POA: Insufficient documentation

## 2016-03-26 DIAGNOSIS — Z8673 Personal history of transient ischemic attack (TIA), and cerebral infarction without residual deficits: Secondary | ICD-10-CM | POA: Diagnosis not present

## 2016-03-26 DIAGNOSIS — Z853 Personal history of malignant neoplasm of breast: Secondary | ICD-10-CM | POA: Insufficient documentation

## 2016-03-26 DIAGNOSIS — I10 Essential (primary) hypertension: Secondary | ICD-10-CM | POA: Diagnosis not present

## 2016-03-26 DIAGNOSIS — E119 Type 2 diabetes mellitus without complications: Secondary | ICD-10-CM | POA: Diagnosis not present

## 2016-03-26 DIAGNOSIS — I2510 Atherosclerotic heart disease of native coronary artery without angina pectoris: Secondary | ICD-10-CM | POA: Insufficient documentation

## 2016-03-26 DIAGNOSIS — E785 Hyperlipidemia, unspecified: Secondary | ICD-10-CM | POA: Diagnosis not present

## 2016-03-26 DIAGNOSIS — K429 Umbilical hernia without obstruction or gangrene: Secondary | ICD-10-CM | POA: Diagnosis not present

## 2016-03-26 DIAGNOSIS — Z794 Long term (current) use of insulin: Secondary | ICD-10-CM | POA: Insufficient documentation

## 2016-03-26 DIAGNOSIS — Z951 Presence of aortocoronary bypass graft: Secondary | ICD-10-CM | POA: Diagnosis not present

## 2016-03-26 DIAGNOSIS — Z7902 Long term (current) use of antithrombotics/antiplatelets: Secondary | ICD-10-CM | POA: Insufficient documentation

## 2016-03-26 DIAGNOSIS — Z01812 Encounter for preprocedural laboratory examination: Secondary | ICD-10-CM | POA: Diagnosis not present

## 2016-03-26 DIAGNOSIS — Z01818 Encounter for other preprocedural examination: Secondary | ICD-10-CM | POA: Insufficient documentation

## 2016-03-26 DIAGNOSIS — Z7982 Long term (current) use of aspirin: Secondary | ICD-10-CM | POA: Insufficient documentation

## 2016-03-26 HISTORY — DX: Cerebral infarction, unspecified: I63.9

## 2016-03-26 LAB — CBC WITH DIFFERENTIAL/PLATELET
BASOS PCT: 1 %
Basophils Absolute: 0 10*3/uL (ref 0.0–0.1)
EOS PCT: 2 %
Eosinophils Absolute: 0.1 10*3/uL (ref 0.0–0.7)
HEMATOCRIT: 34.7 % — AB (ref 36.0–46.0)
Hemoglobin: 11.1 g/dL — ABNORMAL LOW (ref 12.0–15.0)
Lymphocytes Relative: 23 %
Lymphs Abs: 1.3 10*3/uL (ref 0.7–4.0)
MCH: 28.4 pg (ref 26.0–34.0)
MCHC: 32 g/dL (ref 30.0–36.0)
MCV: 88.7 fL (ref 78.0–100.0)
MONO ABS: 0.3 10*3/uL (ref 0.1–1.0)
MONOS PCT: 5 %
NEUTROS ABS: 3.9 10*3/uL (ref 1.7–7.7)
Neutrophils Relative %: 69 %
Platelets: 198 10*3/uL (ref 150–400)
RBC: 3.91 MIL/uL (ref 3.87–5.11)
RDW: 15 % (ref 11.5–15.5)
WBC: 5.6 10*3/uL (ref 4.0–10.5)

## 2016-03-26 LAB — BASIC METABOLIC PANEL
ANION GAP: 12 (ref 5–15)
BUN: 21 mg/dL — AB (ref 6–20)
CALCIUM: 10.1 mg/dL (ref 8.9–10.3)
CO2: 21 mmol/L — ABNORMAL LOW (ref 22–32)
CREATININE: 1.02 mg/dL — AB (ref 0.44–1.00)
Chloride: 107 mmol/L (ref 101–111)
GFR calc non Af Amer: 57 mL/min — ABNORMAL LOW (ref >60)
Glucose, Bld: 244 mg/dL — ABNORMAL HIGH (ref 65–99)
POTASSIUM: 4.5 mmol/L (ref 3.5–5.1)
SODIUM: 140 mmol/L (ref 135–145)

## 2016-03-26 LAB — GLUCOSE, CAPILLARY: GLUCOSE-CAPILLARY: 184 mg/dL — AB (ref 65–99)

## 2016-03-26 NOTE — Pre-Procedure Instructions (Addendum)
[name]  03/26/2016      [facility] - [place], [place] - [address] [place]  [address] Phone: [phone] Fax: [phone]    Your procedure is scheduled on 04/05/16  Report to [facility] Admitting at 530 A.M.  Call this number if you have problems the morning of surgery:  [phone]   Remember:  Do not eat food or drink liquids after midnight.  Take these medicines the morning of surgery with A SIP OF WATER allopurinol, gabapentin, oxycodone if needed  STOP all herbel meds, nsaids (aleve,naproxen,advil,ibuprofen) 5 days prior to surgery(03/31/16) including, vitamins                            Plavix ,aspirin hold 3 days Stop 5.17/17   No metformin morning of surgery    How to Manage Your Diabetes Before and After Surgery  Why is it important to control my blood sugar before and after surgery? . Improving blood sugar levels before and after surgery helps healing and can limit problems. . A way of improving blood sugar control is eating a healthy diet by: o  Eating less sugar and carbohydrates o  Increasing activity/exercise o  Talking with your doctor about reaching your blood sugar goals . High blood sugars (greater than 180 mg/dL) can raise your risk of infections and slow your recovery, so you will need to focus on controlling your diabetes during the weeks before surgery. . Make sure that the doctor who takes care of your diabetes knows about your planned surgery including the date and location.  How do I manage my blood sugar before surgery? . Check your blood sugar at least 4 times a day, starting 2 days before surgery, to make sure that the level is not too high or low. o Check your blood sugar the morning of your surgery when you wake up and every 2 hours until you get to the Short Stay unit. . If your blood sugar is less than 70 mg/dL, you will need to treat for low blood sugar: o Do not take  insulin. o Treat a low blood sugar (less than 70 mg/dL) with  cup of clear juice (cranberry or apple), 4 glucose tablets, OR glucose gel. o Recheck blood sugar in 15 minutes after treatment (to make sure it is greater than 70 mg/dL). If your blood sugar is not greater than 70 mg/dL on recheck, call [phone] for further instructions. . Report your blood sugar to the short stay nurse when you get to Short Stay.  . If you are admitted to the hospital after surgery: o Your blood sugar will be checked by the staff and you will probably be given insulin after surgery (instead of oral diabetes medicines) to make sure you have good blood sugar levels. o The goal for blood sugar control after surgery is 80-180 mg/dL.    WHAT DO I DO ABOUT MY DIABETES MEDICATION?   Marland Kitchen Do not take oral diabetes medicines (pills) the morning of surgery.(metformin)  . THE NIGHT BEFORE SURGERY,May take mealtime dose of novolog if needed but Do not take Bedtime dose of novolog  ,Take 10 units of lantus night before         .  . no novolog morning of surgery except as  Instructed below .  If your CBG is greater than 220 mg/dL, you may take  of your sliding  scale (correction) dose of insulin.  .   Reviewed and Endorsed by [employer] Patient Education Committee, August 2015    Do not wear jewelry, make-up or nail polish.  Do not wear lotions, powders, or perfumes.  You may wear deodorant.  Do not shave 48 hours prior to surgery.  Men may shave face and neck.  Do not bring valuables to the hospital.  [facility] is not responsible for any belongings or valuables.  Contacts, dentures or bridgework may not be worn into surgery.  Leave your suitcase in the car.  After surgery it may be brought to your room.  For patients admitted to the hospital, discharge time will be determined by your treatment team.  Patients discharged the day of surgery will not be allowed to drive home.   Name and phone number of your  driver:   Special instructions:   Special Instructions: [place] - Preparing for Surgery  Before surgery, you can play an important role.  Because skin is not sterile, your skin needs to be as free of germs as possible.  You can reduce the number of germs on you skin by washing with CHG (chlorahexidine gluconate) soap before surgery.  CHG is an antiseptic cleaner which kills germs and bonds with the skin to continue killing germs even after washing.  Please DO NOT use if you have an allergy to CHG or antibacterial soaps.  If your skin becomes reddened/irritated stop using the CHG and inform your nurse when you arrive at Short Stay.  Do not shave (including legs and underarms) for at least 48 hours prior to the first CHG shower.  You may shave your face.  Please follow these instructions carefully:   1.  Shower with CHG Soap the night before surgery and the morning of Surgery.  2.  If you choose to wash your hair, wash your hair first as usual with your normal shampoo.  3.  After you shampoo, rinse your hair and body thoroughly to remove the Shampoo.  4.  Use CHG as you would any other liquid soap.  You can apply chg directly  to the skin and wash gently with scrungie or a clean washcloth.  5.  Apply the CHG Soap to your body ONLY FROM THE NECK DOWN.  Do not use on open wounds or open sores.  Avoid contact with your eyes ears, mouth and genitals (private parts).  Wash genitals (private parts)       with your normal soap.  6.  Wash thoroughly, paying special attention to the area where your surgery will be performed.  7.  Thoroughly rinse your body with warm water from the neck down.  8.  DO NOT shower/wash with your normal soap after using and rinsing off the CHG Soap.  9.  Pat yourself dry with a clean towel.            10.  Wear clean pajamas.            11.  Place clean sheets on your bed the night of your first shower and do not sleep with pets.  Day of Surgery  Do not apply any  lotions/deodorants the morning of surgery.  Please wear clean clothes to the hospital/surgery center.  Please read over the following fact sheets that you were given. Pain Booklet, Coughing and Deep Breathing and Surgical Site Infection Prevention

## 2016-03-27 LAB — HEMOGLOBIN A1C
HEMOGLOBIN A1C: 7.8 % — AB (ref 4.8–5.6)
Mean Plasma Glucose: 177 mg/dL

## 2016-03-29 NOTE — Progress Notes (Signed)
Anesthesia Chart Review:  Pt is a 65 year old female scheduled for open umbilical hernia repair with mesh on 04/05/2016 with Dr. Donne Hazel.   PMH includes:  CAD (s/p CABG 2007), HTN, DM, hyperlipidemia, stroke, carotid disease, breast cancerS/p ORIF R ulnar fx 10/01/11.   Medications include: ASA, lipitor, plavix, denosumab, lasix, novolog, lantus, metformin. Pt to stop plavix, ASA 5 days before surgery.   Preoperative labs reviewed.  HgbA1c 7.8, glucose 244.   EKG 02/23/16: Sinus  Bradycardia (57 bpm). RBBB and right axis -possible right ventricular hypertrophy  -consider pulmonary disease.   Nuclear stress test 02/26/16:   There was no ST segment deviation noted during stress.  Findings consistent with prior small inferoseptal myocardial infarction with no current peri-infarct ischemia.  This is a low risk study. There is no myocardium currently at jeopardy.  The left ventricular ejection fraction is hyperdynamic (>65%).  Echo 12/25/14:  - Left ventricle: The cavity size was normal. Systolic function was normal. The estimated ejection fraction was in the range of 50% to 55%. Very mild concentric hypertrophy. Posterior wall measurement is overestimated. Wall motion was normal; there were no regional wall motion abnormalities. Doppler parameters are consistent with abnormal left ventricular relaxation (grade 1 diastolic dysfunction). Doppler parameters are consistent with high ventricular filling pressure. - Mitral valve: There was mild regurgitation. - Left atrium: The atrium was mildly dilated. Volume/bsa, S: 30.5 ml/m^2. - Right ventricle: Systolic function was mildly reduced. - Tricuspid valve: There was mild regurgitation.  Cardiologist is Dr. Carlyle Dolly, last office visit 02/23/16 with Jory Sims, NP for pre-op eval. Stress test ordered, results above indicate it is low risk.   If no changes, I anticipate pt can proceed with surgery as scheduled.   Willeen Cass,  FNP-BC [facility] Short Stay Surgical Center/Anesthesiology Phone: [phone] 03/29/2016 3:19 PM

## 2016-04-01 ENCOUNTER — Other Ambulatory Visit: Payer: Self-pay | Admitting: Cardiology

## 2016-04-04 MED ORDER — CEFAZOLIN SODIUM-DEXTROSE 2-4 GM/100ML-% IV SOLN
2.0000 g | INTRAVENOUS | Status: AC
  Administered 2016-04-05: 2 g via INTRAVENOUS
  Filled 2016-04-04: qty 100

## 2016-04-05 ENCOUNTER — Ambulatory Visit (HOSPITAL_COMMUNITY): Payer: Medicaid Other | Admitting: Emergency Medicine

## 2016-04-05 ENCOUNTER — Encounter (HOSPITAL_COMMUNITY): Payer: Self-pay | Admitting: Surgery

## 2016-04-05 ENCOUNTER — Ambulatory Visit (HOSPITAL_COMMUNITY): Payer: Medicaid Other | Admitting: Anesthesiology

## 2016-04-05 ENCOUNTER — Observation Stay (HOSPITAL_COMMUNITY)
Admission: RE | Admit: 2016-04-05 | Discharge: 2016-04-06 | Disposition: A | Discharge status: Home / Self Care | Payer: Medicaid Other | Source: Ambulatory Visit | Attending: General Surgery | Admitting: General Surgery

## 2016-04-05 ENCOUNTER — Encounter (HOSPITAL_COMMUNITY)
Admission: RE | Disposition: A | Discharge status: Home / Self Care | Payer: Self-pay | Source: Ambulatory Visit | Attending: General Surgery

## 2016-04-05 DIAGNOSIS — Z794 Long term (current) use of insulin: Secondary | ICD-10-CM | POA: Insufficient documentation

## 2016-04-05 DIAGNOSIS — I252 Old myocardial infarction: Secondary | ICD-10-CM | POA: Diagnosis not present

## 2016-04-05 DIAGNOSIS — K420 Umbilical hernia with obstruction, without gangrene: Principal | ICD-10-CM | POA: Insufficient documentation

## 2016-04-05 DIAGNOSIS — Z9012 Acquired absence of left breast and nipple: Secondary | ICD-10-CM | POA: Insufficient documentation

## 2016-04-05 DIAGNOSIS — Z79899 Other long term (current) drug therapy: Secondary | ICD-10-CM | POA: Diagnosis not present

## 2016-04-05 DIAGNOSIS — E119 Type 2 diabetes mellitus without complications: Secondary | ICD-10-CM | POA: Insufficient documentation

## 2016-04-05 DIAGNOSIS — Z923 Personal history of irradiation: Secondary | ICD-10-CM | POA: Insufficient documentation

## 2016-04-05 DIAGNOSIS — Z9221 Personal history of antineoplastic chemotherapy: Secondary | ICD-10-CM | POA: Insufficient documentation

## 2016-04-05 DIAGNOSIS — Z853 Personal history of malignant neoplasm of breast: Secondary | ICD-10-CM | POA: Diagnosis not present

## 2016-04-05 DIAGNOSIS — I2510 Atherosclerotic heart disease of native coronary artery without angina pectoris: Secondary | ICD-10-CM | POA: Diagnosis not present

## 2016-04-05 DIAGNOSIS — Z951 Presence of aortocoronary bypass graft: Secondary | ICD-10-CM | POA: Diagnosis not present

## 2016-04-05 DIAGNOSIS — K429 Umbilical hernia without obstruction or gangrene: Secondary | ICD-10-CM | POA: Diagnosis present

## 2016-04-05 DIAGNOSIS — I10 Essential (primary) hypertension: Secondary | ICD-10-CM | POA: Insufficient documentation

## 2016-04-05 DIAGNOSIS — Z7901 Long term (current) use of anticoagulants: Secondary | ICD-10-CM | POA: Diagnosis not present

## 2016-04-05 DIAGNOSIS — Z8673 Personal history of transient ischemic attack (TIA), and cerebral infarction without residual deficits: Secondary | ICD-10-CM | POA: Insufficient documentation

## 2016-04-05 DIAGNOSIS — K432 Incisional hernia without obstruction or gangrene: Secondary | ICD-10-CM | POA: Diagnosis present

## 2016-04-05 HISTORY — PX: INSERTION OF MESH: SHX5868

## 2016-04-05 HISTORY — PX: UMBILICAL HERNIA REPAIR: SHX196

## 2016-04-05 LAB — URINALYSIS, ROUTINE W REFLEX MICROSCOPIC
BILIRUBIN URINE: NEGATIVE
Glucose, UA: NEGATIVE mg/dL
Hgb urine dipstick: NEGATIVE
Ketones, ur: NEGATIVE mg/dL
NITRITE: NEGATIVE
PH: 5.5 (ref 5.0–8.0)
Protein, ur: NEGATIVE mg/dL
SPECIFIC GRAVITY, URINE: 1.019 (ref 1.005–1.030)

## 2016-04-05 LAB — GLUCOSE, CAPILLARY
Glucose-Capillary: 127 mg/dL — ABNORMAL HIGH (ref 65–99)
Glucose-Capillary: 128 mg/dL — ABNORMAL HIGH (ref 65–99)
Glucose-Capillary: 105 mg/dL — ABNORMAL HIGH (ref 65–99)
Glucose-Capillary: 117 mg/dL — ABNORMAL HIGH (ref 65–99)

## 2016-04-05 LAB — URINE MICROSCOPIC-ADD ON

## 2016-04-05 SURGERY — REPAIR, HERNIA, UMBILICAL, ADULT
Anesthesia: General | Site: Abdomen

## 2016-04-05 MED ORDER — ACETAMINOPHEN 650 MG RE SUPP: 650.0000 mg | Freq: Four times a day (QID) | RECTAL | Status: DC | PRN

## 2016-04-05 MED ORDER — ONDANSETRON HCL 4 MG/2ML IJ SOLN: 4.0000 mg | Freq: Four times a day (QID) | INTRAMUSCULAR | Status: DC | PRN

## 2016-04-05 MED ORDER — ASPIRIN 81 MG PO CHEW
81.0000 mg | CHEWABLE_TABLET | Freq: Every day | ORAL | Status: DC
  Administered 2016-04-06: 81 mg via ORAL
  Filled 2016-04-05: qty 1

## 2016-04-05 MED ORDER — FENTANYL CITRATE (PF) 250 MCG/5ML IJ SOLN
INTRAMUSCULAR | Status: AC
  Filled 2016-04-05: qty 5

## 2016-04-05 MED ORDER — MIDAZOLAM HCL 2 MG/2ML IJ SOLN
INTRAMUSCULAR | Status: AC
  Filled 2016-04-05: qty 2

## 2016-04-05 MED ORDER — CLOPIDOGREL BISULFATE 75 MG PO TABS
75.0000 mg | ORAL_TABLET | Freq: Every day | ORAL | Status: DC
  Administered 2016-04-06: 75 mg via ORAL
  Filled 2016-04-05: qty 1

## 2016-04-05 MED ORDER — 0.9 % SODIUM CHLORIDE (POUR BTL) OPTIME
TOPICAL | Status: DC | PRN
Start: 2016-04-05 — End: 2016-04-05
  Administered 2016-04-05: 1000 mL

## 2016-04-05 MED ORDER — INSULIN ASPART 100 UNIT/ML ~~LOC~~ SOLN: 0.0000 [IU] | Freq: Three times a day (TID) | SUBCUTANEOUS | Status: DC

## 2016-04-05 MED ORDER — LACTATED RINGERS IV SOLN
INTRAVENOUS | Status: DC
  Administered 2016-04-05: 11:00:00 via INTRAVENOUS

## 2016-04-05 MED ORDER — FUROSEMIDE 20 MG PO TABS
20.0000 mg | ORAL_TABLET | Freq: Every day | ORAL | Status: DC
  Administered 2016-04-06: 20 mg via ORAL
  Filled 2016-04-05: qty 1

## 2016-04-05 MED ORDER — ROCURONIUM BROMIDE 100 MG/10ML IV SOLN
INTRAVENOUS | Status: DC | PRN
  Administered 2016-04-05: 30 mg via INTRAVENOUS

## 2016-04-05 MED ORDER — EPHEDRINE SULFATE-NACL 50-0.9 MG/10ML-% IV SOSY
PREFILLED_SYRINGE | INTRAVENOUS | Status: DC | PRN
  Administered 2016-04-05: 5 mg via INTRAVENOUS

## 2016-04-05 MED ORDER — FENTANYL CITRATE (PF) 100 MCG/2ML IJ SOLN
INTRAMUSCULAR | Status: DC | PRN
  Administered 2016-04-05: 125 ug via INTRAVENOUS

## 2016-04-05 MED ORDER — HYDROMORPHONE HCL 1 MG/ML IJ SOLN
0.2500 mg | INTRAMUSCULAR | Status: DC | PRN
  Administered 2016-04-05 (×2): 0.5 mg via INTRAVENOUS

## 2016-04-05 MED ORDER — PHENYLEPHRINE HCL 10 MG/ML IJ SOLN
10.0000 mg | INTRAMUSCULAR | Status: DC | PRN
  Administered 2016-04-05: 50 ug/min via INTRAVENOUS

## 2016-04-05 MED ORDER — GLYCOPYRROLATE 0.2 MG/ML IV SOSY
PREFILLED_SYRINGE | INTRAVENOUS | Status: AC
  Filled 2016-04-05: qty 3

## 2016-04-05 MED ORDER — MEPERIDINE HCL 25 MG/ML IJ SOLN: 6.2500 mg | INTRAMUSCULAR | Status: DC | PRN

## 2016-04-05 MED ORDER — OXYCODONE HCL 5 MG PO TABS
5.0000 mg | ORAL_TABLET | Freq: Four times a day (QID) | ORAL | Status: DC | PRN
  Administered 2016-04-05: 5 mg via ORAL
  Filled 2016-04-05 (×2): qty 1

## 2016-04-05 MED ORDER — GABAPENTIN 400 MG PO CAPS
400.0000 mg | ORAL_CAPSULE | Freq: Three times a day (TID) | ORAL | Status: DC
  Administered 2016-04-05 – 2016-04-06 (×3): 400 mg via ORAL
  Filled 2016-04-05 (×3): qty 1

## 2016-04-05 MED ORDER — METHOCARBAMOL 500 MG PO TABS
500.0000 mg | ORAL_TABLET | Freq: Four times a day (QID) | ORAL | Status: DC | PRN
  Administered 2016-04-05: 500 mg via ORAL
  Filled 2016-04-05 (×2): qty 1

## 2016-04-05 MED ORDER — OXYCODONE-ACETAMINOPHEN 5-325 MG PO TABS
1.0000 | ORAL_TABLET | Freq: Four times a day (QID) | ORAL | Status: DC | PRN
  Administered 2016-04-05: 1 via ORAL
  Filled 2016-04-05 (×2): qty 1

## 2016-04-05 MED ORDER — LIDOCAINE HCL (CARDIAC) 20 MG/ML IV SOLN
INTRAVENOUS | Status: DC | PRN
  Administered 2016-04-05: 100 mg via INTRAVENOUS

## 2016-04-05 MED ORDER — EPHEDRINE 5 MG/ML INJ
INTRAVENOUS | Status: AC
  Filled 2016-04-05: qty 10

## 2016-04-05 MED ORDER — INSULIN GLARGINE 100 UNIT/ML ~~LOC~~ SOLN
20.0000 [IU] | Freq: Every day | SUBCUTANEOUS | Status: DC
  Filled 2016-04-05 (×3): qty 0.2

## 2016-04-05 MED ORDER — SODIUM CHLORIDE 0.9 % IV SOLN
INTRAVENOUS | Status: DC
  Administered 2016-04-05: 16:00:00 via INTRAVENOUS

## 2016-04-05 MED ORDER — NEOSTIGMINE METHYLSULFATE 10 MG/10ML IV SOLN
INTRAVENOUS | Status: DC | PRN
  Administered 2016-04-05: 3 mg via INTRAVENOUS

## 2016-04-05 MED ORDER — PROPOFOL 10 MG/ML IV BOLUS
INTRAVENOUS | Status: AC
  Filled 2016-04-05: qty 20

## 2016-04-05 MED ORDER — ACETAMINOPHEN 325 MG PO TABS: 650.0000 mg | ORAL_TABLET | Freq: Four times a day (QID) | ORAL | Status: DC | PRN

## 2016-04-05 MED ORDER — EPHEDRINE SULFATE 50 MG/ML IJ SOLN: INTRAMUSCULAR | Status: DC | PRN

## 2016-04-05 MED ORDER — HYDROMORPHONE HCL 1 MG/ML IJ SOLN
INTRAMUSCULAR | Status: AC
  Administered 2016-04-05: 0.5 mg via INTRAVENOUS
  Filled 2016-04-05: qty 1

## 2016-04-05 MED ORDER — BUPIVACAINE-EPINEPHRINE 0.25% -1:200000 IJ SOLN
INTRAMUSCULAR | Status: DC | PRN
  Administered 2016-04-05: 8 mL

## 2016-04-05 MED ORDER — GLYCOPYRROLATE 0.2 MG/ML IJ SOLN
INTRAMUSCULAR | Status: DC | PRN
  Administered 2016-04-05: 0.4 mg via INTRAVENOUS

## 2016-04-05 MED ORDER — MIDAZOLAM HCL 5 MG/5ML IJ SOLN
INTRAMUSCULAR | Status: DC | PRN
  Administered 2016-04-05 (×2): 1 mg via INTRAVENOUS

## 2016-04-05 MED ORDER — MORPHINE SULFATE (PF) 2 MG/ML IV SOLN
1.0000 mg | INTRAVENOUS | Status: DC | PRN
  Administered 2016-04-05 – 2016-04-06 (×2): 1 mg via INTRAVENOUS
  Filled 2016-04-05 (×2): qty 1

## 2016-04-05 MED ORDER — PROPOFOL 10 MG/ML IV BOLUS
INTRAVENOUS | Status: DC | PRN
  Administered 2016-04-05: 100 mg via INTRAVENOUS

## 2016-04-05 MED ORDER — ONDANSETRON HCL 4 MG/2ML IJ SOLN
INTRAMUSCULAR | Status: DC | PRN
  Administered 2016-04-05: 4 mg via INTRAVENOUS

## 2016-04-05 MED ORDER — ROCURONIUM BROMIDE 50 MG/5ML IV SOLN
INTRAVENOUS | Status: AC
  Filled 2016-04-05: qty 1

## 2016-04-05 MED ORDER — ONDANSETRON 4 MG PO TBDP: 4.0000 mg | ORAL_TABLET | Freq: Four times a day (QID) | ORAL | Status: DC | PRN

## 2016-04-05 MED ORDER — ONDANSETRON HCL 4 MG/2ML IJ SOLN
4.0000 mg | Freq: Once | INTRAMUSCULAR | Status: DC | PRN
Start: 2016-04-05 — End: 2016-04-05

## 2016-04-05 MED ORDER — ALLOPURINOL 100 MG PO TABS
100.0000 mg | ORAL_TABLET | Freq: Every day | ORAL | Status: DC
  Administered 2016-04-06: 100 mg via ORAL
  Filled 2016-04-05: qty 1

## 2016-04-05 MED ORDER — ONDANSETRON HCL 4 MG/2ML IJ SOLN
INTRAMUSCULAR | Status: AC
  Filled 2016-04-05: qty 2

## 2016-04-05 MED ORDER — ENOXAPARIN SODIUM 40 MG/0.4ML ~~LOC~~ SOLN
40.0000 mg | SUBCUTANEOUS | Status: DC
  Administered 2016-04-06: 40 mg via SUBCUTANEOUS
  Filled 2016-04-05: qty 0.4

## 2016-04-05 MED ORDER — OXYCODONE-ACETAMINOPHEN 10-325 MG PO TABS: 1.0000 | ORAL_TABLET | Freq: Four times a day (QID) | ORAL | Status: DC | PRN

## 2016-04-05 MED ORDER — BUPIVACAINE-EPINEPHRINE (PF) 0.25% -1:200000 IJ SOLN
INTRAMUSCULAR | Status: AC
  Filled 2016-04-05: qty 30

## 2016-04-05 SURGICAL SUPPLY — 48 items
APL SKNCLS STERI-STRIP NONHPOA (GAUZE/BANDAGES/DRESSINGS)
BENZOIN TINCTURE PRP APPL 2/3 (GAUZE/BANDAGES/DRESSINGS) IMPLANT
BLADE SURG 15 STRL LF DISP TIS (BLADE) IMPLANT
BLADE SURG 15 STRL SS (BLADE) ×3
BLADE SURG ROTATE 9660 (MISCELLANEOUS) IMPLANT
CANISTER SUCTION 2500CC (MISCELLANEOUS) IMPLANT
CHLORAPREP W/TINT 26ML (MISCELLANEOUS) ×3 IMPLANT
CLOSURE WOUND 1/2 X4 (GAUZE/BANDAGES/DRESSINGS) ×1
COVER SURGICAL LIGHT HANDLE (MISCELLANEOUS) ×3 IMPLANT
DECANTER SPIKE VIAL GLASS SM (MISCELLANEOUS) ×3 IMPLANT
DRAPE LAPAROTOMY T 98X78 PEDS (DRAPES) ×3 IMPLANT
DRAPE UTILITY XL STRL (DRAPES) ×2 IMPLANT
ELECT CAUTERY BLADE 6.4 (BLADE) ×3 IMPLANT
ELECT REM PT RETURN 9FT ADLT (ELECTROSURGICAL) ×3
ELECTRODE REM PT RTRN 9FT ADLT (ELECTROSURGICAL) ×1 IMPLANT
GAUZE SPONGE 2X2 8PLY STRL LF (GAUZE/BANDAGES/DRESSINGS) IMPLANT
GAUZE SPONGE 4X4 16PLY XRAY LF (GAUZE/BANDAGES/DRESSINGS) ×3 IMPLANT
GLOVE BIO SURGEON STRL SZ7 (GLOVE) ×3 IMPLANT
GLOVE BIOGEL PI IND STRL 7.5 (GLOVE) ×1 IMPLANT
GLOVE BIOGEL PI INDICATOR 7.5 (GLOVE) ×2
GOWN STRL REUS W/ TWL LRG LVL3 (GOWN DISPOSABLE) ×2 IMPLANT
GOWN STRL REUS W/TWL LRG LVL3 (GOWN DISPOSABLE) ×6
KIT BASIN OR (CUSTOM PROCEDURE TRAY) ×3 IMPLANT
KIT ROOM TURNOVER OR (KITS) ×3 IMPLANT
LIQUID BAND (GAUZE/BANDAGES/DRESSINGS) ×2 IMPLANT
MESH VENTRALEX ST 2.5 CRC MED (Mesh General) ×2 IMPLANT
NDL HYPO 25GX1X1/2 BEV (NEEDLE) ×1 IMPLANT
NEEDLE HYPO 25GX1X1/2 BEV (NEEDLE) ×3 IMPLANT
NS IRRIG 1000ML POUR BTL (IV SOLUTION) ×3 IMPLANT
PACK SURGICAL SETUP 50X90 (CUSTOM PROCEDURE TRAY) ×3 IMPLANT
PAD ARMBOARD 7.5X6 YLW CONV (MISCELLANEOUS) ×6 IMPLANT
PENCIL BUTTON HOLSTER BLD 10FT (ELECTRODE) ×3 IMPLANT
SPONGE GAUZE 2X2 STER 10/PKG (GAUZE/BANDAGES/DRESSINGS)
STRIP CLOSURE SKIN 1/2X4 (GAUZE/BANDAGES/DRESSINGS) ×1 IMPLANT
SUT ETHIBOND 0 MO6 C/R (SUTURE) ×2 IMPLANT
SUT MNCRL AB 4-0 PS2 18 (SUTURE) ×5 IMPLANT
SUT PROLENE 0 CT 1 CR/8 (SUTURE) IMPLANT
SUT VIC AB 2-0 CT1 27 (SUTURE) ×3
SUT VIC AB 2-0 CT1 TAPERPNT 27 (SUTURE) ×1 IMPLANT
SUT VIC AB 3-0 SH 27 (SUTURE) ×3
SUT VIC AB 3-0 SH 27X BRD (SUTURE) ×1 IMPLANT
SUT VICRYL AB 2 0 TIES (SUTURE) ×3 IMPLANT
SYR CONTROL 10ML LL (SYRINGE) ×3 IMPLANT
TOWEL OR 17X26 10 PK STRL BLUE (TOWEL DISPOSABLE) ×3 IMPLANT
TUBE CONNECTING 12'X1/4 (SUCTIONS)
TUBE CONNECTING 12X1/4 (SUCTIONS) IMPLANT
WATER STERILE IRR 1000ML POUR (IV SOLUTION) IMPLANT
YANKAUER SUCT BULB TIP NO VENT (SUCTIONS) IMPLANT

## 2016-04-05 NOTE — H&P (Signed)
65 yof who I know from history of breast cancer for which she is doing well. she underwent left mrm followed by chemo/rads and five years of adjuvant hormonal therapy. she also has history of cva, cab. she is on plavix/asa. she has noted some umbilical discomfort and a bulge. she is here to have that evaluated. she has recent csc and has some distention with constipation.  Other Problems  Breast Cancer Cerebrovascular Accident Diabetes Mellitus Myocardial infarction Umbilical Hernia Repair  Past Surgical History Cesarean Section - 1 Coronary Artery Bypass Graft Gallbladder Surgery - Laparoscopic Mastectomy Left.  Diagnostic Studies History  Mammogram within last year  Allergies  No Known Drug Allergies  Medication History  Gabapentin (400MG  Capsule, Oral) Active. Atorvastatin Calcium (80MG  Tablet, Oral) Active. Fosamax (70MG  Tablet, Oral) Active. Plavix (75MG  Tablet, Oral) Active. Folic Acid (1MG  Tablet, Oral) Active. Norco (5-325MG  Tablet, Oral) Active. NovoLIN R (100UNIT/ML Solution, Injection) Active. Lantus SoloStar (100UNIT/ML Soln Pen-inj, Subcutaneous) Active. MetFORMIN HCl (500MG  Tablet, Oral) Active. Bayer Aspirin (325MG  Tablet, Oral) Active. Multiple Vitamin (Oral) Active. Allopurinol (100MG  Tablet, Oral) Active. Medications Reconciled  Social History Caffeine use Carbonated beverages, Coffee. No alcohol use No drug use Tobacco use Never smoker.  Family History  Alcohol Abuse Father. Bleeding disorder Brother. Breast Cancer Mother. Cancer Brother. Cerebrovascular Accident Mother. Diabetes Mellitus Brother, Mother, Sister. Heart Disease Brother, Daughter, Father, Son. Heart disease in female family member before age 65 Hypertension Brother. Migraine Headache Son. Respiratory Condition Brother.  Review of Systems General Not Present- Appetite Loss, Chills, Fatigue, Fever, Night Sweats, Weight Gain and Weight  Loss. Skin Not Present- Change in Wart/Mole, Dryness, Hives, Jaundice, New Lesions, Non-Healing Wounds, Rash and Ulcer. HEENT Present- Hearing Loss and Wears glasses/contact lenses. Not Present- Earache, Hoarseness, Nose Bleed, Oral Ulcers, Ringing in the Ears, Seasonal Allergies, Sinus Pain, Sore Throat, Visual Disturbances and Yellow Eyes. Respiratory Not Present- Bloody sputum, Chronic Cough, Difficulty Breathing, Snoring and Wheezing. Breast Present- Breast Pain. Not Present- Breast Mass, Nipple Discharge and Skin Changes. Cardiovascular Present- Leg Cramps and Swelling of Extremities. Not Present- Chest Pain, Difficulty Breathing Lying Down, Palpitations, Rapid Heart Rate and Shortness of Breath. Gastrointestinal Present- Abdominal Pain, Bloating, Constipation and Excessive gas. Not Present- Bloody Stool, Change in Bowel Habits, Chronic diarrhea, Difficulty Swallowing, Gets full quickly at meals, Hemorrhoids, Indigestion, Nausea, Rectal Pain and Vomiting. Female Genitourinary Present- Frequency and Urgency. Not Present- Nocturia, Painful Urination and Pelvic Pain. Musculoskeletal Present- Back Pain. Not Present- Joint Pain, Joint Stiffness, Muscle Pain, Muscle Weakness and Swelling of Extremities. Neurological Present- Headaches, Tingling and Trouble walking. Not Present- Decreased Memory, Fainting, Numbness, Seizures, Tremor and Weakness. Psychiatric Present- Anxiety and Depression. Not Present- Bipolar, Change in Sleep Pattern, Fearful and Frequent crying. Endocrine Present- Cold Intolerance. Not Present- Excessive Hunger, Hair Changes, Heat Intolerance, Hot flashes and New Diabetes. Hematology Present- Easy Bruising and Excessive bleeding. Not Present- Gland problems, HIV and Persistent Infections.  Vitals  Weight: 143 lb Height: 64in Body Surface Area: 1.7 m Body Mass Index: 24.55 kg/m  Temp.: 97.54F  Pulse: 60 (Regular)  BP: 124/68 (Sitting, Left Arm, Standard) Physical  Exam  General Mental Status-Alert. Orientation-Oriented X3. Chest and Lung Exam Chest and lung exam reveals -on auscultation, normal breath sounds, no adventitious sounds and normal vocal resonance. Cardiovascular Cardiovascular examination reveals -normal heart sounds, regular rate and rhythm with no murmurs. Abdomen Note: soft tender at umbilicus, reducible one cm umbo hernia   Assessment & Plan  UMBILICAL HERNIA ([id number]) Story: I think reasonable to  consider repair given symptoms and risk moving forward. If she is evaluated by cardiology and is cleared will schedule for open umbo hernia repair with mesh.. discussed risks of surgery

## 2016-04-05 NOTE — Op Note (Signed)
Preoperative diagnosis:  Umbilical incisional hernia  Postoperative diagnosis: Same as above Procedure: Umbilical hernia repair with 6.4 cm ventrallex mesh Surgeon: Dr. Serita Grammes Anesthesia: Gen. Estimated blood loss: Minimal Specimens: None Drains: None Complications: None Sponge and needle count was correct at completion Disposition to recovery in stable condition  Indications: This is a 65 year old female who is a prior breast cancer patient of mine whocomes in with a symptomatic umbilical hernia. She has had prior surgery. She has numerous comorbidities. We elected to perform an open umbilical hernia repair with mesh after she was evaluated by cardiology.  Procedure: After informed consent was obtained she was taken to the operating room. She was given antibiotics. Sequential compression devices were on her legs. She was then placed under general anesthesia without complication. Her abdomen was prepped and draped in the standard sterile surgical fashion. A surgical timeout was then performed.  I infiltrated lidocaine below her umbilicus. I then made a curvilinear incision. I carried this down to the umbilical stalk. This was encircled and divided. There was a hernia containing some preperitoneal fat. She ended up having about a 1 cm hernia and then superior to that  another 4 mm defect. I connected these 2 and made one larger defect. This was actually closed primarily fairly easily. Due to the fact that it was incisional I placed a 6.4 cm ventralex patch. I pulled the ribbons out of this. I secured these to the fascia with #1 Ethibond suture. I then cut these. I then closed the defect over this with #1 Ethibond suture. There is no evidence of any more defect and the mesh appeared to be in good position. I then obtained hemostasis. I then sewed the umbilicus back down to the fascia with a 3-0 Vicryl suture. I closed the incision with 3-0 Vicryl and 4-0 Monocryl. Glue was applied. She  tolerated this well was extubated and transferred to the recovery in stable condition.

## 2016-04-05 NOTE — Progress Notes (Signed)
Pt admitted from pacu this evening. Alert but unable to provide admission answers as she keeps drifting back to sleep

## 2016-04-05 NOTE — Transfer of Care (Signed)
Immediate Anesthesia Transfer of Care Note  Patient: [name]  Procedure(s) Performed: Procedure(s): UMBILICAL HERNIA REPAIR (N/A) INSERTION OF MESH (N/A)  Patient Location: PACU  Anesthesia Type:General  Level of Consciousness: awake, alert  and oriented  Airway & Oxygen Therapy: Patient Spontanous Breathing and Patient connected to face mask oxygen  Post-op Assessment: Report given to RN, Post -op Vital signs reviewed and stable and Patient moving all extremities X 4  Post vital signs: Reviewed and stable  Last Vitals:  Filed Vitals:   04/05/16 1416 04/05/16 1417  BP:    Pulse: 61 59  Temp:    Resp: 14 10    Last Pain:  Filed Vitals:   04/05/16 1419  PainSc: 0-No pain         Complications: No apparent anesthesia complications

## 2016-04-05 NOTE — Anesthesia Postprocedure Evaluation (Signed)
Anesthesia Post Note  Patient: [name]  Procedure(s) Performed: Procedure(s) (LRB): UMBILICAL HERNIA REPAIR (N/A) INSERTION OF MESH (N/A)  Patient location during evaluation: PACU Anesthesia Type: General Level of consciousness: awake and alert Pain management: pain level controlled Vital Signs Assessment: post-procedure vital signs reviewed and stable Respiratory status: spontaneous breathing, nonlabored ventilation, respiratory function stable and patient connected to nasal cannula oxygen Cardiovascular status: blood pressure returned to baseline and stable Postop Assessment: no signs of nausea or vomiting Anesthetic complications: no    Last Vitals:  Filed Vitals:   04/05/16 1417 04/05/16 1428  BP:    Pulse: 59 54  Temp:    Resp: 10 9    Last Pain:  Filed Vitals:   04/05/16 1429  PainSc: 0-No pain                 Rillie Riffel DAVID

## 2016-04-05 NOTE — Anesthesia Preprocedure Evaluation (Addendum)
Anesthesia Evaluation  Patient identified by MRN, date of birth, ID band Patient awake    Reviewed: Allergy & Precautions, NPO status , Patient's Chart, lab work & pertinent test results  Airway Mallampati: I  TM Distance: >3 FB Neck ROM: Full    Dental  (+) Edentulous Upper, Edentulous Lower   Pulmonary    Pulmonary exam normal        Cardiovascular hypertension, Pt. on medications + CAD  Normal cardiovascular exam     Neuro/Psych    GI/Hepatic   Endo/Other  diabetes, Type 2, Oral Hypoglycemic Agents  Renal/GU      Musculoskeletal   Abdominal   Peds  Hematology   Anesthesia Other Findings   Reproductive/Obstetrics                            Anesthesia Physical Anesthesia Plan  ASA: III  Anesthesia Plan: General   Post-op Pain Management:    Induction: Intravenous  Airway Management Planned: Oral ETT  Additional Equipment:   Intra-op Plan:   Post-operative Plan: Extubation in OR  Informed Consent: I have reviewed the patients History and Physical, chart, labs and discussed the procedure including the risks, benefits and alternatives for the proposed anesthesia with the patient or authorized representative who has indicated his/her understanding and acceptance.   Dental advisory given  Plan Discussed with: CRNA and Surgeon  Anesthesia Plan Comments:        Anesthesia Quick Evaluation

## 2016-04-05 NOTE — Anesthesia Procedure Notes (Signed)
Procedure Name: Intubation Date/Time: 04/05/2016 1:14 PM Performed by: Rush Farmer E Pre-anesthesia Checklist: Patient identified, Emergency Drugs available, Suction available, Patient being monitored and Timeout performed Patient Re-evaluated:Patient Re-evaluated prior to inductionOxygen Delivery Method: Circle system utilized Preoxygenation: Pre-oxygenation with 100% oxygen Intubation Type: IV induction Ventilation: Mask ventilation without difficulty Laryngoscope Size: Mac and 3 Grade View: Grade I Tube type: Oral Tube size: 7.0 mm Number of attempts: 1 Airway Equipment and Method: Stylet Placement Confirmation: ETT inserted through vocal cords under direct vision,  positive ETCO2 and breath sounds checked- equal and bilateral Secured at: 20 cm Tube secured with: Tape Dental Injury: Teeth and Oropharynx as per pre-operative assessment

## 2016-04-05 NOTE — Interval H&P Note (Signed)
History and Physical Interval Note:  04/05/2016 12:20 PM  [name]  has presented today for surgery, with the diagnosis of Umbilical hernia   The various methods of treatment have been discussed with the patient and family. After consideration of risks, benefits and other options for treatment, the patient has consented to  Procedure(s): OPEN HERNIA REPAIR UMBILICAL ADULT WITH MESH  (N/A) INSERTION OF MESH (N/A) as a surgical intervention .  The patient's history has been reviewed, patient examined, no change in status, stable for surgery.  I have reviewed the patient's chart and labs.  Questions were answered to the patient's satisfaction.     Shavanna Furnari

## 2016-04-06 ENCOUNTER — Encounter (HOSPITAL_COMMUNITY): Payer: Self-pay | Admitting: General Surgery

## 2016-04-06 DIAGNOSIS — K420 Umbilical hernia with obstruction, without gangrene: Secondary | ICD-10-CM | POA: Diagnosis not present

## 2016-04-06 LAB — GLUCOSE, CAPILLARY: Glucose-Capillary: 106 mg/dL — ABNORMAL HIGH (ref 65–99)

## 2016-04-06 MED ORDER — OXYCODONE HCL 5 MG PO TABS: 5.0000 mg | ORAL_TABLET | Freq: Four times a day (QID) | ORAL | Status: DC | PRN

## 2016-04-06 NOTE — Progress Notes (Signed)
Pt scheduled for discharge home this morning

## 2016-04-06 NOTE — Discharge Instructions (Signed)
[facility] ° °UMBILICAL OR INGUINAL HERNIA REPAIR: POST OP INSTRUCTIONS ° °Always review your discharge instruction sheet given to you by the facility where your surgery was performed. °IF YOU HAVE DISABILITY OR FAMILY LEAVE FORMS, YOU MUST BRING THEM TO THE OFFICE FOR PROCESSING.   °DO NOT GIVE THEM TO YOUR DOCTOR. ° °1. A  prescription for pain medication may be given to you upon discharge.  Take your pain medication as prescribed, if needed.  If narcotic pain medicine is not needed, then you may take acetaminophen (Tylenol), naprosyn (Alleve) or ibuprofen (Advil) as needed. °2. Take your usually prescribed medications unless otherwise directed. °3. If you need a refill on your pain medication, please contact your pharmacy.  They will contact our office to request authorization. Prescriptions will not be filled after 5 pm or on week-ends. °4. You should follow a light diet the first 24 hours after arrival home, such as soup and crackers, etc.  Be sure to include lots of fluids daily.  Resume your normal diet the day after surgery. °5. Most patients will experience some swelling and bruising around the umbilicus or in the groin and scrotum.  Ice packs and reclining will help.  Swelling and bruising can take several days to resolve.  °6. It is common to experience some constipation if taking pain medication after surgery.  Increasing fluid intake and taking a stool softener (such as Colace) will usually help or prevent this problem from occurring.  A mild laxative (Milk of Magnesia or Miralax) should be taken according to package directions if there are no bowel movements after 48 hours. °7. Unless discharge instructions indicate otherwise, you may remove your bandages 48 hours after surgery, and you may shower at that time.  You may have steri-strips (small skin tapes) in place directly over the incision.  These strips should be left on the skin for 7-10 days and will come off on their own.   If your surgeon used skin glue on the incision, you may shower in 24 hours.  The glue will flake off over the next 2-3 weeks.  Any sutures or staples will be removed at the office during your follow-up visit. °8. ACTIVITIES:  You may resume regular (light) daily activities beginning the next day--such as daily self-care, walking, climbing stairs--gradually increasing activities as tolerated.  You may have sexual intercourse when it is comfortable.  Refrain from any heavy lifting or straining until approved by your doctor. °a. You may drive when you are no longer taking prescription pain medication, you can comfortably wear a seatbelt, and you can safely maneuver your car and apply brakes. °b. RETURN TO WORK:  __________________________________________________________ °9. You should see your doctor in the office for a follow-up appointment approximately 2-3 weeks after your surgery.  Make sure that you call for this appointment within a day or two after you arrive home to insure a convenient appointment time. °10. OTHER INSTRUCTIONS:  __________________________________________________________________________________________________________________________________________________________________________________________  °WHEN TO CALL YOUR DOCTOR: °1. Fever over 101.0 °2. Inability to urinate °3. Nausea and/or vomiting °4. Extreme swelling or bruising °5. Continued bleeding from incision. °6. Increased pain, redness, or drainage from the incision ° °The clinic staff is available to answer your questions during regular business hours.  Please don’t hesitate to call and ask to speak to one of the nurses for clinical concerns.  If you have a medical emergency, go to the nearest emergency room or call 911.  A surgeon from [facility]   is always on call at the hospital ° ° °[address], [address], [place], [place]  [address] ? ° [address] [address], [place], [place]   [address] °[phone] ? [phone] ? FAX  [phone] °Web site: [email or web address] ° ° °

## 2016-04-06 NOTE — Discharge Summary (Signed)
Physician Discharge Summary  Patient ID: [name] MRN: [id number] DOB/AGE: Jul 27, 1951 65 y.o.  Admit date: 04/05/2016 Discharge date: 04/06/2016  Admission Diagnoses: Hypertension Diabetes Incisional hernia  Discharge Diagnoses:  Active Problems:   Incisional hernia   Discharged Condition: good  Hospital Course: 51 yof with history of breast cancer and cad admitted and underwent mesh repair of incisional umbilical hernia. She was monitored overnight and is doing well this am with no events noted.  Will be discharged home.   Consults: None  Significant Diagnostic Studies: none  Treatments: surgery: open incisional hernia repair  Discharge Exam: Blood pressure 102/51, pulse 75, temperature 99.5 F (37.5 C), temperature source Oral, resp. rate 14, height 5\' 4"  (1.626 m), weight 63.504 kg (140 lb), SpO2 92 %. GI: soft approp tender incision clean  Disposition: 01-Home or Self Care     Medication List    TAKE these medications        allopurinol 100 MG tablet  Commonly known as:  ZYLOPRIM  Take 100 mg by mouth daily.     aspirin 81 MG chewable tablet  Chew 81 mg by mouth daily.     atorvastatin 80 MG tablet  Commonly known as:  LIPITOR  TAKE ONE TABLET BY MOUTH ONCE DAILY NEEDS  APPOINTMENT  FOR  ADDITIONAL  REFILLS     clopidogrel 75 MG tablet  Commonly known as:  PLAVIX  Take 75 mg by mouth daily.     denosumab 60 MG/ML Soln injection  Commonly known as:  PROLIA  Inject 60 mg into the skin every 6 (six) months. Administer in upper arm, thigh, or abdomen     folic acid 1 MG tablet  Commonly known as:  FOLVITE  Take 1 mg by mouth daily.     furosemide 20 MG tablet  Commonly known as:  LASIX  Take 20 mg by mouth daily.     gabapentin 400 MG capsule  Commonly known as:  NEURONTIN  Take 400 mg by mouth 3 (three) times daily.     ibuprofen 200 MG tablet  Commonly known as:  ADVIL,MOTRIN  Take 400 mg by mouth every 8 (eight) hours as needed for  headache or moderate pain.     insulin aspart 100 UNIT/ML injection  Commonly known as:  novoLOG  Inject 1-15 Units into the skin 3 (three) times daily. PER SLIDING SCALE-PATIENT SCALE     insulin glargine 100 UNIT/ML injection  Commonly known as:  LANTUS  Inject 20 Units into the skin at bedtime.     metFORMIN 500 MG tablet  Commonly known as:  GLUCOPHAGE  Take 1,000 mg by mouth 2 (two) times daily with a meal.     oxyCODONE 5 MG immediate release tablet  Commonly known as:  Oxy IR/ROXICODONE  Take 1 tablet (5 mg total) by mouth every 6 (six) hours as needed for moderate pain.     oxyCODONE-acetaminophen 10-325 MG tablet  Commonly known as:  PERCOCET  Take 1 tablet by mouth every 6 (six) hours as needed for pain.           Follow-up Information    Follow up with [facility] In 3 weeks.   Specialty:  General Surgery   Contact information:   [place] [address] [phone]       Signed: Rolm Bookbinder 04/06/2016, 8:23 AM

## 2016-05-25 ENCOUNTER — Ambulatory Visit (INDEPENDENT_AMBULATORY_CARE_PROVIDER_SITE_OTHER): Payer: Medicaid Other | Admitting: Cardiology

## 2016-05-25 VITALS — BP 157/66 | HR 62 | Ht 64.0 in | Wt 140.0 lb

## 2016-05-25 DIAGNOSIS — I10 Essential (primary) hypertension: Secondary | ICD-10-CM

## 2016-05-25 DIAGNOSIS — E785 Hyperlipidemia, unspecified: Secondary | ICD-10-CM | POA: Diagnosis not present

## 2016-05-25 DIAGNOSIS — R0989 Other specified symptoms and signs involving the circulatory and respiratory systems: Secondary | ICD-10-CM

## 2016-05-25 DIAGNOSIS — I779 Disorder of arteries and arterioles, unspecified: Secondary | ICD-10-CM | POA: Diagnosis not present

## 2016-05-25 DIAGNOSIS — I739 Peripheral vascular disease, unspecified: Secondary | ICD-10-CM

## 2016-05-25 DIAGNOSIS — I2510 Atherosclerotic heart disease of native coronary artery without angina pectoris: Secondary | ICD-10-CM

## 2016-05-25 MED ORDER — LISINOPRIL 2.5 MG PO TABS: 2.5000 mg | ORAL_TABLET | Freq: Every day | ORAL | Status: DC

## 2016-05-25 MED ORDER — PRAVASTATIN SODIUM 20 MG PO TABS: 20.0000 mg | ORAL_TABLET | Freq: Every evening | ORAL | Status: DC

## 2016-05-25 NOTE — Progress Notes (Signed)
Clinical Summary Ms. Hehr is a 65 y.o.female seen today for follow up of the following medical problems.   1. CAD - hx of CABG in 2007, normal LVEF 60% - since last visit on 02/2016 she had nuclear stress without clear ischemia.  - no recent chest pain or SOB  2. HTN - reports history of low bp's, had to stop meds in the past  3. HL - she stopped lipitor due to dizziness.   4. Hx of CVA - she is on both ASA and plavix, she is unsure exactly why. Assume its related to her hx of CVA as no clear cardiac indication to be on plavix from records   SH: husband passed 6 months, brother 2 months ago Past Medical History  Diagnosis Date  . Diabetes mellitus   . Hypertension   . Cancer [facility])     Left mastectomy, 2/11  . Neuropathic pain, leg   . Coronary artery disease     cabg 2007  . Neuromuscular disorder ([place])   . Arthritis   . HLD (hyperlipidemia)   . Stroke, lacunar ([place])     Followed by Dr. Leonie Man  . Carotid disease, bilateral ([place])     Followed by Dr. Leonie Man  . Carotid disease, bilateral ([place]) 10/21/2011  . Stroke [facility])     more than 10 yrs weakness rt leg  . Anxiety     no rx     No Known Allergies   Current Outpatient Prescriptions  Medication Sig Dispense Refill  . allopurinol (ZYLOPRIM) 100 MG tablet Take 100 mg by mouth daily.     Marland Kitchen aspirin 81 MG chewable tablet Chew 81 mg by mouth daily.      Marland Kitchen atorvastatin (LIPITOR) 80 MG tablet TAKE ONE TABLET BY MOUTH ONCE DAILY NEEDS  APPOINTMENT  FOR  ADDITIONAL  REFILLS 90 tablet 3  . clopidogrel (PLAVIX) 75 MG tablet Take 75 mg by mouth daily.     Marland Kitchen denosumab (PROLIA) 60 MG/ML SOLN injection Inject 60 mg into the skin every 6 (six) months. Administer in upper arm, thigh, or abdomen    . folic acid (FOLVITE) 1 MG tablet Take 1 mg by mouth daily.      . furosemide (LASIX) 20 MG tablet Take 20 mg by mouth daily.    Marland Kitchen gabapentin (NEURONTIN) 400 MG capsule Take 400 mg by mouth 3 (three) times daily.    Marland Kitchen ibuprofen  (ADVIL,MOTRIN) 200 MG tablet Take 400 mg by mouth every 8 (eight) hours as needed for headache or moderate pain.    Marland Kitchen insulin aspart (NOVOLOG) 100 UNIT/ML injection Inject 1-15 Units into the skin 3 (three) times daily. PER SLIDING SCALE-PATIENT SCALE    . insulin glargine (LANTUS) 100 UNIT/ML injection Inject 20 Units into the skin at bedtime.     . metFORMIN (GLUCOPHAGE) 500 MG tablet Take 1,000 mg by mouth 2 (two) times daily with a meal.     . oxyCODONE (OXY IR/ROXICODONE) 5 MG immediate release tablet Take 1 tablet (5 mg total) by mouth every 6 (six) hours as needed for moderate pain. 15 tablet 0  . oxyCODONE-acetaminophen (PERCOCET) 10-325 MG tablet Take 1 tablet by mouth every 6 (six) hours as needed for pain.     No current facility-administered medications for this visit.     Past Surgical History  Procedure Laterality Date  . Mastectomy  2010    lt-nodes  . Orif ulnar fracture  10/01/2011    Procedure: OPEN  REDUCTION INTERNAL FIXATION (ORIF) ULNAR FRACTURE;  Surgeon: Kerin Salen;  Location: [facility];  Service: Orthopedics;  Laterality: Right;  and orif scaphoid  . Arm fx Left 11  . Fracture surgery    . Coronary artery bypass graft  07  . Umbilical hernia repair  04/05/2016    with 6.4 cm ventrallex mesh  . Umbilical hernia repair N/A 04/05/2016    Procedure: UMBILICAL HERNIA REPAIR;  Surgeon: Rolm Bookbinder, MD;  Location: [facility];  Service: General;  Laterality: N/A;  . Insertion of mesh N/A 04/05/2016    Procedure: INSERTION OF MESH;  Surgeon: Rolm Bookbinder, MD;  Location: [facility];  Service: General;  Laterality: N/A;     No Known Allergies    No family history on file.   Social History Ms. Popko reports that she has never smoked. She has never used smokeless tobacco. Ms. Mungin reports that she does not drink alcohol.   Review of Systems CONSTITUTIONAL: No weight loss, fever, chills, weakness or fatigue.  HEENT: Eyes: No visual loss, blurred vision, double  vision or yellow sclerae.No hearing loss, sneezing, congestion, runny nose or sore throat.  SKIN: No rash or itching.  CARDIOVASCULAR: per HPI RESPIRATORY: No shortness of breath, cough or sputum.  GASTROINTESTINAL: No anorexia, nausea, vomiting or diarrhea. No abdominal pain or blood.  GENITOURINARY: No burning on urination, no polyuria NEUROLOGICAL: No headache, dizziness, syncope, paralysis, ataxia, numbness or tingling in the extremities. No change in bowel or bladder control.  MUSCULOSKELETAL: No muscle, back pain, joint pain or stiffness.  LYMPHATICS: No enlarged nodes. No history of splenectomy.  PSYCHIATRIC: No history of depression or anxiety.  ENDOCRINOLOGIC: No reports of sweating, cold or heat intolerance. No polyuria or polydipsia.  Marland Kitchen   Physical Examination Filed Vitals:   05/25/16 1118  BP: 157/66  Pulse: 62   Filed Vitals:   05/25/16 1118  Height: 5\' 4"  (1.626 m)  Weight: 140 lb (63.504 kg)    Gen: resting comfortably, no acute distress HEENT: no scleral icterus, pupils equal round and reactive, no palptable cervical adenopathy,  CV: RRR, 2/6 systolic murmur RUSB, 2/6 systolic murmur apex, no jvd. Bilateral carotid bruits Resp: Clear to auscultation bilaterally GI: abdomen is soft, non-tender, non-distended, normal bowel sounds, no hepatosplenomegaly MSK: extremities are warm, no edema.  Skin: warm, no rash Neuro:  no focal deficits Psych: appropriate affect   Diagnostic Studies 12/2014 echo Study Conclusions  - Left ventricle: The cavity size was normal. Systolic function was normal. The estimated ejection fraction was in the range of 50% to 55%. Very mild concentric hypertrophy. Posterior wall measurement is overestimated. Wall motion was normal; there were no regional wall motion abnormalities. Doppler parameters are consistent with abnormal left ventricular relaxation (grade 1 diastolic dysfunction). Doppler parameters are consistent  with high ventricular filling pressure. - Mitral valve: There was mild regurgitation. - Left atrium: The atrium was mildly dilated. Volume/bsa, S: 30.5 ml/m^2. - Right ventricle: Systolic function was mildly reduced. - Tricuspid valve: There was mild regurgitation.   02/2016 Nuclear stress  There was no ST segment deviation noted during stress.  Findings consistent with prior small inferoseptal myocardial infarction with no current peri-infarct ischemia.  This is a low risk study. There is no myocardium currently at jeopardy.  The left ventricular ejection fraction is hyperdynamic (>65%). Assessment and Plan   1. CAD - no current symptoms, recent stress test without significant ischemia - continue current meds  2. HTN - elevated in clinic, appears in  the past bp meds discontinued due to low bp's - she will start lisionrpil 2.5mg  daily  3. HL -did not tolerate high dose statin, we will try pravastatin 20mg  daily  4. CVA - continue secondary prevention  5. Carotid bruits - obtain carotid US     Arnoldo Lenis, M.D.

## 2016-05-25 NOTE — Patient Instructions (Signed)
Your physician wants you to follow-up in: [place] DR. BRANCH You will receive a reminder letter in the mail two months in advance. If you don't receive a letter, please call our office to schedule the follow-up appointment.   Your physician has recommended you make the following change in your medication:   STOP LIPITOR   START PRAVASTATIN 20 MG DAILY  START LISINOPRIL 2.5 MG DAILY  Your physician has requested that you have a carotid duplex. This test is an ultrasound of the carotid arteries in your neck. It looks at blood flow through these arteries that supply the brain with blood. Allow one hour for this exam. There are no restrictions or special instructions.  Your physician recommends that you return for lab work in: 2 WEEKS BMP

## 2016-05-27 ENCOUNTER — Encounter: Payer: Self-pay | Admitting: Cardiology

## 2016-05-27 ENCOUNTER — Ambulatory Visit: Payer: Medicaid Other

## 2016-05-27 DIAGNOSIS — I779 Disorder of arteries and arterioles, unspecified: Secondary | ICD-10-CM

## 2016-05-27 DIAGNOSIS — I739 Peripheral vascular disease, unspecified: Principal | ICD-10-CM

## 2016-05-27 LAB — VAS US CAROTID
LCCADDIAS: -24 cm/s
LCCADSYS: -84 cm/s
LEFT ECA DIAS: -22 cm/s
LEFT VERTEBRAL DIAS: -23 cm/s
LICADDIAS: -19 cm/s
LICADSYS: -55 cm/s
LICAPSYS: -71 cm/s
Left CCA prox dias: 22 cm/s
Left CCA prox sys: 86 cm/s
Left ICA prox dias: -24 cm/s
RCCADSYS: -118 cm/s
RCCAPSYS: -101 cm/s
RIGHT ECA DIAS: -11 cm/s
RIGHT VERTEBRAL DIAS: -13 cm/s
Right CCA prox dias: -21 cm/s

## 2016-05-31 ENCOUNTER — Telehealth: Payer: Self-pay | Admitting: *Deleted

## 2016-05-31 NOTE — Telephone Encounter (Signed)
Pt aware, routed to pcp 

## 2016-05-31 NOTE — Telephone Encounter (Signed)
-----   Message from Arnoldo Lenis, MD sent at 05/31/2016  2:58 PM EDT ----- Carotid US overall looks good, no severe blockages. We will continue to monitor.   Zandra Abts MD

## 2016-06-17 ENCOUNTER — Telehealth: Payer: Self-pay | Admitting: *Deleted

## 2016-06-17 NOTE — Telephone Encounter (Signed)
Pt aware, routed to pcp 

## 2016-06-17 NOTE — Telephone Encounter (Signed)
-----   Message from Arnoldo Lenis, MD sent at 06/09/2016  1:19 PM EDT ----- Labs look good   Zandra Abts MD

## 2016-11-24 ENCOUNTER — Ambulatory Visit: Payer: Medicaid Other | Admitting: Cardiology

## 2016-11-26 ENCOUNTER — Ambulatory Visit (INDEPENDENT_AMBULATORY_CARE_PROVIDER_SITE_OTHER): Payer: Medicaid Other | Admitting: Cardiology

## 2016-11-26 ENCOUNTER — Encounter: Payer: Self-pay | Admitting: Cardiology

## 2016-11-26 VITALS — BP 148/73 | HR 61 | Ht 64.0 in | Wt 142.0 lb

## 2016-11-26 DIAGNOSIS — I2510 Atherosclerotic heart disease of native coronary artery without angina pectoris: Secondary | ICD-10-CM | POA: Diagnosis not present

## 2016-11-26 DIAGNOSIS — I10 Essential (primary) hypertension: Secondary | ICD-10-CM | POA: Diagnosis not present

## 2016-11-26 DIAGNOSIS — E782 Mixed hyperlipidemia: Secondary | ICD-10-CM

## 2016-11-26 MED ORDER — LISINOPRIL 5 MG PO TABS: 5.0000 mg | ORAL_TABLET | Freq: Every day | ORAL | 3 refills | Status: DC

## 2016-11-26 NOTE — Patient Instructions (Signed)
Your physician wants you to follow-up in: [place] DR. BRANCH You will receive a reminder letter in the mail two months in advance. If you don't receive a letter, please call our office to schedule the follow-up appointment.  Your physician has recommended you make the following change in your medication:   INCREASE LISINOPRIL 5 MG DAILY  WE HAVE GIVEN YOU A LIST OF PRIMARY CARE PHYSICIANS   Your physician recommends that you return for lab work CBC/TSH/LIPIDS/CBC/BMP/HGBA1C  Thank you for choosing [facility]!!

## 2016-11-26 NOTE — Progress Notes (Signed)
Clinical Summary Ms. Beza is a 66 y.o.female seen today for follow up of the following medical problems.   1. CAD - hx of CABG in 2007, normal LVEF 60% - 02/2016 she had nuclear stress without clear ischemia.   - denies any chest pain or SOB  2. HTN - last visit started lisinopril 2.5mg  daily. Tolerating without side effects  3. HL - she stopped lipitor due to dizziness.  - last visit started pravastatin 20mg  daily, tolerating well  4. Hx of CVA - she is on both ASA and plavix, she is unsure exactly why. Assume its related to her hx of CVA as no clear cardiac indication to be on plavix from records     SH: husband passed 6 months, brother 2 months ago   Past Medical History:  Diagnosis Date  . Anxiety    no rx  . Arthritis   . Cancer [facility])    Left mastectomy, 2/11  . Carotid disease, bilateral ([place])    Followed by Dr. Leonie Man  . Carotid disease, bilateral ([place]) 10/21/2011  . Coronary artery disease    cabg 2007  . Diabetes mellitus   . HLD (hyperlipidemia)   . Hypertension   . Neuromuscular disorder ([place])   . Neuropathic pain, leg   . Stroke [facility])    more than 10 yrs weakness rt leg  . Stroke, lacunar ([place])    Followed by Dr. Leonie Man     No Known Allergies   Current Outpatient Prescriptions  Medication Sig Dispense Refill  . allopurinol (ZYLOPRIM) 100 MG tablet Take 100 mg by mouth daily.     Marland Kitchen aspirin 81 MG chewable tablet Chew 81 mg by mouth daily.      . clopidogrel (PLAVIX) 75 MG tablet Take 75 mg by mouth daily.     Marland Kitchen denosumab (PROLIA) 60 MG/ML SOLN injection Inject 60 mg into the skin every 6 (six) months. Administer in upper arm, thigh, or abdomen    . folic acid (FOLVITE) 1 MG tablet Take 1 mg by mouth daily.      Marland Kitchen gabapentin (NEURONTIN) 400 MG capsule Take 400 mg by mouth 3 (three) times daily.    Marland Kitchen ibuprofen (ADVIL,MOTRIN) 200 MG tablet Take 400 mg by mouth every 8 (eight) hours as needed for headache or moderate pain.    Marland Kitchen insulin  aspart (NOVOLOG) 100 UNIT/ML injection Inject 1-15 Units into the skin 3 (three) times daily. PER SLIDING SCALE-PATIENT SCALE    . insulin glargine (LANTUS) 100 UNIT/ML injection Inject 22 Units into the skin at bedtime.     Marland Kitchen lisinopril (PRINIVIL,ZESTRIL) 2.5 MG tablet Take 1 tablet (2.5 mg total) by mouth daily. 90 tablet 3  . metFORMIN (GLUCOPHAGE) 500 MG tablet Take 1,000 mg by mouth 2 (two) times daily with a meal.     . Multiple Vitamin (THERA) TABS Take 1 tablet by mouth daily.    Marland Kitchen oxyCODONE-acetaminophen (PERCOCET) 10-325 MG tablet Take 1 tablet by mouth every 4 (four) hours as needed for pain.     . pravastatin (PRAVACHOL) 20 MG tablet Take 1 tablet (20 mg total) by mouth every evening. 90 tablet 3   No current facility-administered medications for this visit.      Past Surgical History:  Procedure Laterality Date  . arm fx Left 11  . CORONARY ARTERY BYPASS GRAFT  07  . FRACTURE SURGERY    . INSERTION OF MESH N/A 04/05/2016   Procedure: INSERTION OF MESH;  Surgeon:  Rolm Bookbinder, MD;  Location: [place];  Service: General;  Laterality: N/A;  . MASTECTOMY  2010   lt-nodes  . ORIF ULNAR FRACTURE  10/01/2011   Procedure: OPEN REDUCTION INTERNAL FIXATION (ORIF) ULNAR FRACTURE;  Surgeon: Kerin Salen;  Location: [place];  Service: Orthopedics;  Laterality: Right;  and orif scaphoid  . UMBILICAL HERNIA REPAIR  04/05/2016   with 6.4 cm ventrallex mesh  . UMBILICAL HERNIA REPAIR N/A 04/05/2016   Procedure: UMBILICAL HERNIA REPAIR;  Surgeon: Rolm Bookbinder, MD;  Location: [place];  Service: General;  Laterality: N/A;     No Known Allergies    No family history on file.   Social History Ms. Suljic reports that she has never smoked. She has never used smokeless tobacco. Ms. Schwager reports that she does not drink alcohol.   Review of Systems CONSTITUTIONAL: No weight loss, fever, chills, weakness or fatigue.  HEENT: Eyes: No visual loss, blurred vision, double vision or yellow  sclerae.No hearing loss, sneezing, congestion, runny nose or sore throat.  SKIN: No rash or itching.  CARDIOVASCULAR: per HPI RESPIRATORY: No shortness of breath, cough or sputum.  GASTROINTESTINAL: No anorexia, nausea, vomiting or diarrhea. No abdominal pain or blood.  GENITOURINARY: No burning on urination, no polyuria NEUROLOGICAL: No headache, dizziness, syncope, paralysis, ataxia, numbness or tingling in the extremities. No change in bowel or bladder control.  MUSCULOSKELETAL: No muscle, back pain, joint pain or stiffness.  LYMPHATICS: No enlarged nodes. No history of splenectomy.  PSYCHIATRIC: No history of depression or anxiety.  ENDOCRINOLOGIC: No reports of sweating, cold or heat intolerance. No polyuria or polydipsia.  Marland Kitchen   Physical Examination Vitals:   11/26/16 1305  BP: (!) 148/73  Pulse: 61   Vitals:   11/26/16 1305  Weight: 142 lb (64.4 kg)  Height: 5\' 4"  (1.626 m)    Gen: resting comfortably, no acute distress HEENT: no scleral icterus, pupils equal round and reactive, no palptable cervical adenopathy,  CV: RRR, 2/6 systolic murmur RUSB, no jvd Resp: Clear to auscultation bilaterally GI: abdomen is soft, non-tender, non-distended, normal bowel sounds, no hepatosplenomegaly MSK: extremities are warm, no edema.  Skin: warm, no rash Neuro:  no focal deficits Psych: appropriate affect   Diagnostic Studies  12/2014 echo Study Conclusions  - Left ventricle: The cavity size was normal. Systolic function was normal. The estimated ejection fraction was in the range of 50% to 55%. Very mild concentric hypertrophy. Posterior wall measurement is overestimated. Wall motion was normal; there were no regional wall motion abnormalities. Doppler parameters are consistent with abnormal left ventricular relaxation (grade 1 diastolic dysfunction). Doppler parameters are consistent with high ventricular filling pressure. - Mitral valve: There was mild  regurgitation. - Left atrium: The atrium was mildly dilated. Volume/bsa, S: 30.5 ml/m^2. - Right ventricle: Systolic function was mildly reduced. - Tricuspid valve: There was mild regurgitation.   05/2016 carotid US Elevated velocities RICA likely due to tortous vessel.    02/2016 Nuclear stress  There was no ST segment deviation noted during stress.  Findings consistent with prior small inferoseptal myocardial infarction with no current peri-infarct ischemia.  This is a low risk study. There is no myocardium currently at jeopardy.  The left ventricular ejection fraction is hyperdynamic (>65%).   Assessment and Plan   1. CAD - no current symptoms, recent stress test without significant ischemia - she will continue current meds at this time  2. HTN - elevated in clinic, we will increase lisionpril to 5mg  daily.  3. HL -did not tolerate high dose statin, now on pravastatin 20mg  daily - check lipid panel  4. CVA - continue secondary prevention   Check annual labs. F/u 6 months  Arnoldo Lenis, M.D.

## 2016-12-07 ENCOUNTER — Telehealth: Payer: Self-pay | Admitting: *Deleted

## 2016-12-07 NOTE — Telephone Encounter (Signed)
Pt aware - routed to pcp  

## 2016-12-07 NOTE — Telephone Encounter (Signed)
-----   Message from Arnoldo Lenis, MD sent at 12/07/2016  1:06 PM EST ----- Labs look good  Zandra Abts MD

## 2016-12-28 ENCOUNTER — Ambulatory Visit: Payer: Self-pay | Admitting: Family Medicine

## 2017-01-06 ENCOUNTER — Ambulatory Visit (INDEPENDENT_AMBULATORY_CARE_PROVIDER_SITE_OTHER): Payer: Medicaid Other | Admitting: Family Medicine

## 2017-01-06 ENCOUNTER — Encounter: Payer: Self-pay | Admitting: Family Medicine

## 2017-01-06 VITALS — BP 129/70 | HR 58 | Temp 97.2°F | Ht 64.0 in | Wt 145.4 lb

## 2017-01-06 DIAGNOSIS — E119 Type 2 diabetes mellitus without complications: Secondary | ICD-10-CM | POA: Diagnosis not present

## 2017-01-06 DIAGNOSIS — IMO0001 Reserved for inherently not codable concepts without codable children: Secondary | ICD-10-CM

## 2017-01-06 DIAGNOSIS — E785 Hyperlipidemia, unspecified: Secondary | ICD-10-CM | POA: Diagnosis not present

## 2017-01-06 DIAGNOSIS — F339 Major depressive disorder, recurrent, unspecified: Secondary | ICD-10-CM | POA: Diagnosis not present

## 2017-01-06 DIAGNOSIS — Z853 Personal history of malignant neoplasm of breast: Secondary | ICD-10-CM | POA: Insufficient documentation

## 2017-01-06 DIAGNOSIS — Z794 Long term (current) use of insulin: Secondary | ICD-10-CM

## 2017-01-06 LAB — BAYER DCA HB A1C WAIVED: HB A1C (BAYER DCA - WAIVED): 7.1 % — ABNORMAL HIGH (ref <7.0)

## 2017-01-06 MED ORDER — DULOXETINE HCL 30 MG PO CPEP: 30.0000 mg | ORAL_CAPSULE | Freq: Every day | ORAL | 1 refills | Status: DC

## 2017-01-06 NOTE — Patient Instructions (Signed)
Great to meet you!  Come back in 3-4 weeks to discuss depression  See our clinical pharmacist in 2 months to discuss osteoporosis and diabetes  Start Cymbalta 1 pill once daily for depression.

## 2017-01-06 NOTE — Progress Notes (Signed)
HPI  Patient presents today to establish care and to discuss chronic medical condition.  Diabetes Sometimes has low blood sugars, usually when she mixes up NovoLog for Lantus, she uses 2-4 units of NovoLog 3 or 4 times a day with meals. She is a 22 units of Lantus daily Been doing much better now that she is on tens.  Depression Has been having worsening depression for the last year, she lost her husband about 14 months ago. States she has that she be better off dead occasionally, she denies any plan to hurt herself or any intent. She contracts for safety.  Hyperlipidemia Medication compliance of pravastatin LDL 65 checked last month.  Patient has had colonoscopy in 2015, mammogram is scheduled for next week.  History of breast cancer, needs oncology referral for the summer., We discussed possible options, she will consider.   PMH: History of CVA with left-sided lower extremity residual weakness, history of CABG, hypertension, diabetes, hyperlipidemia, Surgical history of CABG, mastectomy for breast cancer on the left, umbilical hernia repair Social history: Denies alcohol and drug use Family history: Breast cancer in mother, diabetes in mother, diabetes in sister, heart disease and lung cancer in brother ROS: Per HPI  Objective: BP 129/70   Pulse (!) 58   Temp 97.2 F (36.2 C) (Oral)   Ht 5' 4" (1.626 m)   Wt 145 lb 6.4 oz (66 kg)   BMI 24.96 kg/m  Gen: NAD, alert, cooperative with exam HEENT: NCAT, EOMI, PERRL, oropharynx moist and clear with dentures on the top CV: RRR, good S1/S2, no murmur Resp: CTABL, no wheezes, non-labored Abd: SNTND, BS present, no guarding or organomegaly Ext: Trace to 1+ pitting edema bilaterally Neuro: Alert and oriented, strength 5/5 in right lower extremity, 4/5 and left lower extremity, 1+ symmetric patellar tendon reflexes bilaterally, normal sensation bilaterally  Diabetic Foot Exam - Simple   Simple Foot Form Diabetic Foot exam was  performed with the following findings:  Yes 01/06/2017  1:57 PM  Visual Inspection No deformities, no ulcerations, no other skin breakdown bilaterally:  Yes See comments:  Yes Sensation Testing Intact to touch and monofilament testing bilaterally:  Yes Pulse Check Posterior Tibialis and Dorsalis pulse intact bilaterally:  Yes Comments Great toenail removed bilaterally, some small amount of bleeding on the right toenail Plan she's had recent trauma while doing yard work.      Assessment and plan:  # Depression Worsening symptoms over the last 14 months Starting Cymbalta also with neuropathic pain 30 mg once daily, follow-up 3-4 weeks  # Insulin-dependent diabetes With mild diabetic neuropathy Continue Lantus plus NovoLog at current dose Consider GLP-1 Recommended 2 month appointment with clinical pharmacist A1c pending, goal less than 8  # HLD Continue pravastatin, LDL checked last month was less than 70   # History of breast cancer Discussed [facility], [facility], or [facility] for routine monitoring, approx 6 years out mammo next week    Orders Placed This Encounter  Procedures  . CBC with Differential/Platelet  . CMP14+EGFR  . Bayer DCA Hb A1c Waived  . LDL Cholesterol, Direct    Meds ordered this encounter  Medications  . lisinopril (PRINIVIL,ZESTRIL) 2.5 MG tablet    Sig: Take 2.5 mg by mouth daily.    Refill:  1  . DULoxetine (CYMBALTA) 30 MG capsule    Sig: Take 1 capsule (30 mg total) by mouth daily.    Dispense:  30 capsule    Refill:  1  Laroy Apple, MD [facility] 01/06/2017, 1:56 PM

## 2017-01-07 LAB — CMP14+EGFR
ALBUMIN: 3.9 g/dL (ref 3.6–4.8)
ALK PHOS: 105 IU/L (ref 39–117)
ALT: 21 IU/L (ref 0–32)
AST: 20 IU/L (ref 0–40)
Albumin/Globulin Ratio: 1.6 (ref 1.2–2.2)
BUN / CREAT RATIO: 21 (ref 12–28)
BUN: 18 mg/dL (ref 8–27)
CHLORIDE: 107 mmol/L — AB (ref 96–106)
CO2: 21 mmol/L (ref 18–29)
Calcium: 10.7 mg/dL — ABNORMAL HIGH (ref 8.7–10.3)
Creatinine, Ser: 0.85 mg/dL (ref 0.57–1.00)
GFR calc non Af Amer: 73 (ref >59)
GFR, EST AFRICAN AMERICAN: 84 (ref >59)
GLOBULIN, TOTAL: 2.4 (ref 1.5–4.5)
GLUCOSE: 126 mg/dL — AB (ref 65–99)
Potassium: 5 mmol/L (ref 3.5–5.2)
Sodium: 144 mmol/L (ref 134–144)
TOTAL PROTEIN: 6.3 g/dL (ref 6.0–8.5)

## 2017-01-16 ENCOUNTER — Other Ambulatory Visit: Payer: Self-pay | Admitting: Cardiology

## 2017-01-17 MED ORDER — LISINOPRIL 5 MG PO TABS: 5.0000 mg | ORAL_TABLET | Freq: Every day | ORAL | 3 refills | Status: DC

## 2017-01-31 ENCOUNTER — Other Ambulatory Visit: Payer: Self-pay | Admitting: Family Medicine

## 2017-02-03 ENCOUNTER — Ambulatory Visit (INDEPENDENT_AMBULATORY_CARE_PROVIDER_SITE_OTHER): Payer: Medicaid Other | Admitting: Family Medicine

## 2017-02-03 ENCOUNTER — Encounter: Payer: Self-pay | Admitting: Family Medicine

## 2017-02-03 VITALS — BP 130/70 | HR 72 | Temp 97.2°F | Ht 64.0 in | Wt 145.0 lb

## 2017-02-03 DIAGNOSIS — Z794 Long term (current) use of insulin: Secondary | ICD-10-CM

## 2017-02-03 DIAGNOSIS — I10 Essential (primary) hypertension: Secondary | ICD-10-CM | POA: Diagnosis not present

## 2017-02-03 DIAGNOSIS — F339 Major depressive disorder, recurrent, unspecified: Secondary | ICD-10-CM

## 2017-02-03 DIAGNOSIS — E119 Type 2 diabetes mellitus without complications: Secondary | ICD-10-CM

## 2017-02-03 DIAGNOSIS — IMO0001 Reserved for inherently not codable concepts without codable children: Secondary | ICD-10-CM

## 2017-02-03 LAB — HM DIABETES EYE EXAM

## 2017-02-03 MED ORDER — DULOXETINE HCL 30 MG PO CPEP: 30.0000 mg | ORAL_CAPSULE | Freq: Every day | ORAL | 3 refills | Status: DC

## 2017-02-03 NOTE — Progress Notes (Signed)
   HPI  Patient presents today here seen for follow-up for depression, hypertension, diabetes.  Patient establish care last month, A1c was found to be 7.1. Her insulin and other medications are going well.  Depression Improved on Cymbalta, patient still has occasional thoughts of being better off dead, however states that she would never hurt herself and contracts for safety. No side effects from medication, her daughter is noticed a big difference.  Hypertension Diet controlled  PMH: Smoking status noted ROS: Per HPI  Objective: BP 130/70   Pulse 72   Temp 97.2 F (36.2 C) (Oral)   Ht 5\' 4"  (1.626 m)   Wt 145 lb (65.8 kg)   BMI 24.89 kg/m  Gen: NAD, alert, cooperative with exam HEENT: NCAT CV: RRR, good S1/S2, no murmur Resp: CTABL, no wheezes, non-labored Ext: No edema, warm Neuro: Alert and oriented, No gross deficits  Assessment and plan:  # Depression Severe, improving Passive suicidal thoughts, contracts for safety Continue Cymbalta 30 mg Follow-up one month for Pap smear, 2 months after that with me.  # Hypertension Diet controlled, initially elevated in our clinic, however after resting and checking manually it's back to normal range.  # Diabetes Insulin-dependent, last A1c is reasonably well-controlled, considering duration of diabetes I would make her A1c goal 8 or less, 7.1 currently. Return to clinic in 3 months for follow-up diabetes with me.       Meds ordered this encounter  Medications  . DULoxetine (CYMBALTA) 30 MG capsule    Sig: Take 1 capsule (30 mg total) by mouth daily.    Dispense:  90 capsule    Refill:  [place], MD [facility] 02/03/2017, 9:57 AM

## 2017-02-03 NOTE — Patient Instructions (Addendum)
Great to see you!  Make an appointment with Dr. Evette Doffing for a pap smear in 1 month  Come back to see me in 3 months for follow up for diabetes

## 2017-02-17 ENCOUNTER — Encounter: Payer: Self-pay | Admitting: Family Medicine

## 2017-02-17 DIAGNOSIS — E11319 Type 2 diabetes mellitus with unspecified diabetic retinopathy without macular edema: Secondary | ICD-10-CM | POA: Insufficient documentation

## 2017-02-28 ENCOUNTER — Other Ambulatory Visit: Payer: Self-pay | Admitting: Cardiology

## 2017-03-09 ENCOUNTER — Ambulatory Visit (INDEPENDENT_AMBULATORY_CARE_PROVIDER_SITE_OTHER): Payer: Medicaid Other | Admitting: Pediatrics

## 2017-03-09 ENCOUNTER — Encounter: Payer: Self-pay | Admitting: Pediatrics

## 2017-03-09 VITALS — BP 114/69 | HR 67 | Temp 97.7°F | Ht 64.0 in | Wt 153.4 lb

## 2017-03-09 DIAGNOSIS — Z0000 Encounter for general adult medical examination without abnormal findings: Secondary | ICD-10-CM

## 2017-03-09 DIAGNOSIS — Z01419 Encounter for gynecological examination (general) (routine) without abnormal findings: Secondary | ICD-10-CM | POA: Diagnosis not present

## 2017-03-09 DIAGNOSIS — M810 Age-related osteoporosis without current pathological fracture: Secondary | ICD-10-CM

## 2017-03-09 DIAGNOSIS — D485 Neoplasm of uncertain behavior of skin: Secondary | ICD-10-CM

## 2017-03-09 DIAGNOSIS — Z124 Encounter for screening for malignant neoplasm of cervix: Secondary | ICD-10-CM

## 2017-03-09 NOTE — Progress Notes (Signed)
  Subjective:   Patient ID: MARELI ANTUNES, female    DOB: Mar 20, 1951, 66 y.o.   MRN: [id number] CC: annual exam HPI: HANNAH STRADER is a 66 y.o. female presenting for annual exam  Last pap smear 15 years ago Periods stopped at about 66yo  H/o breast cancer in March 09, 2010, s/p treatment, last mammogram in Jan  Colon ca screen: pt thinks 5 yrs by Dr. Sunday Shams, pt says it was fine, no records  Still grieving for husband, died of gastric cancer in 03-10-2015  Daughter lives with her, another lives her  Falls some at home, trips over her dogs some Doesn't think she gets lighteaded, doesn't think the medications are causing side effects, will turn around and not look and trip over dogs On prolia for past 3 years since last dexa  CVA affecting L side, sometimes has food drop  Ate cantelope this morning  Has had large skin tag L buttock for years, not changing  Relevant past medical, surgical, family and social history reviewed. Allergies and medications reviewed and updated. History  Smoking Status  . Never Smoker  Smokeless Tobacco  . Never Used   ROS: All systems negative other than what is in HPI  Objective:    BP 114/69   Pulse 67   Temp 97.7 F (36.5 C) (Oral)   Ht 5\' 4"  (1.626 m)   Wt 153 lb 6.4 oz (69.6 kg)   BMI 26.33 kg/m   Wt Readings from Last 3 Encounters:  03/09/17 153 lb 6.4 oz (69.6 kg)  02/03/17 145 lb (65.8 kg)  01/06/17 145 lb 6.4 oz (66 kg)    Gen: NAD, alert, cooperative with exam, NCAT EYES: EOMI, no conjunctival injection, or no icterus ENT:  TMs pearly gray b/l, OP without erythema LYMPH: no cervical LAD CV: NRRR, normal S1/S2, no murmur, distal pulses 2+ b/l Resp: CTABL, no wheezes, normal WOB Abd: +BS, soft, NTND. no guarding or organomegaly Ext: No edema, warm Neuro: Alert and oriented, strength equal b/l UE and LE, coordination grossly normal MSK: normal muscle bulk Skin: L buttock with pedunculated 1.5cm x 1.5cm soft mass GU: nl external  female genitalia, normal appearing cervix, scant discharge present Breast: nl R breast exam, L breast s/p mastectomy, no lumps/nodules L chest/L axillae  Assessment & Plan:  Michaelia was seen today for annual exam.  Diagnoses and all orders for this visit:  Encounter for preventive health examination UTD colonoscopy -     Hepatitis C antibody  Osteoporosis without current pathological fracture, unspecified osteoporosis type On prolia, not sure of last dose, given at [facility], has closed DEXA due 07/2017, then f/u here for prolia  Screening for cervical cancer No h/o abnormals, not regularly getting screening -     Pap IG and HPV (high risk) DNA detection  Neoplasm of uncertain behavior of skin Pedunculated soft flesh colored mass L buttock, send for pathology -     Pathology   Follow up plan: Return for as scheduled. with PCP Assunta Found, MD [place]

## 2017-03-10 LAB — HEPATITIS C ANTIBODY

## 2017-03-11 LAB — PAP IG AND HPV HIGH-RISK
HPV, high-risk: NEGATIVE
PAP Smear Comment: 0

## 2017-03-14 LAB — PATHOLOGY

## 2017-03-26 NOTE — Progress Notes (Signed)
Patient has an appt for Dexa and appt with Dr. Wendi Snipes

## 2017-03-31 ENCOUNTER — Other Ambulatory Visit: Payer: Self-pay | Admitting: Family Medicine

## 2017-04-07 ENCOUNTER — Other Ambulatory Visit: Payer: Self-pay | Admitting: Family Medicine

## 2017-04-18 ENCOUNTER — Other Ambulatory Visit: Payer: Self-pay | Admitting: Family Medicine

## 2017-05-09 ENCOUNTER — Telehealth: Payer: Self-pay

## 2017-05-09 ENCOUNTER — Encounter: Payer: Self-pay | Admitting: Family Medicine

## 2017-05-09 ENCOUNTER — Ambulatory Visit (INDEPENDENT_AMBULATORY_CARE_PROVIDER_SITE_OTHER): Payer: Medicare Other | Admitting: Family Medicine

## 2017-05-09 VITALS — BP 141/75 | HR 73 | Temp 97.2°F | Ht 64.0 in | Wt 138.4 lb

## 2017-05-09 DIAGNOSIS — Z794 Long term (current) use of insulin: Secondary | ICD-10-CM | POA: Diagnosis not present

## 2017-05-09 DIAGNOSIS — I10 Essential (primary) hypertension: Secondary | ICD-10-CM | POA: Diagnosis not present

## 2017-05-09 DIAGNOSIS — E119 Type 2 diabetes mellitus without complications: Secondary | ICD-10-CM | POA: Diagnosis not present

## 2017-05-09 DIAGNOSIS — E785 Hyperlipidemia, unspecified: Secondary | ICD-10-CM

## 2017-05-09 DIAGNOSIS — IMO0001 Reserved for inherently not codable concepts without codable children: Secondary | ICD-10-CM

## 2017-05-09 LAB — BAYER DCA HB A1C WAIVED: HB A1C: 6.9 % (ref <7.0)

## 2017-05-09 MED ORDER — INSULIN DEGLUDEC 100 UNIT/ML ~~LOC~~ SOPN: 20.0000 [IU] | PEN_INJECTOR | Freq: Every day | SUBCUTANEOUS | 3 refills | Status: DC

## 2017-05-09 NOTE — Patient Instructions (Signed)
Great to see you!  Change to 20 units daily of lantus, when you are ready to change to tresiba you will also use 20 units daily of that.   Come back in 3 months unless you need [place] sooner.   You will hear from [place] about seeing an endocrinologist, they can arrange an insulin pump for you if it is appropriate for you

## 2017-05-09 NOTE — Progress Notes (Signed)
   HPI  Patient presents today here to follow-up for chronic medical conditions. Diabetes Watching diet Average fasting 90-130 She has had hypoglycemia in the 60s around 2 times a week. Postprandial random is 150-200 Using a proximally 5-10 units of NovoLog at most meals, however has a sliding scale.  Hypertension Good medication compliance, no headache.  Hyperlipidemia Watching diet, good medication compliance without side effects.  PMH: Smoking status noted ROS: Per HPI  Objective: BP (!) 141/75   Pulse 73   Temp 97.2 F (36.2 C) (Oral)   Ht '5\' 4"'$  (1.626 m)   Wt 138 lb 6.4 oz (62.8 kg)   BMI 23.76 kg/m  Gen: NAD, alert, cooperative with exam HEENT: NCAT, EOMI, PERRL CV: RRR, good S1/S2, no murmur Resp: CTABL, no wheezes, non-labored Ext: No edema, warm Neuro: Alert and oriented, No gross deficits  Diabetic Foot Exam - Simple   Simple Foot Form Diabetic Foot exam was performed with the following findings:  Yes 05/09/2017  9:38 AM  Visual Inspection No deformities, no ulcerations, no other skin breakdown bilaterally:  Yes See comments:  Yes Sensation Testing See comments:  Yes Pulse Check Posterior Tibialis and Dorsalis pulse intact bilaterally:  Yes Comments Great toenail avulsion bilaterally Sensation to monofilament absent on right heel, otherwise intact      Assessment and plan:  # Insulin-dependent diabetes A1c well controlled Decrease basal insulin to 20 units once daily, changing brands given formulary change. No change to sliding scale Patient requesting insulin pump, refer to endocrinology for their opinion  # Hypertension With history of CAD, continue ACE inhibitor Well-controlled Labs  # Hyperlipidemia With CAD history Continue statin Labs, fasting    Orders Placed This Encounter  Procedures  . Microalbumin / creatinine urine ratio  . Bayer DCA Hb A1c Waived  . Lipid panel  . CMP14+EGFR  . CBC with Differential/Platelet  .  Ambulatory referral to Endocrinology    Referral Priority:   Routine    Referral Type:   Consultation    Referral Reason:   Specialty Services Required    Number of Visits Requested:   1    Meds ordered this encounter  Medications  . insulin degludec (TRESIBA FLEXTOUCH) 100 UNIT/ML SOPN FlexTouch Pen    Sig: Inject 0.2 mLs (20 Units total) into the skin daily at 10 pm.    Dispense:  5 pen    Refill:  3    Please DC lantus, formulary change    Laroy Apple, MD [facility] 05/09/2017, 9:39 AM

## 2017-05-09 NOTE — Telephone Encounter (Signed)
Will be glad to refer, will ask for indication.   Laroy Apple, MD [facility] 05/09/2017, 5:23 PM

## 2017-05-10 LAB — LIPID PANEL
CHOL/HDL RATIO: 2 ratio (ref 0.0–4.4)
Cholesterol, Total: 140 mg/dL (ref 100–199)
HDL: 69 mg/dL (ref >39)
LDL CALC: 64 mg/dL (ref 0–99)
TRIGLYCERIDES: 37 mg/dL (ref 0–149)
VLDL Cholesterol Cal: 7 mg/dL (ref 5–40)

## 2017-05-10 LAB — CMP14+EGFR
A/G RATIO: 1.8 (ref 1.2–2.2)
ALK PHOS: 107 IU/L (ref 39–117)
ALT: 16 IU/L (ref 0–32)
AST: 18 IU/L (ref 0–40)
Albumin: 3.8 g/dL (ref 3.6–4.8)
BUN / CREAT RATIO: 18 (ref 12–28)
BUN: 13 mg/dL (ref 8–27)
Bilirubin Total: 0.2 mg/dL (ref 0.0–1.2)
CALCIUM: 9.7 mg/dL (ref 8.7–10.3)
CO2: 19 mmol/L — ABNORMAL LOW (ref 20–29)
Chloride: 106 mmol/L (ref 96–106)
Creatinine, Ser: 0.73 mg/dL (ref 0.57–1.00)
GFR calc Af Amer: 100 mL/min/{1.73_m2} (ref >59)
GFR, EST NON AFRICAN AMERICAN: 87 mL/min/{1.73_m2} (ref >59)
GLOBULIN, TOTAL: 2.1 g/dL (ref 1.5–4.5)
Glucose: 102 mg/dL — ABNORMAL HIGH (ref 65–99)
Potassium: 4.5 mmol/L (ref 3.5–5.2)
SODIUM: 141 mmol/L (ref 134–144)
Total Protein: 5.9 g/dL — ABNORMAL LOW (ref 6.0–8.5)

## 2017-05-10 LAB — CBC WITH DIFFERENTIAL/PLATELET
BASOS: 0 %
Basophils Absolute: 0 10*3/uL (ref 0.0–0.2)
EOS (ABSOLUTE): 0.1 10*3/uL (ref 0.0–0.4)
Eos: 3 %
Hematocrit: 34.1 % (ref 34.0–46.6)
Hemoglobin: 10.9 g/dL — ABNORMAL LOW (ref 11.1–15.9)
IMMATURE GRANULOCYTES: 0 %
Immature Grans (Abs): 0 10*3/uL (ref 0.0–0.1)
LYMPHS ABS: 1.4 10*3/uL (ref 0.7–3.1)
Lymphs: 30 %
MCH: 28.1 pg (ref 26.6–33.0)
MCHC: 32 g/dL (ref 31.5–35.7)
MCV: 88 fL (ref 79–97)
MONOS ABS: 0.3 10*3/uL (ref 0.1–0.9)
Monocytes: 7 %
NEUTROS PCT: 60 %
Neutrophils Absolute: 2.9 10*3/uL (ref 1.4–7.0)
PLATELETS: 247 10*3/uL (ref 150–379)
RBC: 3.88 x10E6/uL (ref 3.77–5.28)
RDW: 14.7 % (ref 12.3–15.4)
WBC: 4.8 10*3/uL (ref 3.4–10.8)

## 2017-05-10 LAB — MICROALBUMIN / CREATININE URINE RATIO
CREATININE, UR: 95.2 mg/dL
Microalb/Creat Ratio: 9 mg/g creat (ref 0.0–30.0)
Microalbumin, Urine: 8.6 ug/mL

## 2017-05-11 ENCOUNTER — Other Ambulatory Visit: Payer: Self-pay

## 2017-05-11 DIAGNOSIS — E878 Other disorders of electrolyte and fluid balance, not elsewhere classified: Secondary | ICD-10-CM

## 2017-05-20 ENCOUNTER — Other Ambulatory Visit: Payer: Self-pay | Admitting: *Deleted

## 2017-05-20 DIAGNOSIS — M549 Dorsalgia, unspecified: Principal | ICD-10-CM

## 2017-05-20 DIAGNOSIS — G8929 Other chronic pain: Secondary | ICD-10-CM

## 2017-05-20 NOTE — Telephone Encounter (Signed)
Referral to pain clinic made for back pain

## 2017-05-21 ENCOUNTER — Encounter: Payer: Self-pay | Admitting: Emergency Medicine

## 2017-05-21 ENCOUNTER — Emergency Department (HOSPITAL_COMMUNITY)
Admission: EM | Admit: 2017-05-21 | Discharge: 2017-05-21 | Disposition: A | Discharge status: Home / Self Care | Payer: Medicare Other | Attending: Emergency Medicine | Admitting: Emergency Medicine

## 2017-05-21 ENCOUNTER — Emergency Department (HOSPITAL_COMMUNITY): Payer: Medicare Other

## 2017-05-21 ENCOUNTER — Encounter (HOSPITAL_COMMUNITY): Payer: Self-pay | Admitting: Radiology

## 2017-05-21 DIAGNOSIS — R55 Syncope and collapse: Secondary | ICD-10-CM | POA: Insufficient documentation

## 2017-05-21 DIAGNOSIS — R1084 Generalized abdominal pain: Secondary | ICD-10-CM | POA: Diagnosis not present

## 2017-05-21 DIAGNOSIS — K5909 Other constipation: Secondary | ICD-10-CM | POA: Insufficient documentation

## 2017-05-21 LAB — URINALYSIS, ROUTINE W REFLEX MICROSCOPIC
BACTERIA UA: NONE SEEN
BILIRUBIN URINE: NEGATIVE
Glucose, UA: 50 mg/dL — AB
Hgb urine dipstick: NEGATIVE
Ketones, ur: NEGATIVE mg/dL
Nitrite: NEGATIVE
PH: 5 (ref 5.0–8.0)
Protein, ur: NEGATIVE mg/dL
SPECIFIC GRAVITY, URINE: 1.035 — AB (ref 1.005–1.030)

## 2017-05-21 LAB — CBC WITH DIFFERENTIAL/PLATELET
BASOS ABS: 0 10*3/uL (ref 0.0–0.1)
Basophils Relative: 0 %
EOS ABS: 0.2 10*3/uL (ref 0.0–0.7)
EOS PCT: 2 %
HCT: 36.1 % (ref 36.0–46.0)
HEMOGLOBIN: 11.3 g/dL — AB (ref 12.0–15.0)
LYMPHS ABS: 2.1 10*3/uL (ref 0.7–4.0)
Lymphocytes Relative: 26 %
MCH: 28 pg (ref 26.0–34.0)
MCHC: 31.3 g/dL (ref 30.0–36.0)
MCV: 89.6 fL (ref 78.0–100.0)
Monocytes Absolute: 0.2 10*3/uL (ref 0.1–1.0)
Monocytes Relative: 2 %
NEUTROS PCT: 70 %
Neutro Abs: 5.7 10*3/uL (ref 1.7–7.7)
PLATELETS: 220 10*3/uL (ref 150–400)
RBC: 4.03 MIL/uL (ref 3.87–5.11)
RDW: 14.2 % (ref 11.5–15.5)
WBC: 8.2 10*3/uL (ref 4.0–10.5)

## 2017-05-21 LAB — RAPID URINE DRUG SCREEN, HOSP PERFORMED
AMPHETAMINES: NOT DETECTED
BARBITURATES: NOT DETECTED
BENZODIAZEPINES: NOT DETECTED
COCAINE: NOT DETECTED
OPIATES: NOT DETECTED
TETRAHYDROCANNABINOL: NOT DETECTED

## 2017-05-21 LAB — I-STAT CG4 LACTIC ACID, ED: LACTIC ACID, VENOUS: 2.18 mmol/L — AB (ref 0.5–1.9)

## 2017-05-21 LAB — LIPASE, BLOOD: LIPASE: 25 U/L (ref 11–51)

## 2017-05-21 LAB — COMPREHENSIVE METABOLIC PANEL
ALT: 19 U/L (ref 14–54)
AST: 24 U/L (ref 15–41)
Albumin: 3.1 g/dL — ABNORMAL LOW (ref 3.5–5.0)
Alkaline Phosphatase: 100 U/L (ref 38–126)
Anion gap: 7 (ref 5–15)
BILIRUBIN TOTAL: 0.4 mg/dL (ref 0.3–1.2)
BUN: 18 mg/dL (ref 6–20)
CHLORIDE: 112 mmol/L — AB (ref 101–111)
CO2: 21 mmol/L — ABNORMAL LOW (ref 22–32)
CREATININE: 1.01 mg/dL — AB (ref 0.44–1.00)
Calcium: 9.7 mg/dL (ref 8.9–10.3)
GFR, EST NON AFRICAN AMERICAN: 57 mL/min — AB (ref >60)
Glucose, Bld: 225 mg/dL — ABNORMAL HIGH (ref 65–99)
POTASSIUM: 4.6 mmol/L (ref 3.5–5.1)
Sodium: 140 mmol/L (ref 135–145)
TOTAL PROTEIN: 5.6 g/dL — AB (ref 6.5–8.1)

## 2017-05-21 LAB — TYPE AND SCREEN
ABO/RH(D): A POS
Antibody Screen: NEGATIVE

## 2017-05-21 LAB — CBG MONITORING, ED: GLUCOSE-CAPILLARY: 172 mg/dL — AB (ref 65–99)

## 2017-05-21 LAB — SALICYLATE LEVEL: Salicylate Lvl: <7 mg/dL (ref 2.8–30.0)

## 2017-05-21 LAB — ABO/RH: ABO/RH(D): A POS

## 2017-05-21 LAB — TROPONIN I

## 2017-05-21 LAB — ACETAMINOPHEN LEVEL

## 2017-05-21 LAB — ETHANOL: Alcohol, Ethyl (B): <5 mg/dL (ref <5)

## 2017-05-21 MED ORDER — IOPAMIDOL (ISOVUE-300) INJECTION 61%
INTRAVENOUS | Status: AC
  Administered 2017-05-21: 100 mL
  Filled 2017-05-21: qty 100

## 2017-05-21 NOTE — ED Provider Notes (Signed)
[facility] DEPT Provider Note  CSN: [id number] Arrival date & time: 05/21/17  0125 By signing my name below, I, [name], attest that this documentation has been prepared under the direction and in the presence of [name] Fraise, MD . Electronically Signed: Dyke Mccann, Scribe. 05/21/2017. 1:37 AM.   History   Chief Complaint Chief Complaint  Patient presents with  . syncopal episode   LEVEL 5 CAVEAT FOR HPI AND ROS DUE TO ACUITY OF CONDITION   HPI [name] is a 66 y.o. female who presents to the Emergency Department for evaluation s/p witnessed syncopal episode tonight PTA. She has been feeling ill for 2-3 days, and reported abdominal pain and back pain to EMS.  She currently rates her abdominal pain 2/10 in severity and denies any back pain at this time. No fall or trauma. Pt denies any nausea or dysuria.   The history is provided by the EMS personnel. No language interpreter was used.   PMH - CAD/CVA Soc hx - unknown  OB History    No data available      Home Medications    Prior to Admission medications   Not on File    Family History No family history on file.  Social History Social History  Substance Use Topics  . Smoking status: Not on file  . Smokeless tobacco: Not on file  . Alcohol use Not on file     Allergies   Patient has no allergy information on record.   Review of Systems Review of Systems  Unable to perform ROS: Acuity of condition   Physical Exam Updated Vital Signs BP (!) 119/57   Pulse 77   Temp (!) 95.2 F (35.1 C) (Rectal)   Resp 16   Ht 1.626 m (5\' 4" )   Wt 62.6 kg (138 lb)   SpO2 100%   BMI 23.69 kg/m   Physical Exam CONSTITUTIONAL: Ill appearing, diaphoretic HEAD: Normocephalic/atraumatic EYES: EOMI/PERRL ENMT: Mucous membranes dry NECK: supple no meningeal signs SPINE/BACK:entire spine nontender, No bruising/crepitance/stepoffs noted to spine CV: S1/S2 noted, murmur noted LUNGS: Lungs are clear to  auscultation bilaterally, no apparent distress ABDOMEN: soft, mild diffuse tenderness, no rebound or guarding, bowel sounds noted throughout abdomen GU:no cva tenderness NEURO: Pt is awake/alert, moves all extremitiesx4.  No facial droop.  Slow to respond but answers questions appropriately EXTREMITIES: pulses normal/equal, full ROM SKIN: cool to touch Healed left mastectomy scar.  CABG scar noted.   PSYCH: unable to assess  ED Treatments / Results  DIAGNOSTIC STUDIES:  Oxygen Saturation is 67% on RA, low by my interpretation.    COORDINATION OF CARE:  1:35 AM Discussed treatment plan with pt at bedside and pt agreed to plan.   Labs (all labs ordered are listed, but only abnormal results are displayed) Labs Reviewed  COMPREHENSIVE METABOLIC PANEL - Abnormal; Notable for the following:       Result Value   Chloride 112 (*)    CO2 21 (*)    Glucose, Bld 225 (*)    Creatinine, Ser 1.01 (*)    Total Protein 5.6 (*)    Albumin 3.1 (*)    GFR calc non Af Amer 57 (*)    All other components within normal limits  CBC WITH DIFFERENTIAL/PLATELET - Abnormal; Notable for the following:    Hemoglobin 11.3 (*)    All other components within normal limits  ACETAMINOPHEN LEVEL - Abnormal; Notable for the following:    Acetaminophen (Tylenol), Serum <10 (*)  All other components within normal limits  URINALYSIS, ROUTINE W REFLEX MICROSCOPIC - Abnormal; Notable for the following:    APPearance HAZY (*)    Specific Gravity, Urine 1.035 (*)    Glucose, UA 50 (*)    Leukocytes, UA LARGE (*)    Squamous Epithelial / LPF 0-5 (*)    All other components within normal limits  I-STAT CG4 LACTIC ACID, ED - Abnormal; Notable for the following:    Lactic Acid, Venous 2.18 (*)    All other components within normal limits  CBG MONITORING, ED - Abnormal; Notable for the following:    Glucose-Capillary 172 (*)    All other components within normal limits  URINE CULTURE  LIPASE, BLOOD  TROPONIN I   ETHANOL  SALICYLATE LEVEL  RAPID URINE DRUG SCREEN, HOSP PERFORMED  TYPE AND SCREEN  ABO/RH    EKG ED ECG REPORT   Date: 05/21/2017 0131  Rate: 78  Rhythm: normal sinus rhythm  QRS Axis: normal  Intervals: QT prolonged  ST/T Wave abnormalities: nonspecific ST changes  Conduction Disutrbances:right bundle branch block  Narrative Interpretation:   Old EKG Reviewed: unchanged  I have personally reviewed the EKG tracing and agree with the computerized printout as noted.  Radiology Ct Abdomen Pelvis W Contrast  Result Date: 05/21/2017 CLINICAL DATA:  Acute onset of periumbilical abdominal pain, with nausea and vomiting. Initial encounter. EXAM: CT ABDOMEN AND PELVIS WITH CONTRAST TECHNIQUE: Multidetector CT imaging of the abdomen and pelvis was performed using the standard protocol following bolus administration of intravenous contrast. CONTRAST:  175mL ISOVUE-300 IOPAMIDOL (ISOVUE-300) INJECTION 61% COMPARISON:  None. FINDINGS: Lower chest: The patient is status post left-sided mastectomy. Mild left basilar airspace opacity may reflect atelectasis or possibly mild pneumonia. Diffuse coronary artery calcifications are seen. The patient is status post median sternotomy. Hepatobiliary: Numerous hepatic cysts are noted, of varying size. The patient is status post cholecystectomy, with clips noted along the gallbladder fossa. The common bile duct remains normal in caliber, status post cholecystectomy. Pancreas: The pancreas is within normal limits. Spleen: The spleen is unremarkable in appearance. Adrenals/Urinary Tract: The adrenal glands are unremarkable in appearance. Mild bilateral renal scarring is noted, with mild bilateral renal atrophy. Scattered bilateral renal cysts are seen. Scattered calcifications at the renal hila are thought to be vascular in nature. There is no evidence of hydronephrosis. No definite renal or ureteral stones are identified. Stomach/Bowel: The stomach is unremarkable  in appearance. The small bowel is within normal limits. The appendix is normal in caliber, without evidence of appendicitis. A large amount of stool is noted at the descending and sigmoid colon, concerning for constipation. The remainder of the colon is grossly unremarkable. Vascular/Lymphatic: Scattered calcification is seen along the abdominal aorta and its branches. The abdominal aorta is otherwise grossly unremarkable. The inferior vena cava is grossly unremarkable. No retroperitoneal lymphadenopathy is seen. No pelvic sidewall lymphadenopathy is identified. Reproductive: The bladder is mildly distended and grossly unremarkable. The uterus is unremarkable in appearance. The ovaries are grossly symmetric. No suspicious adnexal masses are seen. Other: No additional soft tissue abnormalities are seen. Mild chronic postoperative inflammation is noted at the umbilicus. Musculoskeletal: No acute osseous abnormalities are identified. Vacuum phenomenon is noted at L4-L5, with endplate sclerotic change. The visualized musculature is unremarkable in appearance. IMPRESSION: 1. Large amount of stool at the descending and sigmoid colon, concerning for constipation. Remainder of the colon is grossly unremarkable. 2. Mild left basilar airspace opacity may reflect atelectasis or possibly mild pneumonia.  3. Diffuse coronary artery calcifications seen. 4. Numerous hepatic cysts noted. 5. Mild bilateral renal scarring and mild bilateral renal atrophy. Scattered bilateral renal cysts seen. 6. Scattered aortic atherosclerosis. Electronically Signed   By: Garald Balding M.D.   On: 05/21/2017 03:26   Dg Chest Port 1 View  Result Date: 05/21/2017 CLINICAL DATA:  Weakness.  Syncope. EXAM: PORTABLE CHEST 1 VIEW COMPARISON:  None. FINDINGS: Rotated to the right. Post median sternotomy and CABG. Heart size is upper normal for technique and low lung volumes. Left lung base opacity likely atelectasis. Right lung is clear. No large pleural  effusion. No pneumothorax or pulmonary edema. Bones are under mineralized. IMPRESSION: Low lung volumes with left basilar atelectasis. Electronically Signed   By: Jeb Levering M.D.   On: 05/21/2017 01:53    Procedures Procedures (including critical care time)  Medications Ordered in ED Medications  iopamidol (ISOVUE-300) 61 % injection (100 mLs  Contrast Given 05/21/17 0245)     Initial Impression / Assessment and Plan / ED Course  I have reviewed the triage vital signs and the nursing notes.  Pertinent labs & imaging results that were available during my care of the patient were reviewed by me and considered in my medical decision making (see chart for details).    1:47 AM Pt seen on arrival for syncopal episode Initially EMS reports she was hypotensive and minimally responsive but improved with IV fluids No CPR given Apparently she reported abd pain 2:14 AM Pt improved Awake/alert Vitals appropriate She reports she ate chicken noodle soup and soon after had urge to have BM She went to toilet, felt weak and had syncopal episode No trauma reported Family at bedside confirms story No seizure reported but she did have vomiting She is now having abdominal pain Will proceed with CT imaging of abd/pelvis 4:51 AM Pt improved CT imaging does not reveal emergent process She had large BM after CT scan She ambulated Temperature is improved No vomiting She is requesting d/c home Suspect she had possible vagal reaction while on toilet Previous echo reveals normal EF I doubt cardiac cause of syncope She has no abd pain or other complaints (no cp/cough/sob) Urine culture sent  Will d/c home We discussed strict return precautions Pt/family agreeable BP 118/62   Pulse 66   Temp 98.4 F (36.9 C) (Oral)   Resp 18   Ht 1.626 m (5\' 4" )   Wt 62.6 kg (138 lb)   SpO2 100%   BMI 23.69 kg/m   Final Clinical Impressions(s) / ED Diagnoses   Final diagnoses:  Other constipation   Generalized abdominal pain  Vasovagal syncope    New Prescriptions New Prescriptions   No medications on file   I personally performed the services described in this documentation, which was scribed in my presence. The recorded information has been reviewed and is accurate.       [name] Fraise, MD 05/21/17 [phone]

## 2017-05-21 NOTE — ED Notes (Signed)
States she is feeling much better , states she had a very large bowel movement and now she feels much better.

## 2017-05-21 NOTE — ED Notes (Signed)
BEAR HUGGER PLACED ON PT.

## 2017-05-21 NOTE — ED Notes (Signed)
Swabbed pt's mouth with green oral swab. Transported to CT on monitor.

## 2017-05-21 NOTE — ED Notes (Signed)
Ambulated patient to bathroom, states she feels back to her norm. Daughter at bedside and states patient is acting her norm as well. Steady gait with 1 assist.

## 2017-05-21 NOTE — ED Notes (Signed)
Returned from [place].  Oral temp checked and bear hugger blanket turned off.

## 2017-05-21 NOTE — ED Notes (Signed)
PCXR completed.  Family now at bedside.

## 2017-05-21 NOTE — ED Triage Notes (Signed)
Pt to ED via [facility]> Pt had syncopal episode while at home.  On EMS arrival to pt's house,  pt alert, very diaphoretic, c/o abd pain, back pain, nausea, and vomiting.  No initial radial pulses per EMS.  Pt received NS 800 ml.  On arrival to ED, pt reports only 1--2/10 pain to mid lower abd/pelvis.  Denies back pain.  Denies nausea.  Denies urinary complaints.

## 2017-05-21 NOTE — ED Notes (Signed)
Dr. Christy Gentles at bedside talking with family.  Family reports pt didn't act like she felt good earlier this evening.  Pt states symptoms started after eating chicken noodle soup and she went to bathroom and had severe abd pain and became diaphoretic.  Pt states she feels fine now and is asking to go home.

## 2017-05-21 NOTE — ED Notes (Signed)
Dr Christy Gentles given a copy of lactic acid results 2.18

## 2017-05-22 ENCOUNTER — Encounter: Payer: Self-pay | Admitting: Emergency Medicine

## 2017-05-23 LAB — URINE CULTURE

## 2017-05-23 LAB — HEMOGLOBIN A1C: HEMOGLOBIN A1C: 6.8

## 2017-06-01 ENCOUNTER — Encounter: Payer: Self-pay | Admitting: Cardiology

## 2017-06-01 ENCOUNTER — Ambulatory Visit (INDEPENDENT_AMBULATORY_CARE_PROVIDER_SITE_OTHER): Payer: Medicare Other | Admitting: Cardiology

## 2017-06-01 VITALS — BP 142/72 | HR 73 | Ht 64.0 in | Wt 139.6 lb

## 2017-06-01 DIAGNOSIS — E782 Mixed hyperlipidemia: Secondary | ICD-10-CM | POA: Diagnosis not present

## 2017-06-01 DIAGNOSIS — I10 Essential (primary) hypertension: Secondary | ICD-10-CM | POA: Diagnosis not present

## 2017-06-01 DIAGNOSIS — I2510 Atherosclerotic heart disease of native coronary artery without angina pectoris: Secondary | ICD-10-CM | POA: Diagnosis not present

## 2017-06-01 MED ORDER — LISINOPRIL 10 MG PO TABS: 10.0000 mg | ORAL_TABLET | Freq: Every day | ORAL | 3 refills | Status: DC

## 2017-06-01 NOTE — Patient Instructions (Signed)
Medication Instructions:  Your physician has recommended you make the following change in your medication:  Begin taking Lisinopril 10 mg daily  Labwork: BMET in 2 weeks  Testing/Procedures: NONE  Follow-Up: Your physician wants you to follow-up in: [place] DR. BRANCH You will receive a reminder letter in the mail two months in advance. If you don't receive a letter, please call our office to schedule the follow-up appointment.  Any Other Special Instructions Will Be Listed Below (If Applicable).  If you need a refill on your cardiac medications before your next appointment, please call your pharmacy.

## 2017-06-01 NOTE — Progress Notes (Signed)
Clinical Summary Ms. Kuehnel is a 66 y.o.female seen today for follow up of the following medical problems.   1. CAD - hx of CABG in 2007, normal LVEF 60% - 02/2016 she had nuclear stress without clear ischemia.    - no recent chest pain. No SOB or DOE - compliant with meds  2. HTN - compliant with meds   3. HL - she stopped lipitor due to dizziness.  - last visit started pravastatin 20mg  daily, tolerating well  - lipids 04/2017 TC 140 TG 37 HDL 69 LDL 64  4. Hx of CVA - she is on both ASA and plavix, she is unsure exactly why. Assume its related to her hx of CVA as no clear cardiac indication to be on plavix from records   5. Syncope - seen in ER 05/21/17 - initially low bp's by EMS, resolved with IVFs - elevated Cr and BUN from baseline supporting dehydration. - significant abdominal pain at the time, CT showed severe constipation    SH: husband and a brother passed recently   Past Medical History:  Diagnosis Date  . Anxiety    no rx  . Arthritis   . Cancer [facility])    Left mastectomy, 2/11  . Carotid disease, bilateral ([place])    Followed by Dr. Leonie Man  . Carotid disease, bilateral ([place]) 10/21/2011  . Coronary artery disease    cabg 2007  . Diabetes mellitus   . HLD (hyperlipidemia)   . Hypertension   . Neuromuscular disorder ([place])   . Neuropathic pain, leg   . Stroke [facility])    more than 10 yrs weakness rt leg  . Stroke, lacunar ([place])    Followed by Dr. Leonie Man     No Known Allergies   Current Outpatient Prescriptions  Medication Sig Dispense Refill  . aspirin 81 MG chewable tablet Chew 81 mg by mouth daily.      . clopidogrel (PLAVIX) 75 MG tablet Take 75 mg by mouth daily.     Marland Kitchen denosumab (PROLIA) 60 MG/ML SOLN injection Inject 60 mg into the skin every 6 (six) months. Administer in upper arm, thigh, or abdomen    . DULoxetine (CYMBALTA) 30 MG capsule Take 1 capsule (30 mg total) by mouth daily. 90 capsule 3  . folic acid (FOLVITE) 1 MG tablet  Take 1 mg by mouth daily.      Marland Kitchen gabapentin (NEURONTIN) 400 MG capsule Take 400 mg by mouth 3 (three) times daily.    Marland Kitchen glucose blood (ACCU-CHEK AVIVA PLUS) test strip DX: E11.9. Test qid 100 each 2  . ibuprofen (ADVIL,MOTRIN) 200 MG tablet Take 400 mg by mouth every 8 (eight) hours as needed for headache or moderate pain.    Marland Kitchen insulin aspart (NOVOLOG) 100 UNIT/ML injection Inject 1-15 Units into the skin 3 (three) times daily. PER SLIDING SCALE-PATIENT SCALE    . insulin degludec (TRESIBA FLEXTOUCH) 100 UNIT/ML SOPN FlexTouch Pen Inject 0.2 mLs (20 Units total) into the skin daily at 10 pm. 5 pen 3  . insulin glargine (LANTUS) 100 UNIT/ML injection Inject 22 Units into the skin at bedtime.     Marland Kitchen lisinopril (PRINIVIL,ZESTRIL) 5 MG tablet Take 1 tablet (5 mg total) by mouth daily. 90 tablet 3  . metFORMIN (GLUCOPHAGE) 1000 MG tablet TAKE 1 TABLET BY MOUTH TWICE A DAY 180 tablet 0  . oxyCODONE-acetaminophen (PERCOCET) 10-325 MG tablet Take 1 tablet by mouth 2 (two) times daily.     . pravastatin (PRAVACHOL)  20 MG tablet TAKE 1 TABLET BY MOUTH EVERY EVENING 90 tablet 0  . XTAMPZA ER 18 MG C12A Take 1 capsule by mouth 2 (two) times daily.  0   No current facility-administered medications for this visit.      Past Surgical History:  Procedure Laterality Date  . arm fx Left 11  . CORONARY ARTERY BYPASS GRAFT  07  . FRACTURE SURGERY    . INSERTION OF MESH N/A 04/05/2016   Procedure: INSERTION OF MESH;  Surgeon: Rolm Bookbinder, MD;  Location: [place];  Service: General;  Laterality: N/A;  . MASTECTOMY  2010   lt-nodes  . ORIF ULNAR FRACTURE  10/01/2011   Procedure: OPEN REDUCTION INTERNAL FIXATION (ORIF) ULNAR FRACTURE;  Surgeon: Kerin Salen;  Location: [place];  Service: Orthopedics;  Laterality: Right;  and orif scaphoid  . UMBILICAL HERNIA REPAIR  04/05/2016   with 6.4 cm ventrallex mesh  . UMBILICAL HERNIA REPAIR N/A 04/05/2016   Procedure: UMBILICAL HERNIA REPAIR;  Surgeon: Rolm Bookbinder, MD;  Location: [place];  Service: General;  Laterality: N/A;     No Known Allergies    Family History  Problem Relation Age of Onset  . Cancer Mother        breast   . Stroke Mother   . Diabetes Mother   . Heart attack Father   . Diabetes Sister   . Lung cancer Brother   . Heart disease Brother      Social History Ms. Popovich reports that she has never smoked. She has never used smokeless tobacco. Ms. Vig reports that she does not drink alcohol.   Review of Systems CONSTITUTIONAL: No weight loss, fever, chills, weakness or fatigue.  HEENT: Eyes: No visual loss, blurred vision, double vision or yellow sclerae.No hearing loss, sneezing, congestion, runny nose or sore throat.  SKIN: No rash or itching.  CARDIOVASCULAR: per hpi RESPIRATORY: No shortness of breath, cough or sputum.  GASTROINTESTINAL: No anorexia, nausea, vomiting or diarrhea. No abdominal pain or blood.  GENITOURINARY: No burning on urination, no polyuria NEUROLOGICAL: No headache, dizziness, syncope, paralysis, ataxia, numbness or tingling in the extremities. No change in bowel or bladder control.  MUSCULOSKELETAL: No muscle, back pain, joint pain or stiffness.  LYMPHATICS: No enlarged nodes. No history of splenectomy.  PSYCHIATRIC: No history of depression or anxiety.  ENDOCRINOLOGIC: No reports of sweating, cold or heat intolerance. No polyuria or polydipsia.  Marland Kitchen   Physical Examination Vitals:   06/01/17 0945  BP: (!) 142/72  Pulse: 73   Vitals:   06/01/17 0945  Weight: 139 lb 9.6 oz (63.3 kg)  Height: 5\' 4"  (1.626 m)    Gen: resting comfortably, no acute distress HEENT: no scleral icterus, pupils equal round and reactive, no palptable cervical adenopathy,  CV: RRR, no m/r/g, no jvd Resp: Clear to auscultation bilaterally GI: abdomen is soft, non-tender, non-distended, normal bowel sounds, no hepatosplenomegaly MSK: extremities are warm, no edema.  Skin: warm, no rash Neuro:  no  focal deficits Psych: appropriate affect   Diagnostic Studies 12/2014 echo Study Conclusions  - Left ventricle: The cavity size was normal. Systolic function was normal. The estimated ejection fraction was in the range of 50% to 55%. Very mild concentric hypertrophy. Posterior wall measurement is overestimated. Wall motion was normal; there were no regional wall motion abnormalities. Doppler parameters are consistent with abnormal left ventricular relaxation (grade 1 diastolic dysfunction). Doppler parameters are consistent with high ventricular filling pressure. - Mitral valve: There was  mild regurgitation. - Left atrium: The atrium was mildly dilated. Volume/bsa, S: 30.5 ml/m^2. - Right ventricle: Systolic function was mildly reduced. - Tricuspid valve: There was mild regurgitation.   05/2016 carotid US Elevated velocities RICA likely due to tortous vessel.    02/2016 Nuclear stress  There was no ST segment deviation noted during stress.  Findings consistent with prior small inferoseptal myocardial infarction with no current peri-infarct ischemia.  This is a low risk study. There is no myocardium currently at jeopardy.  The left ventricular ejection fraction is hyperdynamic (>65%).    Assessment and Plan  1. CAD - no symptoms, contniue current meds   2. HTN - elevated in clinic, increase lisionpril to 10mg  daily. Check BMET in 2 weeks  3. HL -did not tolerate high dose statin, now on pravastatin 20mg  daily - continue current statin  4. History of CVA - continue secondary prevention   . F/u 6 months      Arnoldo Lenis, M.D.

## 2017-06-07 ENCOUNTER — Other Ambulatory Visit: Payer: Self-pay | Admitting: Cardiology

## 2017-06-07 ENCOUNTER — Other Ambulatory Visit: Payer: Medicaid Other

## 2017-06-07 DIAGNOSIS — E878 Other disorders of electrolyte and fluid balance, not elsewhere classified: Secondary | ICD-10-CM

## 2017-06-08 ENCOUNTER — Telehealth: Payer: Self-pay | Admitting: Family Medicine

## 2017-06-08 LAB — BMP8+EGFR
BUN / CREAT RATIO: 17 (ref 12–28)
BUN: 11 mg/dL (ref 8–27)
CHLORIDE: 105 mmol/L (ref 96–106)
CO2: 22 mmol/L (ref 20–29)
Calcium: 10 mg/dL (ref 8.7–10.3)
Creatinine, Ser: 0.66 mg/dL (ref 0.57–1.00)
GFR calc Af Amer: 107 mL/min/{1.73_m2} (ref >59)
GFR calc non Af Amer: 93 mL/min/{1.73_m2} (ref >59)
GLUCOSE: 150 mg/dL — AB (ref 65–99)
Potassium: 4.5 mmol/L (ref 3.5–5.2)
Sodium: 143 mmol/L (ref 134–144)

## 2017-06-21 ENCOUNTER — Telehealth: Payer: Self-pay | Admitting: *Deleted

## 2017-06-21 NOTE — Telephone Encounter (Signed)
-----   Message from Lendon Colonel, NP sent at 06/21/2017  9:42 AM EDT ----- Labs reviewed. Essentially normal. No changes in regimen.

## 2017-06-21 NOTE — Telephone Encounter (Signed)
Called patient with test results. No answer. Unable to leave msg.  

## 2017-06-23 ENCOUNTER — Ambulatory Visit (INDEPENDENT_AMBULATORY_CARE_PROVIDER_SITE_OTHER): Payer: Medicare Other | Admitting: "Endocrinology

## 2017-06-23 ENCOUNTER — Encounter: Payer: Self-pay | Admitting: "Endocrinology

## 2017-06-23 VITALS — BP 115/67 | HR 67 | Ht 64.0 in | Wt 131.0 lb

## 2017-06-23 DIAGNOSIS — E782 Mixed hyperlipidemia: Secondary | ICD-10-CM | POA: Diagnosis not present

## 2017-06-23 DIAGNOSIS — I10 Essential (primary) hypertension: Secondary | ICD-10-CM | POA: Diagnosis not present

## 2017-06-23 DIAGNOSIS — E1159 Type 2 diabetes mellitus with other circulatory complications: Secondary | ICD-10-CM

## 2017-06-23 NOTE — Progress Notes (Signed)
Subjective:    Patient ID: [name], female    DOB: 11-14-1951, PCP Timmothy Euler, MD   Past Medical History:  Diagnosis Date  . Anxiety    no rx  . Arthritis   . Cancer [facility])    Left mastectomy, 2/11  . Carotid disease, bilateral ([place])    Followed by Dr. Leonie Man  . Carotid disease, bilateral ([place]) 10/21/2011  . Coronary artery disease    cabg 2007  . Diabetes mellitus   . HLD (hyperlipidemia)   . Hypertension   . Neuromuscular disorder ([place])   . Neuropathic pain, leg   . Stroke [facility])    more than 10 yrs weakness rt leg  . Stroke, lacunar ([place])    Followed by Dr. Leonie Man   Past Surgical History:  Procedure Laterality Date  . arm fx Left 11  . CORONARY ARTERY BYPASS GRAFT  07  . FRACTURE SURGERY    . INSERTION OF MESH N/A 04/05/2016   Procedure: INSERTION OF MESH;  Surgeon: Rolm Bookbinder, MD;  Location: [place];  Service: General;  Laterality: N/A;  . MASTECTOMY  2010   lt-nodes  . ORIF ULNAR FRACTURE  10/01/2011   Procedure: OPEN REDUCTION INTERNAL FIXATION (ORIF) ULNAR FRACTURE;  Surgeon: Kerin Salen;  Location: [place];  Service: Orthopedics;  Laterality: Right;  and orif scaphoid  . UMBILICAL HERNIA REPAIR  04/05/2016   with 6.4 cm ventrallex mesh  . UMBILICAL HERNIA REPAIR N/A 04/05/2016   Procedure: UMBILICAL HERNIA REPAIR;  Surgeon: Rolm Bookbinder, MD;  Location: [place];  Service: General;  Laterality: N/A;   Social History   Social History  . Marital status: Widowed    Spouse name: N/A  . Number of children: N/A  . Years of education: N/A   Social History Main Topics  . Smoking status: Never Smoker  . Smokeless tobacco: Never Used  . Alcohol use No  . Drug use: No  . Sexual activity: Not Asked   Other Topics Concern  . None   Social History Narrative   ** Merged History Encounter **       Outpatient Encounter Prescriptions as of 06/23/2017  Medication Sig  . aspirin 81 MG chewable tablet Chew 81 mg by mouth daily.    . clopidogrel  (PLAVIX) 75 MG tablet Take 75 mg by mouth daily.   Marland Kitchen denosumab (PROLIA) 60 MG/ML SOLN injection Inject 60 mg into the skin every 6 (six) months. Administer in upper arm, thigh, or abdomen  . DULoxetine (CYMBALTA) 30 MG capsule Take 1 capsule (30 mg total) by mouth daily.  . folic acid (FOLVITE) 1 MG tablet Take 1 mg by mouth daily.    Marland Kitchen gabapentin (NEURONTIN) 400 MG capsule Take 400 mg by mouth 3 (three) times daily.  Marland Kitchen glucose blood (ACCU-CHEK AVIVA PLUS) test strip DX: E11.9. Test qid  . ibuprofen (ADVIL,MOTRIN) 200 MG tablet Take 400 mg by mouth every 8 (eight) hours as needed for headache or moderate pain.  Marland Kitchen insulin degludec (TRESIBA FLEXTOUCH) 100 UNIT/ML SOPN FlexTouch Pen Inject 0.2 mLs (20 Units total) into the skin daily at 10 pm.  . lisinopril (PRINIVIL,ZESTRIL) 10 MG tablet Take 1 tablet (10 mg total) by mouth daily.  . metFORMIN (GLUCOPHAGE) 500 MG tablet Take 500 mg by mouth 2 (two) times daily after a meal.  . oxyCODONE-acetaminophen (PERCOCET) 10-325 MG tablet Take 1 tablet by mouth 2 (two) times daily.   . pravastatin (PRAVACHOL) 20 MG tablet TAKE 1  TABLET BY MOUTH EVERY EVENING  . XTAMPZA ER 18 MG C12A Take 1 capsule by mouth 2 (two) times daily.  . [DISCONTINUED] insulin aspart (NOVOLOG) 100 UNIT/ML injection Inject 1-15 Units into the skin 3 (three) times daily. PER SLIDING SCALE-PATIENT SCALE  . [DISCONTINUED] metFORMIN (GLUCOPHAGE) 1000 MG tablet TAKE 1 TABLET BY MOUTH TWICE A DAY   No facility-administered encounter medications on file as of 06/23/2017.    ALLERGIES: No Known Allergies VACCINATION STATUS:  There is no immunization history on file for this patient.  Diabetes  She presents for her initial diabetic visit. She has type 2 diabetes mellitus. Onset time: She was diagnosed at approximate age of 32 years preceded by an episode of gestational diabetes. Her disease course has been worsening. Hypoglycemia symptoms include confusion, headaches and sweats. Pertinent  negatives for hypoglycemia include no pallor or seizures. There are no diabetic associated symptoms. Pertinent negatives for diabetes include no chest pain, no polydipsia, no polyphagia and no polyuria. There are no hypoglycemic complications. Symptoms are worsening. Diabetic complications include a CVA, heart disease and peripheral neuropathy. Risk factors for coronary artery disease include dyslipidemia, diabetes mellitus, hypertension, sedentary lifestyle and family history. Current diabetic treatment includes insulin injections and oral agent (monotherapy). She is compliant with treatment some of the time. Her weight is decreasing steadily. She is following a generally unhealthy diet. When asked about meal planning, she reported none. She has not had a previous visit with a dietitian. She participates in exercise intermittently. (She did not bring any meter nor logs to review today. She admits that she has fluctuating blood glucose readings including hypoglycemia, however she does not monitor her glucose on a regular basis.) An ACE inhibitor/angiotensin II receptor blocker is being taken. Eye exam is current.  Hyperlipidemia  This is a chronic problem. The current episode started more than 1 year ago. The problem is controlled. Pertinent negatives include no chest pain, myalgias or shortness of breath. Current antihyperlipidemic treatment includes statins. Risk factors for coronary artery disease include dyslipidemia, diabetes mellitus, hypertension and a sedentary lifestyle.  Hypertension  This is a chronic problem. The current episode started more than 1 year ago. The problem is controlled. Associated symptoms include headaches and sweats. Pertinent negatives include no chest pain, palpitations or shortness of breath. Past treatments include ACE inhibitors. Hypertensive end-organ damage includes CVA.     Review of Systems  Constitutional: Negative for chills, fever and unexpected weight change.   HENT: Negative for trouble swallowing and voice change.   Eyes: Negative for visual disturbance.  Respiratory: Negative for cough, shortness of breath and wheezing.   Cardiovascular: Negative for chest pain, palpitations and leg swelling.  Gastrointestinal: Negative for diarrhea, nausea and vomiting.  Endocrine: Negative for cold intolerance, heat intolerance, polydipsia, polyphagia and polyuria.  Musculoskeletal: Negative for arthralgias and myalgias.  Skin: Negative for color change, pallor, rash and wound.  Neurological: Positive for headaches. Negative for seizures.  Psychiatric/Behavioral: Positive for confusion. Negative for suicidal ideas.    Objective:    BP 115/67   Pulse 67   Ht 5\' 4"  (1.626 m)   Wt 131 lb (59.4 kg)   SpO2 97%   BMI 22.49 kg/m   Wt Readings from Last 3 Encounters:  06/23/17 131 lb (59.4 kg)  06/01/17 139 lb 9.6 oz (63.3 kg)  05/21/17 138 lb (62.6 kg)    Physical Exam  Constitutional: She is oriented to person, place, and time. She appears well-developed.  HENT:  Head: Normocephalic and  atraumatic.  Eyes: EOM are normal.  Neck: Normal range of motion. Neck supple. No tracheal deviation present. No thyromegaly present.  Cardiovascular: Normal rate and regular rhythm.   Pulmonary/Chest: Effort normal and breath sounds normal.  Abdominal: Soft. Bowel sounds are normal. There is no tenderness. There is no guarding.  Musculoskeletal: Normal range of motion. She exhibits no edema.  Neurological: She is alert and oriented to person, place, and time. She has normal reflexes. No cranial nerve deficit. Coordination normal.  Motor function on left upper and lower extremities are 3 out of 5.  Skin: Skin is warm and dry. No rash noted. No erythema. No pallor.  Psychiatric: She has a normal mood and affect. Judgment normal.     CMP ( most recent) CMP     Component Value Date/Time   NA 143 06/07/2017 1131   K 4.5 06/07/2017 1131   CL 105 06/07/2017 1131    CO2 22 06/07/2017 1131   GLUCOSE 150 (H) 06/07/2017 1131   GLUCOSE 225 (H) 05/21/2017 0135   BUN 11 06/07/2017 1131   CREATININE 0.66 06/07/2017 1131   CALCIUM 10.0 06/07/2017 1131   PROT 5.6 (L) 05/21/2017 0135   PROT 5.9 (L) 05/09/2017 0935   ALBUMIN 3.1 (L) 05/21/2017 0135   ALBUMIN 3.8 05/09/2017 0935   AST 24 05/21/2017 0135   ALT 19 05/21/2017 0135   ALKPHOS 100 05/21/2017 0135   BILITOT 0.4 05/21/2017 0135   BILITOT 0.2 05/09/2017 0935   GFRNONAA 93 06/07/2017 1131   GFRAA 107 06/07/2017 1131     Diabetic Labs (most recent): Lab Results  Component Value Date   HGBA1C 6.8 05/23/2017   HGBA1C 7.8 (H) 03/26/2016     Lipid Panel ( most recent) Lipid Panel     Component Value Date/Time   CHOL 140 05/09/2017 0935   TRIG 37 05/09/2017 0935   HDL 69 05/09/2017 0935   CHOLHDL 2.0 05/09/2017 0935   LDLCALC 64 05/09/2017 0935      Assessment & Plan:   1. DM type 2 causing vascular disease ([place])  Her diabetes is  complicated by coronary artery disease which required triple bypass surgery in 2007, CVA with left-sided residual thyroid cysts, retinopathy, peripheral arterial disease, peripheral neuropathy and she remains at a high risk for more acute and chronic complications of diabetes which include CAD, CVA, CKD, retinopathy, and neuropathy. These are all discussed in detail with the patient.  Patient came with no meter nor glucose profile, and  recent A1c 6.8 % verbally reported by her daughter from last month.  Recent labs reviewed.   - I have re-counseled the patient on diet management and  by adopting a carbohydrate restricted / protein rich  Diet.  - Suggestion is made for patient to avoid simple carbohydrates   from their diet including Cakes , Desserts, Ice Cream,  Soda (  diet and regular) , Sweet Tea , Candies,  Chips, Cookies, Artificial Sweeteners,   and "Sugar-free" Products .  This will help patient to have stable blood glucose profile and potentially  avoid unintended  Weight gain.  - Patient is advised to stick to a routine mealtimes to eat 3 meals  a day and avoid unnecessary snacks ( to snack only to correct hypoglycemia).  - The patient  will be  scheduled with Jearld Fenton, RDN, CDE for individualized DM education.  - I have approached patient with the following individualized plan to manage diabetes and patient agrees.  - She reports several  episodes of random hypoglycemia. The main goal of therapy in her case would be to avoid hypoglycemia. - I will proceed only with basal insulin  Tresiba 15 units daily at bedtime,  patient with  strict monitoring of glucose   4 times a day-before meals and at bedtime.  - I advised her to hold NovoLog for now. - Patient is warned not to take insulin without proper monitoring per orders.  -Patient is encouraged to call clinic for blood glucose levels less than 70 or above 300 mg /dl. - I will lower metformin to 500 mg by mouth twice a day, therapeutically suitable for patient. - She is not a suitable candidate for SGLT2 inhibitors nor for incretin therapy at this time. - Patient specific target  for A1c; LDL, HDL, Triglycerides, and  Waist Circumference were discussed in detail.  2) BP/HTN:  controlled. Continue current medications including ACEI/ARB. 3) Lipids/HPL:controlled with LDL 64.  Patient is advised to continue statins. 4)  Weight/Diet: CDE consult in progress, exercise, and carbohydrates information provided.  5) Chronic Care/Health Maintenance:  -Patient is on ACEI/ARB and Statin medications and encouraged to continue to follow up with Ophthalmology, Podiatrist at least yearly or according to recommendations, and advised to  stay away from smoking. I have recommended yearly flu vaccine and pneumonia vaccination at least every 5 years; moderate intensity exercise for up to 150 minutes weekly; and  sleep for at least 7 hours a day.  - 45 minutes of time was spent on the care of this  patient , 50% of which was applied for counseling on diabetes complications and their preventions.  - I advised patient to maintain close follow up with Timmothy Euler, MD for primary care needs.  Patient is asked to bring meter and  blood glucose logs during her next visit.   Follow up plan: -Return in about 2 weeks (around 07/07/2017) for follow up with meter and logs- no labs.  Glade Lloyd, MD Phone: [phone]  Fax: [phone]   06/23/2017, 4:45 PM

## 2017-06-30 ENCOUNTER — Other Ambulatory Visit: Payer: Self-pay | Admitting: Family Medicine

## 2017-07-07 ENCOUNTER — Ambulatory Visit: Payer: Medicaid Other | Admitting: "Endocrinology

## 2017-07-20 ENCOUNTER — Telehealth: Payer: Self-pay | Admitting: Family Medicine

## 2017-07-20 ENCOUNTER — Other Ambulatory Visit: Payer: Medicaid Other

## 2017-07-28 NOTE — Telephone Encounter (Signed)
Letter sent with appointment date/time-CLC 

## 2017-08-09 ENCOUNTER — Ambulatory Visit (INDEPENDENT_AMBULATORY_CARE_PROVIDER_SITE_OTHER): Payer: Medicare Other | Admitting: Family Medicine

## 2017-08-09 ENCOUNTER — Encounter: Payer: Self-pay | Admitting: Family Medicine

## 2017-08-09 ENCOUNTER — Ambulatory Visit (INDEPENDENT_AMBULATORY_CARE_PROVIDER_SITE_OTHER): Payer: Medicare Other

## 2017-08-09 ENCOUNTER — Other Ambulatory Visit: Payer: Medicare Other

## 2017-08-09 VITALS — BP 128/64 | HR 64 | Temp 97.2°F | Ht 64.0 in | Wt 139.6 lb

## 2017-08-09 DIAGNOSIS — I2510 Atherosclerotic heart disease of native coronary artery without angina pectoris: Secondary | ICD-10-CM

## 2017-08-09 DIAGNOSIS — E1159 Type 2 diabetes mellitus with other circulatory complications: Secondary | ICD-10-CM

## 2017-08-09 DIAGNOSIS — M810 Age-related osteoporosis without current pathological fracture: Secondary | ICD-10-CM | POA: Diagnosis not present

## 2017-08-09 DIAGNOSIS — K5903 Drug induced constipation: Secondary | ICD-10-CM

## 2017-08-09 DIAGNOSIS — T402X5A Adverse effect of other opioids, initial encounter: Secondary | ICD-10-CM | POA: Diagnosis not present

## 2017-08-09 MED ORDER — NALOXEGOL OXALATE 25 MG PO TABS: 25.0000 mg | ORAL_TABLET | Freq: Every day | ORAL | 3 refills | Status: DC

## 2017-08-09 MED ORDER — LINACLOTIDE 145 MCG PO CAPS
145.0000 ug | ORAL_CAPSULE | Freq: Every day | ORAL | 2 refills | Status: DC
Start: 2017-08-09 — End: 2017-08-09

## 2017-08-09 MED ORDER — LINACLOTIDE 145 MCG PO CAPS: 145.0000 ug | ORAL_CAPSULE | Freq: Every day | ORAL | 2 refills | Status: DC

## 2017-08-09 NOTE — Progress Notes (Addendum)
   HPI  Patient presents today for follow-up of chronic medical conditions.  Osteoporosis Previously treated with prolia, has not had an injection in one year.  Diabetes Patient established with endocrinology, she was considering getting an insulin pump, she is no longer interested and would like to follow-up here. Good medication compliance. Occasional blood sugars in the 50s and 70s in the a.m.  Pt  taking chronic narcotics prescribed by pain management. She does complain of frequent constipation, however does not believe this is associated with her narcotics. She has been told this by her pain doctor.   PMH: Smoking status noted ROS: Per HPI  Objective: BP 128/64   Pulse 64   Temp (!) 97.2 F (36.2 C) (Oral)   Ht 5\' 4"  (1.626 m)   Wt 139 lb 9.6 oz (63.3 kg)   BMI 23.96 kg/m  Gen: NAD, alert, cooperative with exam HEENT: NCAT CV: RRR, good S1/S2, no murmur Resp: CTABL, no wheezes, non-labored Ext: No edema, warm Neuro: Alert and oriented, No gross deficits  Assessment and plan:  # Type 2 diabetes Previously her A1c has been very well controlled No changes for now, we will likely have to decrease her basal insulin if she continues to have low sugars in the 70s in the morning. She has rare hypoglycemia and has good awareness.   # Opioid-induced constipation Trial of movantik  # Osteoporosis Historical diagnosis, patient reports previously being treated with prolia Dexa Will help with prolia prescribing/delivery  Meds ordered this encounter  Medications  . DISCONTD: linaclotide (LINZESS) 145 MCG CAPS capsule    Sig: Take 1 capsule (145 mcg total) by mouth daily before breakfast.    Dispense:  30 capsule    Refill:  2  . DISCONTD: naloxegol oxalate (MOVANTIK) 25 MG TABS tablet    Sig: Take 1 tablet (25 mg total) by mouth daily.    Dispense:  30 tablet    Refill:  3    Please dc linzess- will try this instead  . linaclotide (LINZESS) 145 MCG CAPS capsule   Sig: Take 1 capsule (145 mcg total) by mouth daily before breakfast.    Dispense:  30 capsule    Refill:  2  . naloxegol oxalate (MOVANTIK) 25 MG TABS tablet    Sig: Take 1 tablet (25 mg total) by mouth daily.    Dispense:  30 tablet    Refill:  3    Please dc linzess- will try this instead    Laroy Apple, MD [facility] 08/09/2017, 11:42 AM

## 2017-08-09 NOTE — Patient Instructions (Addendum)
Great to see you!  Come back in 1 month for follow up diabetes  Check on your prolia, we are glad to help if needed  Try movantik for your constipation, 1 pill once daily.

## 2017-09-10 ENCOUNTER — Other Ambulatory Visit: Payer: Self-pay | Admitting: Family Medicine

## 2017-09-14 NOTE — Patient Instructions (Signed)
Your procedure is scheduled on: 09/26/2017   Report to [facility] at 1130   AM.  Call this number if you have problems the morning of surgery: [phone]   Do not eat food or drink liquids :After Midnight.      Take these medicines the morning of surgery with A SIP OF WATER: cymbalta, neurontin, lisinopril, movantik, oxycodone. See enclsed information about diabetic medications.   Do not wear jewelry, make-up or nail polish.  Do not wear lotions, powders, or perfumes. You may wear deodorant.  Do not shave 48 hours prior to surgery.  Do not bring valuables to the hospital.  Contacts, dentures or bridgework may not be worn into surgery.  Leave suitcase in the car. After surgery it may be brought to your room.  For patients admitted to the hospital, checkout time is 11:00 AM the day of discharge.   Patients discharged the day of surgery will not be allowed to drive home.  :     Please read over the following fact sheets that you were given: Coughing and Deep Breathing, Surgical Site Infection Prevention, Anesthesia Post-op Instructions and Care and Recovery After Surgery    Cataract A cataract is a clouding of the lens of the eye. When a lens becomes cloudy, vision is reduced based on the degree and nature of the clouding. Many cataracts reduce vision to some degree. Some cataracts make people more near-sighted as they develop. Other cataracts increase glare. Cataracts that are ignored and become worse can sometimes look white. The white color can be seen through the pupil. CAUSES   Aging. However, cataracts may occur at any age, even in newborns.   Certain drugs.   Trauma to the eye.   Certain diseases such as diabetes.   Specific eye diseases such as chronic inflammation inside the eye or a sudden attack of a rare form of glaucoma.   Inherited or acquired medical problems.  SYMPTOMS   Gradual, progressive drop in vision in the affected eye.   Severe, rapid visual loss. This  most often happens when trauma is the cause.  DIAGNOSIS  To detect a cataract, an eye doctor examines the lens. Cataracts are best diagnosed with an exam of the eyes with the pupils enlarged (dilated) by drops.  TREATMENT  For an early cataract, vision may improve by using different eyeglasses or stronger lighting. If that does not help your vision, surgery is the only effective treatment. A cataract needs to be surgically removed when vision loss interferes with your everyday activities, such as driving, reading, or watching TV. A cataract may also have to be removed if it prevents examination or treatment of another eye problem. Surgery removes the cloudy lens and usually replaces it with a substitute lens (intraocular lens, IOL).  At a time when both you and your doctor agree, the cataract will be surgically removed. If you have cataracts in both eyes, only one is usually removed at a time. This allows the operated eye to heal and be out of danger from any possible problems after surgery (such as infection or poor wound healing). In rare cases, a cataract may be doing damage to your eye. In these cases, your caregiver may advise surgical removal right away. The vast majority of people who have cataract surgery have better vision afterward. HOME CARE INSTRUCTIONS  If you are not planning surgery, you may be asked to do the following:  Use different eyeglasses.   Use stronger or brighter lighting.  Ask your eye doctor about reducing your medicine dose or changing medicines if it is thought that a medicine caused your cataract. Changing medicines does not make the cataract go away on its own.   Become familiar with your surroundings. Poor vision can lead to injury. Avoid bumping into things on the affected side. You are at a higher risk for tripping or falling.   Exercise extreme care when driving or operating machinery.   Wear sunglasses if you are sensitive to bright light or experiencing  problems with glare.  SEEK IMMEDIATE MEDICAL CARE IF:   You have a worsening or sudden vision loss.   You notice redness, swelling, or increasing pain in the eye.   You have a fever.  Document Released: 11/01/2005 Document Revised: 10/21/2011 Document Reviewed: 06/25/2011 [facility] Patient Information 2012 [place].PATIENT INSTRUCTIONS POST-ANESTHESIA  IMMEDIATELY FOLLOWING SURGERY:  Do not drive or operate machinery for the first twenty four hours after surgery.  Do not make any important decisions for twenty four hours after surgery or while taking narcotic pain medications or sedatives.  If you develop intractable nausea and vomiting or a severe headache please notify your doctor immediately.  FOLLOW-UP:  Please make an appointment with your surgeon as instructed. You do not need to follow up with anesthesia unless specifically instructed to do so.  WOUND CARE INSTRUCTIONS (if applicable):  Keep a dry clean dressing on the anesthesia/puncture wound site if there is drainage.  Once the wound has quit draining you may leave it open to air.  Generally you should leave the bandage intact for twenty four hours unless there is drainage.  If the epidural site drains for more than 36-48 hours please call the anesthesia department.  QUESTIONS?:  Please feel free to call your physician or the hospital operator if you have any questions, and they will be happy to assist you.

## 2017-09-19 ENCOUNTER — Telehealth: Payer: Self-pay | Admitting: Family Medicine

## 2017-09-19 ENCOUNTER — Other Ambulatory Visit: Payer: Self-pay | Admitting: Family Medicine

## 2017-09-19 NOTE — Telephone Encounter (Signed)
Spoke with pt's daughter regarding constipation Offered appt appt declined Will try OTC Will call back to schedule

## 2017-09-20 ENCOUNTER — Encounter (HOSPITAL_COMMUNITY)
Admission: RE | Admit: 2017-09-20 | Discharge: 2017-09-20 | Disposition: A | Discharge status: Home / Self Care | Payer: Medicare Other | Source: Ambulatory Visit | Attending: Ophthalmology | Admitting: Ophthalmology

## 2017-09-20 ENCOUNTER — Other Ambulatory Visit: Payer: Self-pay

## 2017-09-20 ENCOUNTER — Encounter (HOSPITAL_COMMUNITY): Payer: Self-pay

## 2017-09-20 DIAGNOSIS — Z01812 Encounter for preprocedural laboratory examination: Secondary | ICD-10-CM | POA: Diagnosis present

## 2017-09-20 DIAGNOSIS — H269 Unspecified cataract: Secondary | ICD-10-CM | POA: Diagnosis not present

## 2017-09-20 LAB — CBC WITH DIFFERENTIAL/PLATELET
BASOS PCT: 1 %
Basophils Absolute: 0 10*3/uL (ref 0.0–0.1)
EOS ABS: 0.1 10*3/uL (ref 0.0–0.7)
EOS PCT: 2 %
HCT: 35.6 % — ABNORMAL LOW (ref 36.0–46.0)
HEMOGLOBIN: 11.5 g/dL — AB (ref 12.0–15.0)
LYMPHS PCT: 35 %
Lymphs Abs: 1.5 10*3/uL (ref 0.7–4.0)
MCH: 29 pg (ref 26.0–34.0)
MCHC: 32.3 g/dL (ref 30.0–36.0)
MCV: 89.9 fL (ref 78.0–100.0)
Monocytes Absolute: 0.3 10*3/uL (ref 0.1–1.0)
Monocytes Relative: 8 %
NEUTROS ABS: 2.4 10*3/uL (ref 1.7–7.7)
NEUTROS PCT: 54 %
PLATELETS: 241 10*3/uL (ref 150–400)
RBC: 3.96 MIL/uL (ref 3.87–5.11)
RDW: 13.8 % (ref 11.5–15.5)
WBC: 4.4 10*3/uL (ref 4.0–10.5)

## 2017-09-20 LAB — BASIC METABOLIC PANEL
Anion gap: 9 (ref 5–15)
BUN: 14 mg/dL (ref 6–20)
CHLORIDE: 106 mmol/L (ref 101–111)
CO2: 23 mmol/L (ref 22–32)
Calcium: 9.9 mg/dL (ref 8.9–10.3)
Creatinine, Ser: 0.72 mg/dL (ref 0.44–1.00)
Glucose, Bld: 79 mg/dL (ref 65–99)
POTASSIUM: 5 mmol/L (ref 3.5–5.1)
SODIUM: 138 mmol/L (ref 135–145)

## 2017-09-20 LAB — HEMOGLOBIN A1C
HEMOGLOBIN A1C: 7.7 % — AB (ref 4.8–5.6)
MEAN PLASMA GLUCOSE: 174.29 mg/dL

## 2017-09-20 LAB — GLUCOSE, CAPILLARY: Glucose-Capillary: 83 mg/dL (ref 65–99)

## 2017-09-21 NOTE — Pre-Procedure Instructions (Signed)
HgbA1C routed to PCP. 

## 2017-09-22 ENCOUNTER — Other Ambulatory Visit: Payer: Self-pay | Admitting: Family Medicine

## 2017-09-26 ENCOUNTER — Ambulatory Visit (HOSPITAL_COMMUNITY): Payer: Medicare Other | Admitting: Anesthesiology

## 2017-09-26 ENCOUNTER — Encounter (HOSPITAL_COMMUNITY)
Admission: RE | Disposition: A | Discharge status: Home / Self Care | Payer: Self-pay | Source: Ambulatory Visit | Attending: Ophthalmology

## 2017-09-26 ENCOUNTER — Ambulatory Visit (HOSPITAL_COMMUNITY)
Admission: RE | Admit: 2017-09-26 | Discharge: 2017-09-26 | Disposition: A | Discharge status: Home / Self Care | Payer: Medicare Other | Source: Ambulatory Visit | Attending: Ophthalmology | Admitting: Ophthalmology

## 2017-09-26 DIAGNOSIS — I739 Peripheral vascular disease, unspecified: Secondary | ICD-10-CM | POA: Insufficient documentation

## 2017-09-26 DIAGNOSIS — I10 Essential (primary) hypertension: Secondary | ICD-10-CM | POA: Diagnosis not present

## 2017-09-26 DIAGNOSIS — F419 Anxiety disorder, unspecified: Secondary | ICD-10-CM | POA: Diagnosis not present

## 2017-09-26 DIAGNOSIS — E1136 Type 2 diabetes mellitus with diabetic cataract: Secondary | ICD-10-CM | POA: Insufficient documentation

## 2017-09-26 DIAGNOSIS — I2510 Atherosclerotic heart disease of native coronary artery without angina pectoris: Secondary | ICD-10-CM | POA: Diagnosis not present

## 2017-09-26 DIAGNOSIS — Z7984 Long term (current) use of oral hypoglycemic drugs: Secondary | ICD-10-CM | POA: Insufficient documentation

## 2017-09-26 DIAGNOSIS — Z79899 Other long term (current) drug therapy: Secondary | ICD-10-CM | POA: Diagnosis not present

## 2017-09-26 HISTORY — PX: CATARACT EXTRACTION W/PHACO: SHX586

## 2017-09-26 LAB — GLUCOSE, CAPILLARY
GLUCOSE-CAPILLARY: 118 mg/dL — AB (ref 65–99)
Glucose-Capillary: 82 mg/dL (ref 65–99)

## 2017-09-26 SURGERY — PHACOEMULSIFICATION, CATARACT, WITH IOL INSERTION
Anesthesia: Monitor Anesthesia Care | Site: Eye | Laterality: Right

## 2017-09-26 MED ORDER — LACTATED RINGERS IV SOLN
INTRAVENOUS | Status: DC
  Administered 2017-09-26: 1000 mL via INTRAVENOUS

## 2017-09-26 MED ORDER — BSS IO SOLN
INTRAOCULAR | Status: DC | PRN
  Administered 2017-09-26: 15 mL

## 2017-09-26 MED ORDER — DEXTROSE 50 % IV SOLN
25.0000 mL | Freq: Once | INTRAVENOUS | Status: AC
  Administered 2017-09-26: 25 mL via INTRAVENOUS

## 2017-09-26 MED ORDER — LIDOCAINE HCL 3.5 % OP GEL
1.0000 | Freq: Once | OPHTHALMIC | Status: AC
  Administered 2017-09-26: 1 via OPHTHALMIC

## 2017-09-26 MED ORDER — POVIDONE-IODINE 5 % OP SOLN
OPHTHALMIC | Status: DC | PRN
  Administered 2017-09-26: 1 via OPHTHALMIC

## 2017-09-26 MED ORDER — LIDOCAINE HCL (PF) 1 % IJ SOLN
INTRAMUSCULAR | Status: DC | PRN
  Administered 2017-09-26: .7 mL

## 2017-09-26 MED ORDER — TETRACAINE HCL 0.5 % OP SOLN
1.0000 [drp] | OPHTHALMIC | Status: AC
  Administered 2017-09-26 (×3): 1 [drp] via OPHTHALMIC

## 2017-09-26 MED ORDER — CYCLOPENTOLATE-PHENYLEPHRINE 0.2-1 % OP SOLN
1.0000 [drp] | OPHTHALMIC | Status: AC
  Administered 2017-09-26 (×3): 1 [drp] via OPHTHALMIC

## 2017-09-26 MED ORDER — FENTANYL CITRATE (PF) 100 MCG/2ML IJ SOLN
25.0000 ug | Freq: Once | INTRAMUSCULAR | Status: AC
  Administered 2017-09-26: 25 ug via INTRAVENOUS

## 2017-09-26 MED ORDER — PHENYLEPHRINE HCL 2.5 % OP SOLN
1.0000 [drp] | OPHTHALMIC | Status: AC
  Administered 2017-09-26 (×3): 1 [drp] via OPHTHALMIC

## 2017-09-26 MED ORDER — MIDAZOLAM HCL 2 MG/2ML IJ SOLN
INTRAMUSCULAR | Status: AC
  Filled 2017-09-26: qty 2

## 2017-09-26 MED ORDER — PROVISC 10 MG/ML IO SOLN
INTRAOCULAR | Status: DC | PRN
  Administered 2017-09-26: 0.85 mL via INTRAOCULAR

## 2017-09-26 MED ORDER — DEXTROSE 50 % IV SOLN
INTRAVENOUS | Status: AC
  Filled 2017-09-26: qty 50

## 2017-09-26 MED ORDER — FENTANYL CITRATE (PF) 100 MCG/2ML IJ SOLN
INTRAMUSCULAR | Status: AC
  Filled 2017-09-26: qty 2

## 2017-09-26 MED ORDER — MIDAZOLAM HCL 2 MG/2ML IJ SOLN
1.0000 mg | INTRAMUSCULAR | Status: AC
  Administered 2017-09-26: 2 mg via INTRAVENOUS

## 2017-09-26 MED ORDER — EPINEPHRINE PF 1 MG/ML IJ SOLN
INTRAMUSCULAR | Status: DC | PRN
  Administered 2017-09-26: 500 mL

## 2017-09-26 MED ORDER — NEOMYCIN-POLYMYXIN-DEXAMETH 3.5-10000-0.1 OP SUSP
OPHTHALMIC | Status: DC | PRN
  Administered 2017-09-26: 2 [drp] via OPHTHALMIC

## 2017-09-26 SURGICAL SUPPLY — 10 items
CLOTH BEACON ORANGE TIMEOUT ST (SAFETY) ×2 IMPLANT
EYE SHIELD UNIVERSAL CLEAR (GAUZE/BANDAGES/DRESSINGS) ×2 IMPLANT
GLOVE BIOGEL PI IND STRL 7.0 (GLOVE) IMPLANT
GLOVE BIOGEL PI INDICATOR 7.0 (GLOVE) ×2
LENS ALC ACRYL/TECN (Ophthalmic Related) ×2 IMPLANT
PAD ARMBOARD 7.5X6 YLW CONV (MISCELLANEOUS) ×2 IMPLANT
SYRINGE LUER LOK 1CC (MISCELLANEOUS) ×2 IMPLANT
TAPE SURG TRANSPORE 1 IN (GAUZE/BANDAGES/DRESSINGS) IMPLANT
TAPE SURGICAL TRANSPORE 1 IN (GAUZE/BANDAGES/DRESSINGS) ×2
WATER STERILE IRR 250ML POUR (IV SOLUTION) ×2 IMPLANT

## 2017-09-26 NOTE — Anesthesia Postprocedure Evaluation (Signed)
Anesthesia Post Note  Patient: [name]  Procedure(s) Performed: CATARACT EXTRACTION PHACO AND INTRAOCULAR LENS PLACEMENT (IOC) (Right Eye)  Patient location during evaluation: Short Stay Anesthesia Type: MAC Level of consciousness: awake and patient cooperative Pain management: pain level controlled Vital Signs Assessment: post-procedure vital signs reviewed and stable Respiratory status: spontaneous breathing, nonlabored ventilation and respiratory function stable Cardiovascular status: blood pressure returned to baseline Postop Assessment: no apparent nausea or vomiting Anesthetic complications: no     Last Vitals:  Vitals:   09/26/17 1145 09/26/17 1200  BP: (!) 153/59 (!) 162/62  Pulse:    Resp: 12 14  Temp:    SpO2: 100% 100%    Last Pain: There were no vitals filed for this visit.               Sherrina Zaugg J

## 2017-09-26 NOTE — Transfer of Care (Signed)
Immediate Anesthesia Transfer of Care Note  Patient: [name]  Procedure(s) Performed: CATARACT EXTRACTION PHACO AND INTRAOCULAR LENS PLACEMENT (IOC) (Right Eye)  Patient Location: Short Stay  Anesthesia Type:MAC  Level of Consciousness: awake  Airway & Oxygen Therapy: Patient Spontanous Breathing  Post-op Assessment: Report given to RN and Post -op Vital signs reviewed and stable  Post vital signs: Reviewed and stable  Last Vitals:  Vitals:   09/26/17 1145 09/26/17 1200  BP: (!) 153/59 (!) 162/62  Pulse:    Resp: 12 14  Temp:    SpO2: 100% 100%    Last Pain: There were no vitals filed for this visit.       Complications: No apparent anesthesia complications

## 2017-09-26 NOTE — H&P (Signed)
I have reviewed the H&P, the patient was re-examined, and I have identified no interval changes in medical condition and plan of care since the history and physical of record  

## 2017-09-26 NOTE — Anesthesia Preprocedure Evaluation (Signed)
Anesthesia Evaluation  Patient identified by MRN, date of birth, ID band Patient awake    Reviewed: Allergy & Precautions, NPO status , Patient's Chart, lab work & pertinent test results  Airway Mallampati: II  TM Distance: >3 FB Neck ROM: Full    Dental  (+) Edentulous Upper, Edentulous Lower   Pulmonary    Pulmonary exam normal        Cardiovascular hypertension, Pt. on medications + CAD and + Peripheral Vascular Disease  Normal cardiovascular exam+ dysrhythmias      Neuro/Psych PSYCHIATRIC DISORDERS Anxiety  Neuromuscular disease CVA    GI/Hepatic negative GI ROS,   Endo/Other  diabetes, Type 2, Oral Hypoglycemic Agents  Renal/GU      Musculoskeletal   Abdominal   Peds  Hematology   Anesthesia Other Findings   Reproductive/Obstetrics                             Anesthesia Physical Anesthesia Plan  ASA: III  Anesthesia Plan: MAC   Post-op Pain Management:    Induction:   PONV Risk Score and Plan:   Airway Management Planned: Nasal Cannula  Additional Equipment:   Intra-op Plan:   Post-operative Plan:   Informed Consent: I have reviewed the patients History and Physical, chart, labs and discussed the procedure including the risks, benefits and alternatives for the proposed anesthesia with the patient or authorized representative who has indicated his/her understanding and acceptance.     Plan Discussed with:   Anesthesia Plan Comments:         Anesthesia Quick Evaluation  

## 2017-09-26 NOTE — Op Note (Signed)
Date of Admission: 09/26/2017  Date of Surgery: 09/26/2017  Pre-Op Dx: Cataract Right  Eye  Post-Op Dx: Senile Nuclear Cataract  Right  Eye,  Dx Code H25.11  Surgeon: Tonny Branch, M.D.  Assistants: None  Anesthesia: Topical with MAC  Indications: Painless, progressive loss of vision with compromise of daily activities.  Surgery: Cataract Extraction with Intraocular lens Implant Right Eye  Discription: The patient had dilating drops and viscous lidocaine placed into the Right eye in the pre-op holding area. After transfer to the operating room, a time out was performed. The patient was then prepped and draped. Beginning with a 45m blade a paracentesis port was made at the surgeon's 2 o'clock position. The anterior chamber was then filled with 1% non-preserved lidocaine. This was followed by filling the anterior chamber with Provisc.  A 2.462mkeratome blade was used to make a clear corneal incision at the temporal limbus.  A bent cystatome needle was used to create a continuous tear capsulotomy. Hydrodissection was performed with balanced salt solution on a Fine canula. The lens nucleus was then removed using the phacoemulsification handpiece. Residual cortex was removed with the I&A handpiece. The anterior chamber and capsular bag were refilled with Provisc. A posterior chamber intraocular lens was placed into the capsular bag with it's injector. The implant was positioned with the Kuglan hook. The Provisc was then removed from the anterior chamber and capsular bag with the I&A handpiece. Stromal hydration of the main incision and paracentesis port was performed with BSS on a Fine canula. The wounds were tested for leak which was negative. The patient tolerated the procedure well. There were no operative complications. The patient was then transferred to the recovery room in stable condition.  Complications: None  Specimen: None  EBL: None  Prosthetic device: J&J Technis, PCB00, power 22.0,  SN [id number]

## 2017-09-26 NOTE — Progress Notes (Signed)
Dr Patsey Berthold notified of fingerstick blood sugar 82. Order given for dextrose.

## 2017-09-27 ENCOUNTER — Encounter (HOSPITAL_COMMUNITY): Payer: Self-pay | Admitting: Ophthalmology

## 2017-09-28 ENCOUNTER — Other Ambulatory Visit: Payer: Self-pay | Admitting: Family Medicine

## 2017-09-29 ENCOUNTER — Ambulatory Visit: Payer: Medicare Other | Admitting: Family Medicine

## 2017-10-12 ENCOUNTER — Ambulatory Visit (INDEPENDENT_AMBULATORY_CARE_PROVIDER_SITE_OTHER): Payer: Medicare Other | Admitting: Family Medicine

## 2017-10-12 ENCOUNTER — Encounter: Payer: Self-pay | Admitting: Family Medicine

## 2017-10-12 VITALS — BP 131/64 | HR 73 | Temp 95.9°F | Ht 65.0 in | Wt 144.6 lb

## 2017-10-12 DIAGNOSIS — E1159 Type 2 diabetes mellitus with other circulatory complications: Secondary | ICD-10-CM

## 2017-10-12 DIAGNOSIS — S79911A Unspecified injury of right hip, initial encounter: Secondary | ICD-10-CM

## 2017-10-12 DIAGNOSIS — I2510 Atherosclerotic heart disease of native coronary artery without angina pectoris: Secondary | ICD-10-CM | POA: Diagnosis not present

## 2017-10-12 DIAGNOSIS — K5903 Drug induced constipation: Secondary | ICD-10-CM | POA: Diagnosis not present

## 2017-10-12 DIAGNOSIS — S0992XA Unspecified injury of nose, initial encounter: Secondary | ICD-10-CM | POA: Diagnosis not present

## 2017-10-12 MED ORDER — LINACLOTIDE 290 MCG PO CAPS: 290.0000 ug | ORAL_CAPSULE | Freq: Every day | ORAL | 0 refills | Status: DC

## 2017-10-12 MED ORDER — INSULIN DEGLUDEC 100 UNIT/ML ~~LOC~~ SOPN: 16.0000 [IU] | PEN_INJECTOR | Freq: Every day | SUBCUTANEOUS | 2 refills | Status: DC

## 2017-10-12 MED ORDER — INSULIN ASPART 100 UNIT/ML ~~LOC~~ SOLN: SUBCUTANEOUS | Status: DC

## 2017-10-12 NOTE — Progress Notes (Signed)
   HPI  Patient presents today here to follow-up for diabetes.  Diabetes Patient is tolerating [place] well. She is using 20 units a day. She states that she has had a few low blood sugars, one as low as 50s. Average fasting blood sugar is 100-110.  She is using a sliding scale NovoLog, she is a 0-4 units most of the time.  She takes this about 2 hours after she eats. Postprandial is 150-200  Patient still suffering from opioid-induced constipation, she states that Movantik was not helpful She also tried something she does not remember from her pain doctor which did not help either.  She fell and landed on her nose about 2 weeks ago, she states the pain and swelling had improved but wonders if it is broken.  She tripped over her dog at that time  She has fallen in the night on another occasion landing on her right hip with some pain that is also improving.   PMH: Smoking status noted ROS: Per HPI  Objective: BP 131/64   Pulse 73   Temp (!) 95.9 F (35.5 C) (Oral)   Ht 5\' 5"  (1.651 m)   Wt 144 lb 9.6 oz (65.6 kg)   BMI 24.06 kg/m  Gen: NAD, alert, cooperative with exam HEENT: NCAT, is a bridge appears atraumatic, nares are clear, no apparent fracture CV: RRR, good S1/S2, no murmur Resp: CTABL, no wheezes, non-labored Abd: SNTND, BS present, no guarding or organomegaly Ext: No edema, warm Neuro: Alert and oriented, No gross deficits MSK Negative straight leg raise on the right, tenderness to palpation over the right buttock Strength preserved in the right leg  Assessment and plan:  #Type 2 diabetes Decrease basal insulin to 16 units, changing sliding scale NovoLog to 3 units before each meal, recommended checking postprandial blood sugar approximately 2 hours after she eats as well.  #Drug-induced constipation Trial of Linzess Samples given  #Injury of the nose No clear fracture, 2 weeks ago No impedance of the airway  #Right hip injury. Pain as expected with  a fall, no signs of fracture, patient is bearing weight easily.  Exam is reassuring   Meds ordered this encounter  Medications  . insulin degludec (TRESIBA FLEXTOUCH) 100 UNIT/ML SOPN FlexTouch Pen    Sig: Inject 0.16 mLs (16 Units total) into the skin daily. INJECT 0.2 MLS (20 UNITS TOTAL) INTO THE SKIN DAILY AT 10 AM    Dispense:  15 pen    Refill:  2  . insulin aspart (NOVOLOG) 100 UNIT/ML injection    Sig: 3 units twice daily just before lunch and dinner    Dispense:  10 mL  . linaclotide (LINZESS) 290 MCG CAPS capsule    Sig: Take 1 capsule (290 mcg total) by mouth daily before breakfast for 8 doses.    Dispense:  8 capsule    Refill:  [place], MD [facility] 10/12/2017, 2:30 PM

## 2017-10-12 NOTE — Patient Instructions (Signed)
Great to see you!  Come back shortly after Feb 6  Decrease tresiba to 16 units daily Change novolog to 3 units twice daily just before lunch and dinner ( around 5 minutes before)

## 2017-10-15 ENCOUNTER — Other Ambulatory Visit: Payer: Self-pay | Admitting: Family Medicine

## 2017-10-18 ENCOUNTER — Encounter (HOSPITAL_COMMUNITY)
Admission: RE | Admit: 2017-10-18 | Discharge: 2017-10-18 | Disposition: A | Discharge status: Home / Self Care | Payer: Medicare Other | Source: Ambulatory Visit | Attending: Ophthalmology | Admitting: Ophthalmology

## 2017-10-21 ENCOUNTER — Other Ambulatory Visit: Payer: Self-pay | Admitting: Family Medicine

## 2017-10-26 ENCOUNTER — Other Ambulatory Visit: Payer: Self-pay | Admitting: Cardiology

## 2017-10-26 DIAGNOSIS — I6523 Occlusion and stenosis of bilateral carotid arteries: Secondary | ICD-10-CM

## 2017-10-27 ENCOUNTER — Encounter (HOSPITAL_COMMUNITY)
Admission: RE | Admit: 2017-10-27 | Discharge: 2017-10-27 | Disposition: A | Discharge status: Home / Self Care | Payer: Medicare Other | Source: Ambulatory Visit | Attending: Ophthalmology | Admitting: Ophthalmology

## 2017-10-31 ENCOUNTER — Encounter (HOSPITAL_COMMUNITY)
Admission: RE | Disposition: A | Discharge status: Home / Self Care | Payer: Self-pay | Source: Ambulatory Visit | Attending: Ophthalmology

## 2017-10-31 ENCOUNTER — Encounter (HOSPITAL_COMMUNITY): Payer: Self-pay | Admitting: *Deleted

## 2017-10-31 ENCOUNTER — Ambulatory Visit (HOSPITAL_COMMUNITY)
Admission: RE | Admit: 2017-10-31 | Discharge: 2017-10-31 | Disposition: A | Discharge status: Home / Self Care | Payer: Medicare Other | Source: Ambulatory Visit | Attending: Ophthalmology | Admitting: Ophthalmology

## 2017-10-31 ENCOUNTER — Ambulatory Visit (HOSPITAL_COMMUNITY): Payer: Medicare Other | Admitting: Anesthesiology

## 2017-10-31 DIAGNOSIS — I739 Peripheral vascular disease, unspecified: Secondary | ICD-10-CM | POA: Diagnosis not present

## 2017-10-31 DIAGNOSIS — Z7984 Long term (current) use of oral hypoglycemic drugs: Secondary | ICD-10-CM | POA: Diagnosis not present

## 2017-10-31 DIAGNOSIS — E1136 Type 2 diabetes mellitus with diabetic cataract: Secondary | ICD-10-CM | POA: Diagnosis not present

## 2017-10-31 DIAGNOSIS — I2510 Atherosclerotic heart disease of native coronary artery without angina pectoris: Secondary | ICD-10-CM | POA: Insufficient documentation

## 2017-10-31 DIAGNOSIS — Z79899 Other long term (current) drug therapy: Secondary | ICD-10-CM | POA: Insufficient documentation

## 2017-10-31 DIAGNOSIS — I10 Essential (primary) hypertension: Secondary | ICD-10-CM | POA: Insufficient documentation

## 2017-10-31 DIAGNOSIS — F419 Anxiety disorder, unspecified: Secondary | ICD-10-CM | POA: Diagnosis not present

## 2017-10-31 HISTORY — PX: CATARACT EXTRACTION W/PHACO: SHX586

## 2017-10-31 LAB — GLUCOSE, CAPILLARY: GLUCOSE-CAPILLARY: 84 mg/dL (ref 65–99)

## 2017-10-31 SURGERY — PHACOEMULSIFICATION, CATARACT, WITH IOL INSERTION
Anesthesia: Monitor Anesthesia Care | Site: Eye | Laterality: Left

## 2017-10-31 MED ORDER — LIDOCAINE HCL 3.5 % OP GEL
1.0000 "application " | Freq: Once | OPHTHALMIC | Status: AC
  Administered 2017-10-31: 1 via OPHTHALMIC

## 2017-10-31 MED ORDER — FENTANYL CITRATE (PF) 100 MCG/2ML IJ SOLN
INTRAMUSCULAR | Status: AC
  Filled 2017-10-31: qty 2

## 2017-10-31 MED ORDER — LACTATED RINGERS IV SOLN
INTRAVENOUS | Status: DC
  Administered 2017-10-31: 08:00:00 via INTRAVENOUS

## 2017-10-31 MED ORDER — TETRACAINE HCL 0.5 % OP SOLN
1.0000 [drp] | OPHTHALMIC | Status: AC
  Administered 2017-10-31 (×3): 1 [drp] via OPHTHALMIC

## 2017-10-31 MED ORDER — NEOMYCIN-POLYMYXIN-DEXAMETH 3.5-10000-0.1 OP SUSP
OPHTHALMIC | Status: DC | PRN
  Administered 2017-10-31: 2 [drp] via OPHTHALMIC

## 2017-10-31 MED ORDER — PROVISC 10 MG/ML IO SOLN
INTRAOCULAR | Status: DC | PRN
  Administered 2017-10-31: 0.85 mL via INTRAOCULAR

## 2017-10-31 MED ORDER — FENTANYL CITRATE (PF) 100 MCG/2ML IJ SOLN
25.0000 ug | Freq: Once | INTRAMUSCULAR | Status: AC
  Administered 2017-10-31: 25 ug via INTRAVENOUS

## 2017-10-31 MED ORDER — MIDAZOLAM HCL 2 MG/2ML IJ SOLN
INTRAMUSCULAR | Status: AC
  Filled 2017-10-31: qty 2

## 2017-10-31 MED ORDER — LIDOCAINE HCL (PF) 1 % IJ SOLN
INTRAMUSCULAR | Status: DC | PRN
  Administered 2017-10-31: .5 mL

## 2017-10-31 MED ORDER — CYCLOPENTOLATE-PHENYLEPHRINE 0.2-1 % OP SOLN
1.0000 [drp] | OPHTHALMIC | Status: AC
  Administered 2017-10-31 (×3): 1 [drp] via OPHTHALMIC

## 2017-10-31 MED ORDER — POVIDONE-IODINE 5 % OP SOLN
OPHTHALMIC | Status: DC | PRN
  Administered 2017-10-31: 1 via OPHTHALMIC

## 2017-10-31 MED ORDER — BSS IO SOLN
INTRAOCULAR | Status: DC | PRN
Start: 2017-10-31 — End: 2017-10-31
  Administered 2017-10-31: 500 mL

## 2017-10-31 MED ORDER — PHENYLEPHRINE HCL 2.5 % OP SOLN
1.0000 [drp] | OPHTHALMIC | Status: AC
  Administered 2017-10-31 (×3): 1 [drp] via OPHTHALMIC

## 2017-10-31 MED ORDER — BSS IO SOLN
INTRAOCULAR | Status: DC | PRN
Start: 2017-10-31 — End: 2017-10-31
  Administered 2017-10-31: 15 mL

## 2017-10-31 MED ORDER — MIDAZOLAM HCL 2 MG/2ML IJ SOLN
1.0000 mg | INTRAMUSCULAR | Status: AC
  Administered 2017-10-31: 2 mg via INTRAVENOUS

## 2017-10-31 SURGICAL SUPPLY — 12 items

## 2017-10-31 NOTE — Discharge Instructions (Signed)

## 2017-10-31 NOTE — Anesthesia Preprocedure Evaluation (Signed)
Anesthesia Evaluation  Patient identified by MRN, date of birth, ID band Patient awake    Reviewed: Allergy & Precautions, NPO status , Patient's Chart, lab work & pertinent test results  Airway Mallampati: II  TM Distance: >3 FB Neck ROM: Full    Dental  (+) Edentulous Upper, Edentulous Lower   Pulmonary    Pulmonary exam normal        Cardiovascular hypertension, Pt. on medications + CAD and + Peripheral Vascular Disease  Normal cardiovascular exam+ dysrhythmias      Neuro/Psych PSYCHIATRIC DISORDERS Anxiety  Neuromuscular disease CVA    GI/Hepatic negative GI ROS,   Endo/Other  diabetes, Type 2, Oral Hypoglycemic Agents  Renal/GU      Musculoskeletal   Abdominal   Peds  Hematology   Anesthesia Other Findings   Reproductive/Obstetrics                             Anesthesia Physical Anesthesia Plan  ASA: III  Anesthesia Plan: MAC   Post-op Pain Management:    Induction:   PONV Risk Score and Plan:   Airway Management Planned: Nasal Cannula  Additional Equipment:   Intra-op Plan:   Post-operative Plan:   Informed Consent: I have reviewed the patients History and Physical, chart, labs and discussed the procedure including the risks, benefits and alternatives for the proposed anesthesia with the patient or authorized representative who has indicated his/her understanding and acceptance.     Plan Discussed with:   Anesthesia Plan Comments:         Anesthesia Quick Evaluation

## 2017-10-31 NOTE — Anesthesia Procedure Notes (Signed)
Procedure Name: MAC Date/Time: 10/31/2017 8:25 AM Performed by: Vista Deck, CRNA Pre-anesthesia Checklist: Patient identified, Emergency Drugs available, Suction available, Timeout performed and Patient being monitored Patient Re-evaluated:Patient Re-evaluated prior to induction Oxygen Delivery Method: Nasal Cannula

## 2017-10-31 NOTE — Anesthesia Postprocedure Evaluation (Signed)
Anesthesia Post Note  Patient: [name]  Procedure(s) Performed: CATARACT EXTRACTION PHACO AND INTRAOCULAR LENS PLACEMENT LEFT EYE CDE=7.75 (Left Eye)  Patient location during evaluation: Short Stay Anesthesia Type: MAC Level of consciousness: awake and alert Pain management: pain level controlled Vital Signs Assessment: post-procedure vital signs reviewed and stable Respiratory status: spontaneous breathing Cardiovascular status: stable Postop Assessment: no apparent nausea or vomiting Anesthetic complications: no     Last Vitals:  Vitals:   10/31/17 0810 10/31/17 0820  BP:  103/62  Pulse:    Resp: 14 18  Temp:    SpO2: 100% 100%    Last Pain:  Vitals:   10/31/17 0747  TempSrc: Oral                 Darell Saputo

## 2017-10-31 NOTE — Transfer of Care (Signed)
Immediate Anesthesia Transfer of Care Note  Patient: [name]  Procedure(s) Performed: CATARACT EXTRACTION PHACO AND INTRAOCULAR LENS PLACEMENT LEFT EYE CDE=7.75 (Left Eye)  Patient Location: Short Stay  Anesthesia Type:MAC  Level of Consciousness: awake, alert  and patient cooperative  Airway & Oxygen Therapy: Patient Spontanous Breathing  Post-op Assessment: Report given to RN and Post -op Vital signs reviewed and stable  Post vital signs: Reviewed and stable  Last Vitals:  Vitals:   10/31/17 0810 10/31/17 0820  BP:  103/62  Pulse:    Resp: 14 18  Temp:    SpO2: 100% 100%    Last Pain:  Vitals:   10/31/17 0747  TempSrc: Oral      Patients Stated Pain Goal: 4 (12/45/80 9983)  Complications: No apparent anesthesia complications

## 2017-10-31 NOTE — Op Note (Signed)
Date of Admission: 10/31/2017  Date of Surgery: 10/31/2017  Pre-Op Dx: Cataract Left  Eye  Post-Op Dx: Senile Nuclear Cataract  Left  Eye,  Dx Code H25.12  Surgeon: Tonny Branch, M.D.  Assistants: None  Anesthesia: Topical with MAC  Indications: Painless, progressive loss of vision with compromise of daily activities.  Surgery: Cataract Extraction with Intraocular lens Implant Left Eye  Discription: The patient had dilating drops and viscous lidocaine placed into the Left eye in the pre-op holding area. After transfer to the operating room, a time out was performed. The patient was then prepped and draped. Beginning with a 20m blade a paracentesis port was made at the surgeon's 2 o'clock position. The anterior chamber was then filled with 1% non-preserved lidocaine. This was followed by filling the anterior chamber with Provisc.  A 2.436mkeratome blade was used to make a clear corneal incision at the temporal limbus.  A bent cystatome needle was used to create a continuous tear capsulotomy. Hydrodissection was performed with balanced salt solution on a Fine canula. The lens nucleus was then removed using the phacoemulsification handpiece. Residual cortex was removed with the I&A handpiece. The anterior chamber and capsular bag were refilled with Provisc. A posterior chamber intraocular lens was placed into the capsular bag with it's injector. The implant was positioned with the Kuglan hook. The Provisc was then removed from the anterior chamber and capsular bag with the I&A handpiece. Stromal hydration of the main incision and paracentesis port was performed with BSS on a Fine canula. The wounds were tested for leak which was negative. The patient tolerated the procedure well. There were no operative complications. The patient was then transferred to the recovery room in stable condition.  Complications: None  Specimen: None  EBL: None  Prosthetic device: J&J Technis, PCB00, power 22.0, SN  [id number]

## 2017-11-01 ENCOUNTER — Encounter (HOSPITAL_COMMUNITY): Payer: Self-pay | Admitting: Ophthalmology

## 2017-11-05 ENCOUNTER — Other Ambulatory Visit: Payer: Self-pay | Admitting: Family Medicine

## 2017-11-23 ENCOUNTER — Ambulatory Visit (INDEPENDENT_AMBULATORY_CARE_PROVIDER_SITE_OTHER): Payer: Medicare Other

## 2017-11-23 DIAGNOSIS — I6523 Occlusion and stenosis of bilateral carotid arteries: Secondary | ICD-10-CM

## 2017-12-01 ENCOUNTER — Other Ambulatory Visit: Payer: Self-pay | Admitting: *Deleted

## 2017-12-01 ENCOUNTER — Telehealth: Payer: Self-pay

## 2017-12-01 MED ORDER — GLUCOSE BLOOD VI STRP: ORAL_STRIP | 3 refills | Status: DC

## 2017-12-01 NOTE — Telephone Encounter (Signed)
Fax received for new order Medicare Part B standard utilization guidelines Non-insulin dependent DM Sig test qd Max #100 strips/lancets q 62mos Insulin dependent DM Sig test qd / BID / TID Max #300 strips/lancets q 76mos  New Rx sent to [place]

## 2017-12-01 NOTE — Telephone Encounter (Signed)
Called pt's house/cel phone. Not able to leave message on either phone. Will mail pt a letter asking her to call the office as soon as she is able.

## 2017-12-01 NOTE — Telephone Encounter (Signed)
-----   Message from Arnoldo Lenis, MD sent at 12/01/2017 11:00 AM EST ----- No evidence of significant blockages by carotid US   Zandra Abts MD

## 2017-12-02 ENCOUNTER — Ambulatory Visit: Payer: Medicare Other | Admitting: Cardiology

## 2017-12-07 ENCOUNTER — Telehealth: Payer: Self-pay

## 2017-12-07 DIAGNOSIS — C50919 Malignant neoplasm of unspecified site of unspecified female breast: Secondary | ICD-10-CM

## 2017-12-07 NOTE — Telephone Encounter (Signed)
Patient would like to go back to [place] center so they need a referral

## 2017-12-09 LAB — HM DIABETES EYE EXAM

## 2017-12-23 ENCOUNTER — Encounter: Payer: Self-pay | Admitting: Family Medicine

## 2017-12-23 ENCOUNTER — Ambulatory Visit (INDEPENDENT_AMBULATORY_CARE_PROVIDER_SITE_OTHER): Payer: Medicare Other | Admitting: Family Medicine

## 2017-12-23 VITALS — BP 130/56 | HR 80 | Temp 97.2°F | Ht 65.0 in | Wt 145.5 lb

## 2017-12-23 DIAGNOSIS — I6523 Occlusion and stenosis of bilateral carotid arteries: Secondary | ICD-10-CM

## 2017-12-23 DIAGNOSIS — I499 Cardiac arrhythmia, unspecified: Secondary | ICD-10-CM

## 2017-12-23 DIAGNOSIS — E1159 Type 2 diabetes mellitus with other circulatory complications: Secondary | ICD-10-CM | POA: Diagnosis not present

## 2017-12-23 LAB — BAYER DCA HB A1C WAIVED: HB A1C: 7.3 % — AB (ref <7.0)

## 2017-12-23 MED ORDER — FOLIC ACID 1 MG PO TABS: 1.0000 mg | ORAL_TABLET | Freq: Every day | ORAL | 3 refills | Status: DC

## 2017-12-23 NOTE — Progress Notes (Signed)
   HPI  Patient presents today here for follow-up chronic medical conditions  [place] Patient has Very careful record since our last visit.  Her average fasting blood sugar has been around 120-150.  Her average postprandial has been 150-200. She reports good medication compliance and has had one episode of low blood sugar in the morning at 68. This was very symptomatic and she recovered easily.  She denies any irregular heartbeats. 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 She has had some recent episodes of chest pain reported as come and go left-sided sharp chest pain that last 3 to 4 minutes and comes on at rest or with exertion.  It is not predictably exertional. She has follow-up with her cardiologist in about 2 weeks.   PMH: Smoking status noted ROS: Per HPI  Objective: BP (!) 130/56   Pulse 80   Temp (!) 97.2 F (36.2 C) (Oral)   Ht '5\' 5"'$  (1.651 m)   Wt 145 lb 8 oz (66 kg)   BMI 24.21 kg/m  Gen: NAD, alert, cooperative with exam HEENT: NCAT, EOMI, PERRL CV: No murmur appreciated, irregular heartbeat Resp: CTABL, no wheezes, non-labored Ext: No edema, warm Neuro: Alert and oriented, No gross deficits  EKG: Sinus rhythm with frequent PACs  Assessment and plan:  #Type 2 diabetes Good control No changes Congratulated on her improvement Continue fasting blood sugars  #Irregular heartbeat -new problem Appreciated on exam Asymptomatic EKG shows frequent PACs, she has follow-up with cardiology    Orders Placed This Encounter  Procedures  . Bayer DCA Hb A1c Waived  . CBC with Differential/Platelet  . CMP14+EGFR  . EKG 12-Lead    Meds ordered this encounter  Medications  . folic acid (FOLVITE) 1 MG tablet    Sig: Take 1 tablet (1 mg total) by mouth daily.    Dispense:  90 tablet    Refill:  [place], MD [place] 12/23/2017, 11:54 AM

## 2017-12-24 LAB — CMP14+EGFR
A/G RATIO: 1.8 (ref 1.2–2.2)
ALK PHOS: 146 IU/L — AB (ref 39–117)
ALT: 21 IU/L (ref 0–32)
AST: 23 IU/L (ref 0–40)
Albumin: 3.9 g/dL (ref 3.6–4.8)
BILIRUBIN TOTAL: 0.3 mg/dL (ref 0.0–1.2)
BUN/Creatinine Ratio: 23 (ref 12–28)
BUN: 19 mg/dL (ref 8–27)
CHLORIDE: 103 mmol/L (ref 96–106)
CO2: 21 mmol/L (ref 20–29)
Calcium: 10.5 mg/dL — ABNORMAL HIGH (ref 8.7–10.3)
Creatinine, Ser: 0.82 mg/dL (ref 0.57–1.00)
GFR calc non Af Amer: 75 mL/min/{1.73_m2} (ref >59)
GFR, EST AFRICAN AMERICAN: 87 mL/min/{1.73_m2} (ref >59)
GLUCOSE: 154 mg/dL — AB (ref 65–99)
Globulin, Total: 2.2 g/dL (ref 1.5–4.5)
Potassium: 5.1 mmol/L (ref 3.5–5.2)
Sodium: 139 mmol/L (ref 134–144)
TOTAL PROTEIN: 6.1 g/dL (ref 6.0–8.5)

## 2017-12-24 LAB — CBC WITH DIFFERENTIAL/PLATELET
BASOS: 1 %
Basophils Absolute: 0 10*3/uL (ref 0.0–0.2)
EOS (ABSOLUTE): 0.1 10*3/uL (ref 0.0–0.4)
Eos: 2 %
Hematocrit: 37.7 % (ref 34.0–46.6)
Hemoglobin: 12 g/dL (ref 11.1–15.9)
IMMATURE GRANS (ABS): 0 10*3/uL (ref 0.0–0.1)
Immature Granulocytes: 0 %
LYMPHS: 27 %
Lymphocytes Absolute: 1.7 10*3/uL (ref 0.7–3.1)
MCH: 29.1 pg (ref 26.6–33.0)
MCHC: 31.8 g/dL (ref 31.5–35.7)
MCV: 91 fL (ref 79–97)
MONOS ABS: 0.3 10*3/uL (ref 0.1–0.9)
Monocytes: 5 %
NEUTROS ABS: 4.1 10*3/uL (ref 1.4–7.0)
Neutrophils: 65 %
PLATELETS: 265 10*3/uL (ref 150–379)
RBC: 4.13 x10E6/uL (ref 3.77–5.28)
RDW: 14.4 % (ref 12.3–15.4)
WBC: 6.2 10*3/uL (ref 3.4–10.8)

## 2017-12-25 ENCOUNTER — Other Ambulatory Visit: Payer: Self-pay | Admitting: Family Medicine

## 2018-01-09 ENCOUNTER — Ambulatory Visit (INDEPENDENT_AMBULATORY_CARE_PROVIDER_SITE_OTHER): Payer: Medicare Other | Admitting: Cardiology

## 2018-01-09 ENCOUNTER — Encounter: Payer: Self-pay | Admitting: Cardiology

## 2018-01-09 VITALS — BP 124/62 | HR 37 | Ht 64.0 in | Wt 142.8 lb

## 2018-01-09 DIAGNOSIS — I6523 Occlusion and stenosis of bilateral carotid arteries: Secondary | ICD-10-CM

## 2018-01-09 DIAGNOSIS — E782 Mixed hyperlipidemia: Secondary | ICD-10-CM

## 2018-01-09 DIAGNOSIS — I2510 Atherosclerotic heart disease of native coronary artery without angina pectoris: Secondary | ICD-10-CM

## 2018-01-09 DIAGNOSIS — I493 Ventricular premature depolarization: Secondary | ICD-10-CM

## 2018-01-09 DIAGNOSIS — I10 Essential (primary) hypertension: Secondary | ICD-10-CM

## 2018-01-09 NOTE — Progress Notes (Signed)
Clinical Summary [name] is a 67 y.o.female seen today for follow up of the following medical problems.   1. CAD - hx of CABG in 2007, normal LVEF 60% - 02/2016 she had nuclear stress without clear ischemia.     - no recent chest pain. No SOB or DOe - compliant with meds  2. HTN - she is compliant with meds   3. HL - she stopped lipitor due to dizziness.  -  started pravastatin 20mg  daily, tolerating well  - lipids 04/2017 TC 140 TG 37 HDL 69 LDL 64  4. Hx of CVA - she is on both ASA and plavix, she is unsure exactly why. Assume its related to her hx of CVA as no clear cardiac indication to be on plavix from records   5. Syncope - seen in ER 05/21/17 - initially low bp's by EMS, resolved with IVFs - elevated Cr and BUN from baseline supporting dehydration. - significant abdominal pain at the time, CT showed severe constipation  - no recent episodes  6. PVCs - noted at recent pcp visit.  - denies any palpitations.  - K 5.1 - 1-2 cups of coffee. No other caffeine   SH: husband and a brother passed recently    Past Medical History:  Diagnosis Date  . Anxiety    no rx  . Arthritis   . Cancer [facility])    Left mastectomy, 2/11  . Carotid disease, bilateral ([place])    Followed by Dr. Leonie Man  . Carotid disease, bilateral ([place]) 10/21/2011  . Coronary artery disease    cabg 2007  . Diabetes mellitus   . HLD (hyperlipidemia)   . Hypertension   . Neuromuscular disorder ([place])   . Neuropathic pain, leg   . Stroke [facility])    more than 10 yrs weakness rt leg  . Stroke, lacunar    Followed by Dr. Leonie Man     No Known Allergies   Current Outpatient Medications  Medication Sig Dispense Refill  . aspirin 81 MG chewable tablet Chew 81 mg by mouth daily.      . clopidogrel (PLAVIX) 75 MG tablet TAKE 1 TABLET BY MOUTH EVERY DAY 90 tablet 0  . DULoxetine (CYMBALTA) 30 MG capsule Take 1 capsule (30 mg total) by mouth daily. 90 capsule 3  . folic acid  (FOLVITE) 1 MG tablet Take 1 tablet (1 mg total) by mouth daily. 90 tablet 3  . gabapentin (NEURONTIN) 400 MG capsule TAKE 1 CAPSULE BY MOUTH THREE TIMES A DAY 270 capsule 0  . glucose blood (ACCU-CHEK AVIVA PLUS) test strip Use to check blood sugars TID 300 each 3  . ibuprofen (ADVIL,MOTRIN) 200 MG tablet Take 400 mg by mouth every 8 (eight) hours as needed for headache or moderate pain.    Marland Kitchen insulin aspart (NOVOLOG) 100 UNIT/ML injection 3 units twice daily just before lunch and dinner 10 mL   . insulin degludec (TRESIBA FLEXTOUCH) 100 UNIT/ML SOPN FlexTouch Pen Inject 0.16 mLs (16 Units total) into the skin daily. INJECT 0.2 MLS (20 UNITS TOTAL) INTO THE SKIN DAILY AT 10 AM 15 pen 2  . linaclotide (LINZESS) 290 MCG CAPS capsule Take 1 capsule (290 mcg total) by mouth daily before breakfast for 8 doses. 8 capsule 0  . lisinopril (PRINIVIL,ZESTRIL) 10 MG tablet Take 1 tablet (10 mg total) by mouth daily. 90 tablet 3  . metFORMIN (GLUCOPHAGE) 1000 MG tablet TAKE 1 TABLET BY MOUTH TWICE A DAY (Patient taking differently:  TAKE 1000 MG BY MOUTH TWICE A DAY) 180 tablet 0  . oxyCODONE-acetaminophen (PERCOCET) 10-325 MG tablet Take 1 tablet by mouth 4 (four) times daily as needed for pain.     . pravastatin (PRAVACHOL) 20 MG tablet TAKE 1 TABLET BY MOUTH EVERY EVENING (Patient taking differently: TAKE 20mg  BY MOUTH EVERY EVENING) 90 tablet 3   No current facility-administered medications for this visit.      Past Surgical History:  Procedure Laterality Date  . arm fx Left 11  . CATARACT EXTRACTION W/PHACO Right 09/26/2017   Procedure: CATARACT EXTRACTION PHACO AND INTRAOCULAR LENS PLACEMENT (IOC);  Surgeon: Tonny Shantale Holtmeyer, MD;  Location: [facility];  Service: Ophthalmology;  Laterality: Right;  CDE: 8.52  . CATARACT EXTRACTION W/PHACO Left 10/31/2017   Procedure: CATARACT EXTRACTION PHACO AND INTRAOCULAR LENS PLACEMENT LEFT EYE CDE=7.75;  Surgeon: Tonny Nigel Wessman, MD;  Location: [facility];  Service: Ophthalmology;   Laterality: Left;  left  . CORONARY ARTERY BYPASS GRAFT  07  . EYE SURGERY Right    cateracts  . FRACTURE SURGERY Bilateral    bilateral wrist and right knee orif's  . INSERTION OF MESH N/A 04/05/2016   Procedure: INSERTION OF MESH;  Surgeon: Rolm Bookbinder, MD;  Location: [place];  Service: General;  Laterality: N/A;  . MASTECTOMY  2010   lt-nodes  . ORIF ULNAR FRACTURE  10/01/2011   Procedure: OPEN REDUCTION INTERNAL FIXATION (ORIF) ULNAR FRACTURE;  Surgeon: Kerin Salen;  Location: [place];  Service: Orthopedics;  Laterality: Right;  and orif scaphoid  . UMBILICAL HERNIA REPAIR  04/05/2016   with 6.4 cm ventrallex mesh  . UMBILICAL HERNIA REPAIR N/A 04/05/2016   Procedure: UMBILICAL HERNIA REPAIR;  Surgeon: Rolm Bookbinder, MD;  Location: [place];  Service: General;  Laterality: N/A;     No Known Allergies    Family History  Problem Relation Age of Onset  . Cancer Mother        breast   . Stroke Mother   . Diabetes Mother   . Hypertension Mother   . Heart attack Father   . Diabetes Sister   . Lung cancer Brother   . Heart disease Brother      Social History [name] reports that  has never smoked. she has never used smokeless tobacco. [name] reports that she does not drink alcohol.   Review of Systems CONSTITUTIONAL: No weight loss, fever, chills, weakness or fatigue.  HEENT: Eyes: No visual loss, blurred vision, double vision or yellow sclerae.No hearing loss, sneezing, congestion, runny nose or sore throat.  SKIN: No rash or itching.  CARDIOVASCULAR: per hpi RESPIRATORY: No shortness of breath, cough or sputum.  GASTROINTESTINAL: No anorexia, nausea, vomiting or diarrhea. No abdominal pain or blood.  GENITOURINARY: No burning on urination, no polyuria NEUROLOGICAL: No headache, dizziness, syncope, paralysis, ataxia, numbness or tingling in the extremities. No change in bowel or bladder control.  MUSCULOSKELETAL: No muscle, back pain, joint pain or stiffness.    LYMPHATICS: No enlarged nodes. No history of splenectomy.  PSYCHIATRIC: No history of depression or anxiety.  ENDOCRINOLOGIC: No reports of sweating, cold or heat intolerance. No polyuria or polydipsia.  Marland Kitchen   Physical Examination Vitals:   01/09/18 1554  BP: 124/62  Pulse: (!) 37  SpO2: 98%   Vitals:   01/09/18 1554  Weight: 142 lb 12.8 oz (64.8 kg)  Height: 5\' 4"  (1.626 m)    Gen: resting comfortably, no acute distress HEENT: no scleral icterus, pupils equal round  and reactive, no palptable cervical adenopathy,  CV: RRR, no m/r/g, no jvd Resp: Clear to auscultation bilaterally GI: abdomen is soft, non-tender, non-distended, normal bowel sounds, no hepatosplenomegaly MSK: extremities are warm, no edema.  Skin: warm, no rash Neuro:  no focal deficits Psych: appropriate affect   Diagnostic Studies 12/2014 echo Study Conclusions  - Left ventricle: The cavity size was normal. Systolic function was normal. The estimated ejection fraction was in the range of 50% to 55%. Very mild concentric hypertrophy. Posterior wall measurement is overestimated. Wall motion was normal; there were no regional wall motion abnormalities. Doppler parameters are consistent with abnormal left ventricular relaxation (grade 1 diastolic dysfunction). Doppler parameters are consistent with high ventricular filling pressure. - Mitral valve: There was mild regurgitation. - Left atrium: The atrium was mildly dilated. Volume/bsa, S: 30.5 ml/m^2. - Right ventricle: Systolic function was mildly reduced. - Tricuspid valve: There was mild regurgitation.   05/2016 carotid US Elevated velocities RICA likely due to tortous vessel.    02/2016 Nuclear stress  There was no ST segment deviation noted during stress.  Findings consistent with prior small inferoseptal myocardial infarction with no current peri-infarct ischemia.  This is a low risk study. There is no myocardium currently  at jeopardy.  The left ventricular ejection fraction is hyperdynamic (>65%).     Assessment and Plan  1. CAD - no recent symptoms ,continue current meds   2. HTN - at goal, continue current meds  3. HL -did not tolerate high dose statin, now on pravastatin 20mg  daily - she will continue pravastatin  4. PVCs - EKG in clinic today shows bigeminy. Overall asymptomatic - obtain 24 hr holter to evaluate PVC burden. If high burden will obtain further testing to evaluate for underlying heart disease with echo and possibly stress test.  - dynamap heart rates falsesly low due to PVCs.   F/u pending test results    Arnoldo Lenis, M.D.

## 2018-01-09 NOTE — Patient Instructions (Signed)
Your physician recommends that you schedule a follow-up appointment in: TO BE DETERMINED WITH DR BRANCH  Your physician recommends that you continue on your current medications as directed. Please refer to the Current Medication list given to you today.  Your physician has recommended that you wear a holter monitor FOR 24 HOURS. Holter monitors are medical devices that record the heart's electrical activity. Doctors most often use these monitors to diagnose arrhythmias. Arrhythmias are problems with the speed or rhythm of the heartbeat. The monitor is a small, portable device. You can wear one while you do your normal daily activities. This is usually used to diagnose what is causing palpitations/syncope (passing out).  Thank you for choosing [facility]!!      

## 2018-01-10 DIAGNOSIS — I2510 Atherosclerotic heart disease of native coronary artery without angina pectoris: Secondary | ICD-10-CM | POA: Diagnosis not present

## 2018-01-13 ENCOUNTER — Encounter: Payer: Self-pay | Admitting: Cardiology

## 2018-01-20 ENCOUNTER — Telehealth: Payer: Self-pay

## 2018-01-20 DIAGNOSIS — I2510 Atherosclerotic heart disease of native coronary artery without angina pectoris: Secondary | ICD-10-CM

## 2018-01-20 NOTE — Telephone Encounter (Signed)
-----   Message from Arnoldo Lenis, MD sent at 01/20/2018  3:53 PM EST ----- Heart monitor shows frequent extra heart beats. We need to look for any changes in her heart that may be leading to this. Please order an echo for CAD, pending results we likelty will need to consider a stress test   J BrancH MD

## 2018-01-20 NOTE — Telephone Encounter (Signed)
Daughter notified, copied pcp, echo order placed

## 2018-01-21 ENCOUNTER — Other Ambulatory Visit: Payer: Self-pay | Admitting: Family Medicine

## 2018-01-26 ENCOUNTER — Ambulatory Visit (HOSPITAL_COMMUNITY)
Admission: RE | Admit: 2018-01-26 | Discharge: 2018-01-26 | Disposition: A | Discharge status: Home / Self Care | Payer: Medicare Other | Source: Ambulatory Visit | Attending: Cardiology | Admitting: Cardiology

## 2018-01-26 DIAGNOSIS — E785 Hyperlipidemia, unspecified: Secondary | ICD-10-CM | POA: Insufficient documentation

## 2018-01-26 DIAGNOSIS — Z8673 Personal history of transient ischemic attack (TIA), and cerebral infarction without residual deficits: Secondary | ICD-10-CM | POA: Insufficient documentation

## 2018-01-26 DIAGNOSIS — I2510 Atherosclerotic heart disease of native coronary artery without angina pectoris: Secondary | ICD-10-CM | POA: Insufficient documentation

## 2018-01-26 DIAGNOSIS — Z853 Personal history of malignant neoplasm of breast: Secondary | ICD-10-CM | POA: Diagnosis not present

## 2018-01-26 DIAGNOSIS — E119 Type 2 diabetes mellitus without complications: Secondary | ICD-10-CM | POA: Insufficient documentation

## 2018-01-26 DIAGNOSIS — I4510 Unspecified right bundle-branch block: Secondary | ICD-10-CM | POA: Diagnosis not present

## 2018-01-26 NOTE — Progress Notes (Signed)
*  PRELIMINARY RESULTS* Echocardiogram 2D Echocardiogram has been performed.  [name] 01/26/2018, 3:22 PM

## 2018-01-31 ENCOUNTER — Telehealth: Payer: Self-pay | Admitting: Cardiology

## 2018-01-31 ENCOUNTER — Other Ambulatory Visit: Payer: Self-pay | Admitting: Family Medicine

## 2018-01-31 NOTE — Telephone Encounter (Signed)
[name] -daughter called checking on results of recent echo - please call [phone] .

## 2018-01-31 NOTE — Telephone Encounter (Signed)
Returned pt call. No answer, left message for pt to return call.  

## 2018-01-31 NOTE — Telephone Encounter (Signed)
Results of Echo / tg  °

## 2018-01-31 NOTE — Telephone Encounter (Signed)
[name]. Working on this from [employer].

## 2018-02-01 NOTE — Telephone Encounter (Signed)
Called pt. Non answer, left message for pt to return call.  

## 2018-02-01 NOTE — Telephone Encounter (Signed)
-----   Message from Arnoldo Lenis, MD sent at 01/31/2018 12:25 PM EDT ----- Echo looks good, no new weakness in the heart. Due to her having so many frequent heart beats (PVCs) I would like to do one additional test to look for any new blockages that may be the cause. Can we order a lexiscan for abnormal EKG, CAD>   Zandra Abts MD

## 2018-02-06 ENCOUNTER — Telehealth: Payer: Self-pay

## 2018-02-06 DIAGNOSIS — I493 Ventricular premature depolarization: Secondary | ICD-10-CM

## 2018-02-06 NOTE — Telephone Encounter (Addendum)
-----   Message from Arnoldo Lenis, MD sent at 01/31/2018 12:26 PM EDT ----- Can we also repeat a BMET/Mg/TSH, changes in these could also cause PVCs   Zandra Abts MD  Notes recorded by Arnoldo Lenis, MD on 01/31/2018 at 12:25 PM EDT Echo looks good, no new weakness in the heart. Due to her having so many frequent heart beats (PVCs) I would like to do one additional test to look for any new blockages that may be the cause. Can we order a lexiscan for abnormal EKG, CAD>

## 2018-02-06 NOTE — Telephone Encounter (Signed)
I spoke with patient and daughter, gave daughter instructions for lexiscan, mailed instructions and lab slip to their home

## 2018-02-07 ENCOUNTER — Ambulatory Visit (INDEPENDENT_AMBULATORY_CARE_PROVIDER_SITE_OTHER): Payer: Medicare Other | Admitting: Family Medicine

## 2018-02-07 ENCOUNTER — Encounter: Payer: Self-pay | Admitting: Family Medicine

## 2018-02-07 ENCOUNTER — Ambulatory Visit (INDEPENDENT_AMBULATORY_CARE_PROVIDER_SITE_OTHER): Payer: Medicare Other

## 2018-02-07 VITALS — BP 122/58 | HR 62 | Temp 96.5°F | Ht 64.0 in | Wt 147.4 lb

## 2018-02-07 DIAGNOSIS — R0781 Pleurodynia: Secondary | ICD-10-CM

## 2018-02-07 DIAGNOSIS — I6523 Occlusion and stenosis of bilateral carotid arteries: Secondary | ICD-10-CM

## 2018-02-07 NOTE — Progress Notes (Signed)
   HPI  Patient presents today here with continued rib pain after a fall.  Patient states that she fell and was seen in urgent care 4 days ago.  She states that she was looking at a new car with 1 of her granddaughters whenever she turned a corner and tripped over her left foot.  She states that she landed on iron on her left ribs and was seen in urgent care later that day.  She was given Toradol injection and 800 mg ibuprofen.  States the Toradol was somewhat effective, however ibuprofen does not seem to be helping.  She is on oxycodone via the pain management clinic.   PMH: Smoking status noted ROS: Per HPI  Objective: BP (!) 122/58   Pulse 62   Temp (!) 96.5 F (35.8 C) (Oral)   Ht 5\' 4"  (1.626 m)   Wt 147 lb 6.4 oz (66.9 kg)   BMI 25.30 kg/m  Gen: NAD, alert, cooperative with exam HEENT: NCAT, EOMI, PERRL CV: RRR, good S1/S2, no murmur Chest wall- TTP over L 7th to 8th ribs Resp: CTABL, no wheezes, non-labored Ext: No edema, warm Neuro: Alert and oriented, No gross deficits  Assessment and plan:  # Rib epain, L chest wall pain Likely rib Fx 7-8th rib approx, pending radiology read, s/p fall 4 days ago.  Discussed limit NSAIDs with CAD s/p CABG Discussed supportive care RTC with any concerns    Orders Placed This Encounter  Procedures  . DG Ribs Unilateral W/Chest Left    Standing Status:   Future    Number of Occurrences:   1    Standing Expiration Date:   04/09/2019    Order Specific Question:   Reason for Exam (SYMPTOM  OR DIAGNOSIS REQUIRED)    Answer:   fall    Order Specific Question:   Preferred imaging location?    Answer:   Internal    Laroy Apple, MD [facility] 02/07/2018, 11:19 AM

## 2018-02-10 ENCOUNTER — Other Ambulatory Visit: Payer: Self-pay | Admitting: Family Medicine

## 2018-02-10 DIAGNOSIS — R911 Solitary pulmonary nodule: Secondary | ICD-10-CM

## 2018-02-14 ENCOUNTER — Encounter: Payer: Self-pay | Admitting: Family Medicine

## 2018-02-14 ENCOUNTER — Ambulatory Visit (INDEPENDENT_AMBULATORY_CARE_PROVIDER_SITE_OTHER): Payer: Medicare Other | Admitting: Family Medicine

## 2018-02-14 VITALS — BP 158/80 | HR 64 | Temp 97.0°F | Ht 64.0 in | Wt 152.4 lb

## 2018-02-14 DIAGNOSIS — I6523 Occlusion and stenosis of bilateral carotid arteries: Secondary | ICD-10-CM

## 2018-02-14 DIAGNOSIS — M7989 Other specified soft tissue disorders: Secondary | ICD-10-CM

## 2018-02-14 DIAGNOSIS — I10 Essential (primary) hypertension: Secondary | ICD-10-CM | POA: Diagnosis not present

## 2018-02-14 DIAGNOSIS — E875 Hyperkalemia: Secondary | ICD-10-CM

## 2018-02-14 MED ORDER — HYDROCHLOROTHIAZIDE 25 MG PO TABS: 12.5000 mg | ORAL_TABLET | Freq: Every day | ORAL | 1 refills | Status: DC

## 2018-02-14 NOTE — Patient Instructions (Addendum)
Great to see you!   

## 2018-02-14 NOTE — Progress Notes (Signed)
   HPI  Patient presents today here with leg and foot swelling.  Patient reports bilateral leg and foot swelling for about 1 week. She states that this came on without obvious cause.  She denies chest pain, or diet changes.  She was diagnosed with rib fractures x2 about 1 week ago states that she has not been quite as active as usual, however almost to normal.  Patient states she is been taking 800 mg of ibuprofen without much reward regularly.  PMH: Smoking status noted ROS: Per HPI  Objective: BP (!) 158/80   Pulse 64   Temp (!) 97 F (36.1 C) (Oral)   Ht '5\' 4"'$  (1.626 m)   Wt 152 lb 6.4 oz (69.1 kg)   BMI 26.16 kg/m  Gen: NAD, alert, cooperative with exam HEENT: NCAT CV: RRR, good S1/S2, no murmur Resp: CTABL, no wheezes, non-labored Ext: 1+ pitting edema bilateral lower extremities symmetric Neuro: Alert and oriented, No gross deficits  Assessment and plan:  #leg Swelling, hypertension Patient with elevated blood pressure today, also with leg swelling Likely NSAID induced BMP  HTN Elevated, Also likely NSAID induced- DC NSAID   Orders Placed This Encounter  Procedures  . BMP8+EGFR    Meds ordered this encounter  Medications  . hydrochlorothiazide (HYDRODIURIL) 25 MG tablet    Sig: Take 0.5 tablets (12.5 mg total) by mouth daily.    Dispense:  30 tablet    Refill:  [place], MD [facility] 02/14/2018, 3:52 PM

## 2018-02-15 LAB — BMP8+EGFR
BUN/Creatinine Ratio: 16 (ref 12–28)
BUN: 14 mg/dL (ref 8–27)
CO2: 22 mmol/L (ref 20–29)
CREATININE: 0.85 mg/dL (ref 0.57–1.00)
Calcium: 10.3 mg/dL (ref 8.7–10.3)
Chloride: 106 mmol/L (ref 96–106)
GFR calc Af Amer: 83 mL/min/{1.73_m2} (ref >59)
GFR, EST NON AFRICAN AMERICAN: 72 mL/min/{1.73_m2} (ref >59)
Glucose: 136 mg/dL — ABNORMAL HIGH (ref 65–99)
Potassium: 5.7 mmol/L — ABNORMAL HIGH (ref 3.5–5.2)
SODIUM: 141 mmol/L (ref 134–144)

## 2018-02-16 ENCOUNTER — Encounter (HOSPITAL_COMMUNITY)
Admission: RE | Admit: 2018-02-16 | Discharge: 2018-02-16 | Disposition: A | Discharge status: Home / Self Care | Payer: Medicare Other | Source: Ambulatory Visit | Attending: Cardiology | Admitting: Cardiology

## 2018-02-16 ENCOUNTER — Encounter (HOSPITAL_COMMUNITY): Payer: Self-pay

## 2018-02-16 ENCOUNTER — Other Ambulatory Visit: Payer: Self-pay | Admitting: *Deleted

## 2018-02-16 ENCOUNTER — Encounter (HOSPITAL_BASED_OUTPATIENT_CLINIC_OR_DEPARTMENT_OTHER)
Admission: RE | Admit: 2018-02-16 | Discharge: 2018-02-16 | Disposition: A | Discharge status: Home / Self Care | Payer: Medicare Other | Source: Ambulatory Visit | Attending: Cardiology | Admitting: Cardiology

## 2018-02-16 DIAGNOSIS — I493 Ventricular premature depolarization: Secondary | ICD-10-CM | POA: Insufficient documentation

## 2018-02-16 LAB — NM MYOCAR MULTI W/SPECT W/WALL MOTION / EF
CHL CUP NUCLEAR SRS: 7
CHL CUP RESTING HR STRESS: 77 {beats}/min
LV dias vol: 74 mL (ref 46–106)
LVSYSVOL: 44 mL
NUC STRESS TID: 0.88
Peak HR: 89 {beats}/min
RATE: 0.5
SDS: 2
SSS: 9

## 2018-02-16 MED ORDER — TECHNETIUM TC 99M TETROFOSMIN IV KIT
10.0000 | PACK | Freq: Once | INTRAVENOUS | Status: AC | PRN
  Administered 2018-02-16: 11 via INTRAVENOUS

## 2018-02-16 MED ORDER — TECHNETIUM TC 99M TETROFOSMIN IV KIT
30.0000 | PACK | Freq: Once | INTRAVENOUS | Status: AC | PRN
  Administered 2018-02-16: 31 via INTRAVENOUS

## 2018-02-16 MED ORDER — SODIUM CHLORIDE 0.9% FLUSH
INTRAVENOUS | Status: AC
  Administered 2018-02-16: 10 mL via INTRAVENOUS
  Filled 2018-02-16: qty 10

## 2018-02-16 MED ORDER — REGADENOSON 0.4 MG/5ML IV SOLN
INTRAVENOUS | Status: AC
  Administered 2018-02-16: 0.4 mg via INTRAVENOUS
  Filled 2018-02-16: qty 5

## 2018-02-20 ENCOUNTER — Telehealth: Payer: Self-pay | Admitting: *Deleted

## 2018-02-20 NOTE — Telephone Encounter (Signed)
Pt aware and voiced understanding - f/u appt scheduled - routed to pcp

## 2018-02-20 NOTE — Telephone Encounter (Signed)
-----   Message from Arnoldo Lenis, MD sent at 02/20/2018  2:15 PM EDT ----- Stress test looks good, no evidence of significant blockages. F/u in 2 months    J BrancH MD

## 2018-02-21 ENCOUNTER — Telehealth: Payer: Self-pay | Admitting: Family Medicine

## 2018-02-21 NOTE — Telephone Encounter (Signed)
Refer to lab notes 

## 2018-02-21 NOTE — Addendum Note (Signed)
Addended by: Nigel Berthold C on: 02/21/2018 12:08 PM   Modules accepted: Orders

## 2018-02-23 ENCOUNTER — Other Ambulatory Visit: Payer: Medicare Other

## 2018-02-23 DIAGNOSIS — E875 Hyperkalemia: Secondary | ICD-10-CM

## 2018-02-24 LAB — BMP8+EGFR
BUN / CREAT RATIO: 16 (ref 12–28)
BUN: 14 mg/dL (ref 8–27)
CHLORIDE: 108 mmol/L — AB (ref 96–106)
CO2: 21 mmol/L (ref 20–29)
CREATININE: 0.85 mg/dL (ref 0.57–1.00)
Calcium: 9.8 mg/dL (ref 8.7–10.3)
GFR calc Af Amer: 83 mL/min/{1.73_m2} (ref >59)
GFR calc non Af Amer: 72 mL/min/{1.73_m2} (ref >59)
GLUCOSE: 124 mg/dL — AB (ref 65–99)
Potassium: 5.4 mmol/L — ABNORMAL HIGH (ref 3.5–5.2)
SODIUM: 142 mmol/L (ref 134–144)

## 2018-02-28 ENCOUNTER — Encounter: Payer: Self-pay | Admitting: Family Medicine

## 2018-03-23 ENCOUNTER — Other Ambulatory Visit: Payer: Self-pay | Admitting: Family Medicine

## 2018-03-23 ENCOUNTER — Encounter: Payer: Self-pay | Admitting: Family Medicine

## 2018-03-23 ENCOUNTER — Ambulatory Visit (INDEPENDENT_AMBULATORY_CARE_PROVIDER_SITE_OTHER): Payer: Medicare Other | Admitting: Family Medicine

## 2018-03-23 VITALS — BP 123/57 | HR 73 | Temp 98.1°F | Ht 64.0 in | Wt 144.8 lb

## 2018-03-23 DIAGNOSIS — E119 Type 2 diabetes mellitus without complications: Secondary | ICD-10-CM

## 2018-03-23 DIAGNOSIS — I10 Essential (primary) hypertension: Secondary | ICD-10-CM

## 2018-03-23 DIAGNOSIS — I6523 Occlusion and stenosis of bilateral carotid arteries: Secondary | ICD-10-CM

## 2018-03-23 DIAGNOSIS — E1159 Type 2 diabetes mellitus with other circulatory complications: Secondary | ICD-10-CM | POA: Diagnosis not present

## 2018-03-23 HISTORY — DX: Type 2 diabetes mellitus without complications: E11.9

## 2018-03-23 LAB — BAYER DCA HB A1C WAIVED: HB A1C: 7.6 % — AB (ref <7.0)

## 2018-03-23 MED ORDER — FREESTYLE LIBRE 14 DAY SENSOR MISC: 1.0000 | 3 refills | Status: DC

## 2018-03-23 MED ORDER — DULOXETINE HCL 30 MG PO CPEP: 30.0000 mg | ORAL_CAPSULE | Freq: Every day | ORAL | 3 refills | Status: DC

## 2018-03-23 MED ORDER — FREESTYLE LIBRE 14 DAY READER DEVI: 1.0000 | Freq: Every day | 0 refills | Status: DC | PRN

## 2018-03-23 NOTE — Progress Notes (Signed)
   HPI  Patient presents today for follow-up chronic medical conditions.  Type 2 diabetes Patient feels like everything is going well, reports very good medication compliance and can describe her insulin regimen well. Fasting blood sugar 130-150, postprandial 100 4160. Hypoglycemia rare, has good awareness.  Hypercalcemia Asymptomatic No calcium supplements.  Hypertension Doing well with current medications. Has done better since off of NSAIDs  PMH: Smoking status noted ROS: Per HPI  Objective: BP (!) 123/57   Pulse 73   Temp 98.1 F (36.7 C) (Oral)   Ht '5\' 4"'$  (1.626 m)   Wt 144 lb 12.8 oz (65.7 kg)   BMI 24.85 kg/m  Gen: NAD, alert, cooperative with exam HEENT: NCAT CV: RRR, good S1/S2, no murmur Resp: CTABL, no wheezes, non-labored Ext: 1+ pitting edema bilateral lower extremities, symmetric Neuro: Alert and oriented, No gross deficits  Assessment and plan:  #Type 2 diabetes Expect good control, A1c pending Continue current regimen including basal insulin, NovoLog, metformin Attempting to get Freestyle libre  #Hypercalcemia No supplements, asymptomatic Repeat labs with PTH  #Hypertension Well-controlled No changes, if potassium is still borderline consider discontinuing lisinopril and starting HCTZ    Orders Placed This Encounter  Procedures  . Bayer DCA Hb A1c Waived  . CMP14+EGFR  . PTH, Intact and Calcium  . Lipid panel    Meds ordered this encounter  Medications  . DULoxetine (CYMBALTA) 30 MG capsule    Sig: Take 1 capsule (30 mg total) by mouth daily.    Dispense:  90 capsule    Refill:  [place], MD [place] 03/23/2018, 11:06 AM

## 2018-03-24 ENCOUNTER — Other Ambulatory Visit: Payer: Self-pay | Admitting: Family Medicine

## 2018-03-24 LAB — CMP14+EGFR
ALT: 15 IU/L (ref 0–32)
AST: 21 IU/L (ref 0–40)
Albumin/Globulin Ratio: 1.6 (ref 1.2–2.2)
Albumin: 3.7 g/dL (ref 3.6–4.8)
Alkaline Phosphatase: 109 IU/L (ref 39–117)
BUN/Creatinine Ratio: 33 — ABNORMAL HIGH (ref 12–28)
BUN: 26 mg/dL (ref 8–27)
Bilirubin Total: 0.3 mg/dL (ref 0.0–1.2)
CALCIUM: 9.6 mg/dL (ref 8.7–10.3)
CO2: 20 mmol/L (ref 20–29)
CREATININE: 0.8 mg/dL (ref 0.57–1.00)
Chloride: 108 mmol/L — ABNORMAL HIGH (ref 96–106)
GFR calc Af Amer: 89 mL/min/{1.73_m2} (ref >59)
GFR, EST NON AFRICAN AMERICAN: 78 mL/min/{1.73_m2} (ref >59)
Globulin, Total: 2.3 g/dL (ref 1.5–4.5)
Glucose: 128 mg/dL — ABNORMAL HIGH (ref 65–99)
Potassium: 5.4 mmol/L — ABNORMAL HIGH (ref 3.5–5.2)
Sodium: 140 mmol/L (ref 134–144)
Total Protein: 6 g/dL (ref 6.0–8.5)

## 2018-03-24 LAB — LIPID PANEL
CHOLESTEROL TOTAL: 127 mg/dL (ref 100–199)
Chol/HDL Ratio: 2 ratio (ref 0.0–4.4)
HDL: 63 mg/dL (ref >39)
LDL Calculated: 56 mg/dL (ref 0–99)
Triglycerides: 42 mg/dL (ref 0–149)
VLDL CHOLESTEROL CAL: 8 mg/dL (ref 5–40)

## 2018-03-24 LAB — PTH, INTACT AND CALCIUM: PTH: 102 pg/mL — ABNORMAL HIGH (ref 15–65)

## 2018-03-24 NOTE — Telephone Encounter (Signed)
Attempted to contact patient - NA °

## 2018-03-24 NOTE — Telephone Encounter (Signed)
Freestyle libre not covered by insurance.   Laroy Apple, MD [facility] 03/24/2018, 4:42 PM

## 2018-03-28 ENCOUNTER — Other Ambulatory Visit: Payer: Self-pay | Admitting: Family Medicine

## 2018-03-28 NOTE — Telephone Encounter (Signed)
Last seen 03/23/18

## 2018-03-29 NOTE — Telephone Encounter (Signed)
Aware, Elenor Legato not covered by insurance.

## 2018-04-12 ENCOUNTER — Ambulatory Visit: Payer: Medicare Other | Admitting: Physical Therapy

## 2018-04-20 ENCOUNTER — Telehealth: Payer: Self-pay

## 2018-04-20 DIAGNOSIS — E1159 Type 2 diabetes mellitus with other circulatory complications: Secondary | ICD-10-CM

## 2018-04-20 NOTE — Telephone Encounter (Signed)
Referral placed.   Laroy Apple, MD [facility] 04/20/2018, 5:02 PM

## 2018-04-20 NOTE — Telephone Encounter (Signed)
Daughter said she got a letter in mail and its OK to refer her mother to Endocrinology

## 2018-04-22 ENCOUNTER — Other Ambulatory Visit: Payer: Self-pay | Admitting: Family Medicine

## 2018-04-25 ENCOUNTER — Ambulatory Visit (INDEPENDENT_AMBULATORY_CARE_PROVIDER_SITE_OTHER): Payer: Medicare Other | Admitting: Cardiology

## 2018-04-25 ENCOUNTER — Encounter: Payer: Self-pay | Admitting: Cardiology

## 2018-04-25 VITALS — BP 115/58 | HR 56 | Ht 64.0 in | Wt 149.4 lb

## 2018-04-25 DIAGNOSIS — I2510 Atherosclerotic heart disease of native coronary artery without angina pectoris: Secondary | ICD-10-CM

## 2018-04-25 DIAGNOSIS — I493 Ventricular premature depolarization: Secondary | ICD-10-CM

## 2018-04-25 DIAGNOSIS — I6523 Occlusion and stenosis of bilateral carotid arteries: Secondary | ICD-10-CM | POA: Diagnosis not present

## 2018-04-25 DIAGNOSIS — I10 Essential (primary) hypertension: Secondary | ICD-10-CM | POA: Diagnosis not present

## 2018-04-25 DIAGNOSIS — E782 Mixed hyperlipidemia: Secondary | ICD-10-CM | POA: Diagnosis not present

## 2018-04-25 MED ORDER — LISINOPRIL 5 MG PO TABS: 5.0000 mg | ORAL_TABLET | Freq: Every day | ORAL | 1 refills | Status: DC

## 2018-04-25 MED ORDER — METOPROLOL TARTRATE 25 MG PO TABS: 12.5000 mg | ORAL_TABLET | Freq: Two times a day (BID) | ORAL | 1 refills | Status: DC

## 2018-04-25 NOTE — Patient Instructions (Signed)
Your physician wants you to follow-up in: [facility] [place] will receive a reminder letter in the mail two months in advance. If you don't receive a letter, please call our office to schedule the follow-up appointment.  Your physician has recommended you make the following change in your medication:   START METOPROLOL 12.5 MG (1/2 TABLET) TWICE DAILY  DECREASE LISINOPRIL 5 MG DAILY   Thank you for choosing [facility]!!

## 2018-04-25 NOTE — Progress Notes (Signed)
Clinical Summary [name] is a 67 y.o.female seen today for follow up of the following medical problems.   1. CAD - hx of CABG in 2007, normal LVEF 60% - 02/2016 she had nuclear stress without clear ischemia.   - no recent chest pain. No SOB or DOe - compliant with meds  2. HTN - compliant with meds   3. HL - she stopped lipitor due to dizziness.  -  started pravastatin 20mg  daily, tolerating well  - lipids6/2018 TC 140 TG 37 HDL 69 LDL 64  4. Hx of CVA - she is on both ASA and plavix, she is unsure exactly why. Assume its related to her hx of CVA as no clear cardiac indication to be on plavix from records   5. Syncope - seen in ER 05/21/17 - initially low bp's by EMS, resolved with IVFs -elevated Cr and BUN from baseline supporting dehydration. - significant abdominal painat the time, CT showed severe constipation  - no recent episodes  6. PVCs - noted at recent pcp visit.  - denies any palpitations.  - K 5.1 - 1-2 cups of coffee. No other caffeine   12/2017 holter with 44% ventricular burden, short runs of NSVT up to 5 beats - 01/2018 echo LVEF 50-55%, no WMAs - 02/2018 nuclear stress: no clear ischemia. Possible mid to apical anterior and basal inferoseptal scar.  - denies any palpitations    SH: husbandand a brotherpassed recently    Past Medical History:  Diagnosis Date  . Anxiety    no rx  . Arthritis   . Cancer [facility])    Left mastectomy, 2/11  . Carotid disease, bilateral ([place])    Followed by Dr. Leonie Man  . Carotid disease, bilateral ([place]) 10/21/2011  . Coronary artery disease    cabg 2007  . Diabetes mellitus   . HLD (hyperlipidemia)   . Hypertension   . Neuromuscular disorder ([place])   . Neuropathic pain, leg   . Stroke [facility])    more than 10 yrs weakness rt leg  . Stroke, lacunar ([place])    Followed by Dr. Leonie Man     No Known Allergies   Current Outpatient Medications  Medication Sig Dispense Refill  . aspirin 81 MG  chewable tablet Chew 81 mg by mouth daily.      . Cholecalciferol (VITAMIN D PO) Take by mouth.    . Cholecalciferol (VITAMIN D) 2000 units CAPS Take by mouth.    . clopidogrel (PLAVIX) 75 MG tablet TAKE 1 TABLET BY MOUTH EVERY DAY 90 tablet 0  . Continuous Blood Gluc Receiver (FREESTYLE LIBRE 14 DAY READER) DEVI 1 Device by Does not apply route daily as needed. 1 Device 0  . Continuous Blood Gluc Sensor (FREESTYLE LIBRE 14 DAY SENSOR) MISC USE TO CHECK SUGAR 1 each 2  . DULoxetine (CYMBALTA) 30 MG capsule Take 1 capsule (30 mg total) by mouth daily. 90 capsule 3  . folic acid (FOLVITE) 1 MG tablet Take 1 tablet (1 mg total) by mouth daily. 90 tablet 3  . gabapentin (NEURONTIN) 400 MG capsule TAKE 1 CAPSULE BY MOUTH THREE TIMES A DAY 270 capsule 0  . glucose blood (ACCU-CHEK AVIVA PLUS) test strip Use to check blood sugars TID 300 each 3  . ibuprofen (ADVIL,MOTRIN) 200 MG tablet Take 400 mg by mouth every 8 (eight) hours as needed for headache or moderate pain.    Marland Kitchen ibuprofen (IBU) 800 MG tablet Take 800 mg by mouth every 8 (  eight) hours as needed.    . insulin aspart (NOVOLOG) 100 UNIT/ML injection 3 units twice daily just before lunch and dinner 10 mL   . insulin degludec (TRESIBA FLEXTOUCH) 100 UNIT/ML SOPN FlexTouch Pen Inject 0.16 mLs (16 Units total) into the skin daily. INJECT 0.2 MLS (20 UNITS TOTAL) INTO THE SKIN DAILY AT 10 AM 15 pen 2  . lisinopril (PRINIVIL,ZESTRIL) 10 MG tablet Take 1 tablet (10 mg total) by mouth daily. 90 tablet 3  . metFORMIN (GLUCOPHAGE) 1000 MG tablet TAKE 1 TABLET BY MOUTH TWICE A DAY (Patient taking differently: TAKE 1000 MG BY MOUTH TWICE A DAY) 180 tablet 0  . oxyCODONE-acetaminophen (PERCOCET) 10-325 MG tablet Take 1 tablet by mouth 4 (four) times daily as needed for pain.     . pravastatin (PRAVACHOL) 20 MG tablet TAKE 1 TABLET BY MOUTH EVERY EVENING (Patient taking differently: TAKE 20mg  BY MOUTH EVERY EVENING) 90 tablet 3  . TRESIBA FLEXTOUCH 100 UNIT/ML  SOPN FlexTouch Pen INJECT 0.2 MLS (20 UNITS TOTAL) INTO THE SKIN DAILY AT 10 PM. 15 pen 1   No current facility-administered medications for this visit.      Past Surgical History:  Procedure Laterality Date  . arm fx Left 11  . CATARACT EXTRACTION W/PHACO Right 09/26/2017   Procedure: CATARACT EXTRACTION PHACO AND INTRAOCULAR LENS PLACEMENT (IOC);  Surgeon: Tonny Talayla Doyel, MD;  Location: [facility];  Service: Ophthalmology;  Laterality: Right;  CDE: 8.52  . CATARACT EXTRACTION W/PHACO Left 10/31/2017   Procedure: CATARACT EXTRACTION PHACO AND INTRAOCULAR LENS PLACEMENT LEFT EYE CDE=7.75;  Surgeon: Tonny Lexie Morini, MD;  Location: [facility];  Service: Ophthalmology;  Laterality: Left;  left  . CORONARY ARTERY BYPASS GRAFT  07  . EYE SURGERY Right    cateracts  . FRACTURE SURGERY Bilateral    bilateral wrist and right knee orif's  . INSERTION OF MESH N/A 04/05/2016   Procedure: INSERTION OF MESH;  Surgeon: Rolm Bookbinder, MD;  Location: [facility];  Service: General;  Laterality: N/A;  . MASTECTOMY  2010   lt-nodes  . ORIF ULNAR FRACTURE  10/01/2011   Procedure: OPEN REDUCTION INTERNAL FIXATION (ORIF) ULNAR FRACTURE;  Surgeon: Kerin Salen;  Location: [facility];  Service: Orthopedics;  Laterality: Right;  and orif scaphoid  . UMBILICAL HERNIA REPAIR  04/05/2016   with 6.4 cm ventrallex mesh  . UMBILICAL HERNIA REPAIR N/A 04/05/2016   Procedure: UMBILICAL HERNIA REPAIR;  Surgeon: Rolm Bookbinder, MD;  Location: [facility];  Service: General;  Laterality: N/A;     No Known Allergies    Family History  Problem Relation Age of Onset  . Cancer Mother        breast   . Stroke Mother   . Diabetes Mother   . Hypertension Mother   . Heart attack Father   . Diabetes Sister   . Lung cancer Brother   . Heart disease Brother      Social History [name] reports that she has never smoked. She has never used smokeless tobacco. [name] reports that she does not drink alcohol.   Review of  Systems CONSTITUTIONAL: No weight loss, fever, chills, weakness or fatigue.  HEENT: Eyes: No visual loss, blurred vision, double vision or yellow sclerae.No hearing loss, sneezing, congestion, runny nose or sore throat.  SKIN: No rash or itching.  CARDIOVASCULAR: per hpi RESPIRATORY: per hpi GASTROINTESTINAL: No anorexia, nausea, vomiting or diarrhea. No abdominal pain or blood.  GENITOURINARY: No burning on urination, no polyuria NEUROLOGICAL:  No headache, dizziness, syncope, paralysis, ataxia, numbness or tingling in the extremities. No change in bowel or bladder control.  MUSCULOSKELETAL: No muscle, back pain, joint pain or stiffness.  LYMPHATICS: No enlarged nodes. No history of splenectomy.  PSYCHIATRIC: No history of depression or anxiety.  ENDOCRINOLOGIC: No reports of sweating, cold or heat intolerance. No polyuria or polydipsia.  Marland Kitchen   Physical Examination Vitals:   04/25/18 1007  BP: (!) 115/58  Pulse: (!) 56  SpO2: 99%   Vitals:   04/25/18 1007  Weight: 149 lb 6.4 oz (67.8 kg)  Height: 5\' 4"  (1.626 m)    Gen: resting comfortably, no acute distress HEENT: no scleral icterus, pupils equal round and reactive, no palptable cervical adenopathy,  CV: RRR, no m/r/g, no jvd Resp: Clear to auscultation bilaterally GI: abdomen is soft, non-tender, non-distended, normal bowel sounds, no hepatosplenomegaly MSK: extremities are warm, no edema.  Skin: warm, no rash Neuro:  no focal deficits Psych: appropriate affect   Diagnostic Studies 12/2014 echo Study Conclusions  - Left ventricle: The cavity size was normal. Systolic function was normal. The estimated ejection fraction was in the range of 50% to 55%. Very mild concentric hypertrophy. Posterior wall measurement is overestimated. Wall motion was normal; there were no regional wall motion abnormalities. Doppler parameters are consistent with abnormal left ventricular relaxation (grade 1 diastolic  dysfunction). Doppler parameters are consistent with high ventricular filling pressure. - Mitral valve: There was mild regurgitation. - Left atrium: The atrium was mildly dilated. Volume/bsa, S: 30.5 ml/m^2. - Right ventricle: Systolic function was mildly reduced. - Tricuspid valve: There was mild regurgitation.   05/2016 carotid US Elevated velocities RICA likely due to tortous vessel.    02/2016 Nuclear stress  There was no ST segment deviation noted during stress.  Findings consistent with prior small inferoseptal myocardial infarction with no current peri-infarct ischemia.  This is a low risk study. There is no myocardium currently at jeopardy.  The left ventricular ejection fraction is hyperdynamic (>65%).   12/2017 holter  Min HR 59, Max HR 122, Avg HR 81. Primary rhythm is sinus rhythm  No supraventricular ectopy  Very frequent ventricular ectopy (44%). In the the form of isolated PVCs, coupltes, bigeminy, trigeminy.Short runs of NSVT up to 5 beats  No diary submitted  No sustained arrhythmias  Assessment and Plan  1. CAD -no symptoms, continue current meds   2. HTN - at goal. Lower lisinopril to 5mg  daily to allow [place] to start lopressor for her frequent ventricular ectopy.   3. HL -did not tolerate high dose statin, now on pravastatin 20mg  daily -contineu current statin.   4.PVCs - high burden based on prior holter monitor. Recent echo LVEF 50-55%, recent nuclear stress without current ischemia. Possibly due to prior scar related to her history of CAD and prior CABG.  - no significant symptoms. Start lopressor 12.5mg  bid due to high burden, precent cardiomyoatphy. Low baseline heartrate but I think due to the degree of PVCs would try to get on low dose beta blocker if tolerates.  - dynamap heart rates falsesly low due to PVCs.    F/u 6 months      Arnoldo Lenis, M.D.,

## 2018-05-01 ENCOUNTER — Encounter: Payer: Self-pay | Admitting: Cardiology

## 2018-05-02 ENCOUNTER — Other Ambulatory Visit: Payer: Self-pay | Admitting: Family Medicine

## 2018-05-15 ENCOUNTER — Telehealth: Payer: Self-pay | Admitting: Family Medicine

## 2018-05-15 NOTE — Telephone Encounter (Signed)
FYI

## 2018-05-15 NOTE — Progress Notes (Deleted)
Subjective: CC: Hospital follow up PCP: Janora Norlander, DO LPF:[id number] [name] is a 67 y.o. female presenting to clinic today for:  1. Hospital follow up Patient hospitalized on *** for ***.  She continues to have weakness, ***.   ROS: Per HPI  No Known Allergies Past Medical History:  Diagnosis Date  . Anxiety    no rx  . Arthritis   . Cancer [facility])    Left mastectomy, 2/11  . Carotid disease, bilateral ([place])    Followed by Dr. Leonie Man  . Carotid disease, bilateral ([place]) 10/21/2011  . Coronary artery disease    cabg 2007  . Diabetes mellitus   . HLD (hyperlipidemia)   . Hypertension   . Neuromuscular disorder ([place])   . Neuropathic pain, leg   . Stroke [facility])    more than 10 yrs weakness rt leg  . Stroke, lacunar ([place])    Followed by Dr. Leonie Man    Current Outpatient Medications:  .  aspirin 81 MG chewable tablet, Chew 81 mg by mouth daily.  , Disp: , Rfl:  .  Cholecalciferol (VITAMIN D) 2000 units CAPS, Take 1 capsule by mouth daily. , Disp: , Rfl:  .  clopidogrel (PLAVIX) 75 MG tablet, TAKE 1 TABLET BY MOUTH EVERY DAY, Disp: 90 tablet, Rfl: 0 .  Continuous Blood Gluc Receiver (FREESTYLE LIBRE 14 DAY READER) DEVI, 1 Device by Does not apply route daily as needed., Disp: 1 Device, Rfl: 0 .  DULoxetine (CYMBALTA) 30 MG capsule, Take 1 capsule (30 mg total) by mouth daily., Disp: 90 capsule, Rfl: 3 .  folic acid (FOLVITE) 1 MG tablet, Take 1 tablet (1 mg total) by mouth daily., Disp: 90 tablet, Rfl: 3 .  gabapentin (NEURONTIN) 400 MG capsule, TAKE 1 CAPSULE BY MOUTH THREE TIMES A DAY, Disp: 270 capsule, Rfl: 0 .  glucose blood (ACCU-CHEK AVIVA PLUS) test strip, Use to check blood sugars TID, Disp: 300 each, Rfl: 3 .  insulin aspart (NOVOLOG) 100 UNIT/ML injection, Inject 4 Units into the skin. Before lunch and dinner, Disp: , Rfl:  .  insulin degludec (TRESIBA FLEXTOUCH) 100 UNIT/ML SOPN FlexTouch Pen, Inject 16 Units into the skin every morning. 10:00 am, Disp: , Rfl:    .  lisinopril (PRINIVIL,ZESTRIL) 5 MG tablet, Take 1 tablet (5 mg total) by mouth daily., Disp: 90 tablet, Rfl: 1 .  metFORMIN (GLUCOPHAGE) 1000 MG tablet, TAKE 1 TABLET BY MOUTH TWICE A DAY, Disp: 180 tablet, Rfl: 0 .  metoprolol tartrate (LOPRESSOR) 25 MG tablet, Take 0.5 tablets (12.5 mg total) by mouth 2 (two) times daily., Disp: 90 tablet, Rfl: 1 .  naproxen sodium (ALEVE) 220 MG tablet, Take 220 mg by mouth as needed., Disp: , Rfl:  .  oxyCODONE-acetaminophen (PERCOCET) 10-325 MG tablet, Take 1 tablet by mouth 4 (four) times daily as needed for pain. , Disp: , Rfl:  .  pravastatin (PRAVACHOL) 20 MG tablet, TAKE 1 TABLET BY MOUTH EVERY EVENING (Patient taking differently: TAKE 20mg  BY MOUTH EVERY EVENING), Disp: 90 tablet, Rfl: 3 Social History   Socioeconomic History  . Marital status: Widowed    Spouse name: Not on file  . Number of children: Not on file  . Years of education: Not on file  . Highest education level: Not on file  Occupational History  . Not on file  Social Needs  . Financial resource strain: Not on file  . Food insecurity:    Worry: Not on file    Inability:  Not on file  . Transportation needs:    Medical: Not on file    Non-medical: Not on file  Tobacco Use  . Smoking status: Never Smoker  . Smokeless tobacco: Never Used  Substance and Sexual Activity  . Alcohol use: No    Alcohol/week: 0.0 oz  . Drug use: No  . Sexual activity: Never    Birth control/protection: Post-menopausal  Lifestyle  . Physical activity:    Days per week: Not on file    Minutes per session: Not on file  . Stress: Not on file  Relationships  . Social connections:    Talks on phone: Not on file    Gets together: Not on file    Attends religious service: Not on file    Active member of club or organization: Not on file    Attends meetings of clubs or organizations: Not on file    Relationship status: Not on file  . Intimate partner violence:    Fear of current or ex  partner: Not on file    Emotionally abused: Not on file    Physically abused: Not on file    Forced sexual activity: Not on file  Other Topics Concern  . Not on file  Social History Narrative   ** Merged History Encounter **       Family History  Problem Relation Age of Onset  . Cancer Mother        breast   . Stroke Mother   . Diabetes Mother   . Hypertension Mother   . Heart attack Father   . Diabetes Sister   . Lung cancer Brother   . Heart disease Brother     Objective: Office vital signs reviewed. There were no vitals taken for this visit.  Physical Examination:  General: Awake, alert, *** nourished, No acute distress HEENT: Normal    Neck: No masses palpated. No lymphadenopathy    Ears: Tympanic membranes intact, normal light reflex, no erythema, no bulging    Eyes: PERRLA, extraocular membranes intact, sclera ***    Nose: nasal turbinates moist, *** nasal discharge    Throat: moist mucus membranes, no erythema, *** tonsillar exudate.  Airway is patent Cardio: regular rate and rhythm, S1S2 heard, no murmurs appreciated Pulm: clear to auscultation bilaterally, no wheezes, rhonchi or rales; normal work of breathing on room air GI: soft, non-tender, non-distended, bowel sounds present x4, no hepatomegaly, no splenomegaly, no masses GU: external vaginal tissue ***, cervix ***, *** punctate lesions on cervix appreciated, *** discharge from cervical os, *** bleeding, *** cervical motion tenderness, *** abdominal/ adnexal masses Extremities: warm, well perfused, No edema, cyanosis or clubbing; +*** pulses bilaterally MSK: *** gait and *** station Skin: dry; intact; no rashes or lesions Neuro: *** Strength and light touch sensation grossly intact, *** DTRs ***/4  Assessment/ Plan: 67 y.o. female   ***  No orders of the defined types were placed in this encounter.  No orders of the defined types were placed in this encounter.    Janora Norlander, DO [place] [phone]

## 2018-05-15 NOTE — Telephone Encounter (Signed)
Spoke to pt daughter Lonn [place] and have pt scheduled to come in 7/2 at 315 to see Dr Lajuana Ripple for Hospital f/u, pt was seen last Thursday at [facility] and was admitted until Saturday for possible stroke, daughter stated that echo was negative, but pt is still having issues with mobility and slurred speech.

## 2018-05-16 ENCOUNTER — Encounter: Payer: Self-pay | Admitting: Family Medicine

## 2018-05-16 ENCOUNTER — Ambulatory Visit: Payer: Medicare Other | Admitting: Family Medicine

## 2018-05-16 ENCOUNTER — Ambulatory Visit (INDEPENDENT_AMBULATORY_CARE_PROVIDER_SITE_OTHER): Payer: Medicare Other | Admitting: Family Medicine

## 2018-05-16 VITALS — BP 107/59 | HR 56 | Temp 96.9°F | Ht 64.0 in | Wt 146.2 lb

## 2018-05-16 DIAGNOSIS — I6930 Unspecified sequelae of cerebral infarction: Secondary | ICD-10-CM | POA: Diagnosis not present

## 2018-05-16 DIAGNOSIS — I10 Essential (primary) hypertension: Secondary | ICD-10-CM | POA: Diagnosis not present

## 2018-05-16 DIAGNOSIS — R350 Frequency of micturition: Secondary | ICD-10-CM | POA: Diagnosis not present

## 2018-05-16 DIAGNOSIS — I6523 Occlusion and stenosis of bilateral carotid arteries: Secondary | ICD-10-CM

## 2018-05-16 NOTE — Patient Instructions (Signed)
Great to see you!   

## 2018-05-16 NOTE — Progress Notes (Signed)
   HPI  Patient presents today for follow-up after hospitalization.  Patient was hospitalized with slurred speech and difficulty walking, felt to be TIA.  Her daughter, and RN at the cancer center in the [place], is concerned that she may have had only a UTI.  Patient has had increased urinary frequency.  Hypertension She is now on Toprol plus elevated dose of lisinopril.  She seems to be tolerating well. She presented with dizziness which is improving.   PMH: Smoking status noted ROS: Per HPI  Objective: BP (!) 107/59   Pulse (!) 56   Temp (!) 96.9 F (36.1 C) (Oral)   Ht '5\' 4"'$  (1.626 m)   Wt 146 lb 3.2 oz (66.3 kg)   SpO2 97%   BMI 25.10 kg/m  Gen: NAD, alert, cooperative with exam HEENT: NCAT, EOMI, PERRL CV: RRR, good S1/S2, no murmur Resp: CTABL, no wheezes, non-labored Ext: No edema, warm Neuro: Alert and oriented  Assessment and plan:  #Hypertension Well-controlled today, patient's lisinopril was titrated from 5 mg to 20 mg, she appears to be tolerating it well Repeat BMP If blood pressure is below 100 or dizziness worsens patient's family will cut lisinopril back to 10 mg daily  #Late effect of CVA Patient with some dizziness that is improving, also with slurred speech that is improving.   #Urinary frequency Treated with 1 g of Rocephin in the hospital, then urine culture was negative UA and urine culture    Orders Placed This Encounter  Procedures  . Urine Culture  . BMP8+EGFR  . Urinalysis, Complete     Laroy Apple, MD [facility] 05/16/2018, 4:58 PM

## 2018-05-17 ENCOUNTER — Telehealth: Payer: Self-pay | Admitting: Family Medicine

## 2018-05-17 ENCOUNTER — Other Ambulatory Visit: Payer: Medicare Other

## 2018-05-17 LAB — URINALYSIS, COMPLETE
Bilirubin, UA: NEGATIVE
KETONES UA: NEGATIVE
NITRITE UA: NEGATIVE
Protein, UA: NEGATIVE
RBC, UA: NEGATIVE
Specific Gravity, UA: 1.015 (ref 1.005–1.030)
UUROB: 0.2 mg/dL (ref 0.2–1.0)
pH, UA: 5.5 (ref 5.0–7.5)

## 2018-05-17 LAB — BMP8+EGFR
BUN/Creatinine Ratio: 29 — ABNORMAL HIGH (ref 12–28)
BUN: 20 mg/dL (ref 8–27)
CALCIUM: 10.3 mg/dL (ref 8.7–10.3)
CHLORIDE: 108 mmol/L — AB (ref 96–106)
CO2: 21 mmol/L (ref 20–29)
CREATININE: 0.7 mg/dL (ref 0.57–1.00)
GFR calc Af Amer: 104 mL/min/{1.73_m2} (ref >59)
GFR calc non Af Amer: 91 mL/min/{1.73_m2} (ref >59)
Glucose: 169 mg/dL — ABNORMAL HIGH (ref 65–99)
Potassium: 5.2 mmol/L (ref 3.5–5.2)
Sodium: 142 mmol/L (ref 134–144)

## 2018-05-17 LAB — MICROSCOPIC EXAMINATION

## 2018-05-19 LAB — URINE CULTURE

## 2018-05-19 NOTE — Telephone Encounter (Signed)
Dr Darnell Level pt., hasnt been read yet?

## 2018-05-19 NOTE — Telephone Encounter (Signed)
The only lab results I see is from 05/16/18 and Dr. Wendi Snipes has addressed these.

## 2018-05-30 ENCOUNTER — Other Ambulatory Visit: Payer: Self-pay | Admitting: *Deleted

## 2018-05-30 MED ORDER — VITAMIN D 50 MCG (2000 UT) PO CAPS: 1.0000 | ORAL_CAPSULE | Freq: Every day | ORAL | 1 refills | Status: DC

## 2018-05-30 MED ORDER — PRAVASTATIN SODIUM 40 MG PO TABS: 40.0000 mg | ORAL_TABLET | Freq: Every day | ORAL | 12 refills | Status: DC

## 2018-06-10 ENCOUNTER — Other Ambulatory Visit: Payer: Self-pay | Admitting: Cardiology

## 2018-06-12 ENCOUNTER — Ambulatory Visit (INDEPENDENT_AMBULATORY_CARE_PROVIDER_SITE_OTHER): Payer: Medicare Other

## 2018-06-12 DIAGNOSIS — I2510 Atherosclerotic heart disease of native coronary artery without angina pectoris: Secondary | ICD-10-CM

## 2018-06-12 DIAGNOSIS — E7800 Pure hypercholesterolemia, unspecified: Secondary | ICD-10-CM

## 2018-06-12 DIAGNOSIS — I69828 Other speech and language deficits following other cerebrovascular disease: Secondary | ICD-10-CM

## 2018-06-12 DIAGNOSIS — E119 Type 2 diabetes mellitus without complications: Secondary | ICD-10-CM | POA: Diagnosis not present

## 2018-06-12 DIAGNOSIS — Z794 Long term (current) use of insulin: Secondary | ICD-10-CM

## 2018-06-12 DIAGNOSIS — R2689 Other abnormalities of gait and mobility: Secondary | ICD-10-CM | POA: Diagnosis not present

## 2018-06-12 DIAGNOSIS — I10 Essential (primary) hypertension: Secondary | ICD-10-CM

## 2018-06-12 DIAGNOSIS — I69898 Other sequelae of other cerebrovascular disease: Secondary | ICD-10-CM

## 2018-06-12 DIAGNOSIS — I499 Cardiac arrhythmia, unspecified: Secondary | ICD-10-CM

## 2018-06-12 DIAGNOSIS — Z7902 Long term (current) use of antithrombotics/antiplatelets: Secondary | ICD-10-CM

## 2018-06-12 DIAGNOSIS — M8580 Other specified disorders of bone density and structure, unspecified site: Secondary | ICD-10-CM

## 2018-06-13 ENCOUNTER — Other Ambulatory Visit: Payer: Self-pay | Admitting: Family Medicine

## 2018-06-26 ENCOUNTER — Other Ambulatory Visit: Payer: Self-pay

## 2018-06-26 MED ORDER — GABAPENTIN 400 MG PO CAPS: ORAL_CAPSULE | ORAL | 0 refills | Status: DC

## 2018-06-27 ENCOUNTER — Ambulatory Visit: Payer: Medicare Other | Admitting: Family Medicine

## 2018-06-29 ENCOUNTER — Encounter: Payer: Self-pay | Admitting: Family Medicine

## 2018-07-07 ENCOUNTER — Other Ambulatory Visit: Payer: Self-pay | Admitting: *Deleted

## 2018-07-07 MED ORDER — INSULIN DEGLUDEC 100 UNIT/ML ~~LOC~~ SOPN: 16.0000 [IU] | PEN_INJECTOR | SUBCUTANEOUS | 0 refills | Status: AC

## 2018-07-30 ENCOUNTER — Other Ambulatory Visit: Payer: Self-pay | Admitting: Family Medicine

## 2018-07-31 NOTE — Telephone Encounter (Signed)
Last A1C 03/23/18

## 2018-08-01 NOTE — Telephone Encounter (Signed)
Please have her schedule follow up for DM.  It appears she had an elevated PTH earlier this year as well that we should discuss.

## 2018-08-01 NOTE — Telephone Encounter (Signed)
Attempted to contact patient - nvm

## 2018-08-01 NOTE — Telephone Encounter (Signed)
Passing to you

## 2018-08-11 ENCOUNTER — Other Ambulatory Visit: Payer: Self-pay | Admitting: *Deleted

## 2018-08-11 MED ORDER — CLOPIDOGREL BISULFATE 75 MG PO TABS: 75.0000 mg | ORAL_TABLET | Freq: Every day | ORAL | 0 refills | Status: DC

## 2018-08-21 ENCOUNTER — Ambulatory Visit (INDEPENDENT_AMBULATORY_CARE_PROVIDER_SITE_OTHER): Payer: Medicare Other | Admitting: Family Medicine

## 2018-08-21 ENCOUNTER — Encounter: Payer: Self-pay | Admitting: Family Medicine

## 2018-08-21 VITALS — BP 101/62 | HR 63 | Temp 97.9°F | Ht 64.0 in | Wt 143.0 lb

## 2018-08-21 DIAGNOSIS — E1159 Type 2 diabetes mellitus with other circulatory complications: Secondary | ICD-10-CM | POA: Diagnosis not present

## 2018-08-21 DIAGNOSIS — M21372 Foot drop, left foot: Secondary | ICD-10-CM

## 2018-08-21 DIAGNOSIS — Z8673 Personal history of transient ischemic attack (TIA), and cerebral infarction without residual deficits: Secondary | ICD-10-CM

## 2018-08-21 DIAGNOSIS — I10 Essential (primary) hypertension: Secondary | ICD-10-CM

## 2018-08-21 MED ORDER — DULOXETINE HCL 30 MG PO CPEP: ORAL_CAPSULE | ORAL | 1 refills | Status: DC

## 2018-08-21 MED ORDER — CLOPIDOGREL BISULFATE 75 MG PO TABS: 75.0000 mg | ORAL_TABLET | Freq: Every day | ORAL | 2 refills | Status: DC

## 2018-08-21 MED ORDER — METFORMIN HCL 1000 MG PO TABS: 1000.0000 mg | ORAL_TABLET | Freq: Two times a day (BID) | ORAL | 1 refills | Status: DC

## 2018-08-21 MED ORDER — GABAPENTIN 400 MG PO CAPS: ORAL_CAPSULE | ORAL | 1 refills | Status: DC

## 2018-08-21 NOTE — Progress Notes (Signed)
Subjective: CC: DM2 PCP: Janora Norlander, DO [id number] [name] is a 67 y.o. female presenting to clinic today for:  1. DM2 w/ HTN Patient reports long-standing history of type 2 diabetes.  She is currently on metformin 1000 mg p.o. twice daily, sliding scale NovoLog and [place].  She is also on lisinopril and metoprolol.  She reports fasting blood sugars typically run between 100-150.  Afternoon blood sugars are around 200s.  Denies any hypoglycemic episodes.  She reports seeing my eye doctor for eye exams.  She thinks her last eye exam was earlier this year.  Denies any polydipsia, polyuria or visual disturbance.  2.  History of stroke/foot drop Patient reports a history of CVA in the past.  It affected her left lower extremity and she reports that she has associated left foot drop as a result.  She often drags the left foot and is interested in getting a left ankle brace to help with this.  She reports associated decreased sensation in the left lower extremity.  Denies any other neurologic deficits as a result of the stroke.  No dysphasia, visual disturbance, difficulty with words or difficulty with left upper extremity motor function.  3. Hyperparathyroidism Patient currently under the care of endocrinology for hyperparathyroidism.  Next planned visit is on the 11th.  For now, they are holding off on any surgical interventions given multiple comorbidities and frailty.  ROS: Per HPI  No Known Allergies Past Medical History:  Diagnosis Date  . Anxiety    no rx  . Arthritis   . Cancer [facility])    Left mastectomy, 2/11  . Carotid disease, bilateral ([place])    Followed by Dr. Leonie Man  . Carotid disease, bilateral ([place]) 10/21/2011  . Coronary artery disease    cabg 2007  . Diabetes mellitus   . HLD (hyperlipidemia)   . Hypertension   . Neuromuscular disorder ([place])   . Neuropathic pain, leg   . Stroke [facility])    more than 10 yrs weakness rt leg  . Stroke, lacunar ([place])    Followed  by Dr. Leonie Man    Current Outpatient Medications:  .  aspirin 81 MG chewable tablet, Chew 81 mg by mouth daily.  , Disp: , Rfl:  .  Cholecalciferol (VITAMIN D) 2000 units CAPS, Take 1 capsule (2,000 Units total) by mouth daily., Disp: 90 capsule, Rfl: 1 .  clopidogrel (PLAVIX) 75 MG tablet, Take 1 tablet (75 mg total) by mouth daily., Disp: 90 tablet, Rfl: 0 .  Continuous Blood Gluc Receiver (FREESTYLE LIBRE 14 DAY READER) DEVI, 1 Device by Does not apply route daily as needed., Disp: 1 Device, Rfl: 0 .  DULoxetine (CYMBALTA) 30 MG capsule, TAKE 1 CAPSULE BY MOUTH EVERY DAY, Disp: 90 capsule, Rfl: 0 .  folic acid (FOLVITE) 1 MG tablet, Take 1 tablet (1 mg total) by mouth daily., Disp: 90 tablet, Rfl: 3 .  gabapentin (NEURONTIN) 400 MG capsule, TAKE 1 CAPSULE BY MOUTH THREE TIMES A DAY, Disp: 270 capsule, Rfl: 0 .  glucose blood (ACCU-CHEK AVIVA PLUS) test strip, Use to check blood sugars TID, Disp: 300 each, Rfl: 3 .  insulin aspart (NOVOLOG) 100 UNIT/ML injection, Inject 4 Units into the skin. Before lunch and dinner, Disp: , Rfl:  .  lisinopril (PRINIVIL,ZESTRIL) 20 MG tablet, Take 20 mg by mouth daily., Disp: , Rfl:  .  metFORMIN (GLUCOPHAGE) 1000 MG tablet, TAKE 1 TABLET BY MOUTH TWICE A DAY, Disp: 180 tablet, Rfl: 0 .  metoprolol  tartrate (LOPRESSOR) 25 MG tablet, Take 0.5 tablets (12.5 mg total) by mouth 2 (two) times daily., Disp: 90 tablet, Rfl: 1 .  naproxen sodium (ALEVE) 220 MG tablet, Take 220 mg by mouth as needed., Disp: , Rfl:  .  oxyCODONE-acetaminophen (PERCOCET) 10-325 MG tablet, Take 1 tablet by mouth 4 (four) times daily as needed for pain. , Disp: , Rfl:  .  pravastatin (PRAVACHOL) 40 MG tablet, Take 1 tablet (40 mg total) by mouth daily., Disp: 30 tablet, Rfl: 12 Social History   Socioeconomic History  . Marital status: Widowed    Spouse name: Not on file  . Number of children: Not on file  . Years of education: Not on file  . Highest education level: Not on file    Occupational History  . Not on file  Social Needs  . Financial resource strain: Not on file  . Food insecurity:    Worry: Not on file    Inability: Not on file  . Transportation needs:    Medical: Not on file    Non-medical: Not on file  Tobacco Use  . Smoking status: Never Smoker  . Smokeless tobacco: Never Used  Substance and Sexual Activity  . Alcohol use: No    Alcohol/week: 0.0 standard drinks  . Drug use: No  . Sexual activity: Never    Birth control/protection: Post-menopausal  Lifestyle  . Physical activity:    Days per week: Not on file    Minutes per session: Not on file  . Stress: Not on file  Relationships  . Social connections:    Talks on phone: Not on file    Gets together: Not on file    Attends religious service: Not on file    Active member of club or organization: Not on file    Attends meetings of clubs or organizations: Not on file    Relationship status: Not on file  . Intimate partner violence:    Fear of current or ex partner: Not on file    Emotionally abused: Not on file    Physically abused: Not on file    Forced sexual activity: Not on file  Other Topics Concern  . Not on file  Social History Narrative   ** Merged History Encounter **       Family History  Problem Relation Age of Onset  . Cancer Mother        breast   . Stroke Mother   . Diabetes Mother   . Hypertension Mother   . Heart attack Father   . Diabetes Sister   . Lung cancer Brother   . Heart disease Brother     Objective: Office vital signs reviewed. BP 101/62   Pulse 63   Temp 97.9 F (36.6 C) (Oral)   Ht 5\' 4"  (1.626 m)   Wt 143 lb (64.9 kg)   BMI 24.55 kg/m   Physical Examination:  General: Awake, alert, chronically ill appearing female. No acute distress HEENT: Normal, sclera white, MMM Cardio: regular rate and rhythm, N8G9 heard, systolic murmur appreciated Pulm: clear to auscultation bilaterally, no wheezes, rhonchi or rales; normal work of  breathing on room air Extremities: warm, well perfused, No edema, cyanosis or clubbing; +2 pulses bilaterally MSK: requires cane for ambulation Skin: dry; intact; no rashes or lesions Neuro: left foot drop noted. see DM foot exam below  Diabetic Foot Form - Detailed   Diabetic Foot Exam - detailed Diabetic Foot exam was performed with the following  findings:  Yes 08/21/2018  5:18 PM  Visual Foot Exam completed.:  Yes  Can the patient see the bottom of their feet?:  No Are the shoes appropriate in style and fit?:  Yes Is there swelling or and abnormal foot shape?:  No Is there a claw toe deformity?:  No Is there elevated skin temparature?:  No Is there foot or ankle muscle weakness?:  Yes Normal Range of Motion:  No Pulse Foot Exam completed.:  Yes  Right posterior Tibialias:  Diminished Left posterior Tibialias:  Diminished  Right Dorsalis Pedis:  Diminished Left Dorsalis Pedis:  Diminished  Semmes-Weinstein Monofilament Test R Site 1-Great Toe:  Pos L Site 1-Great Toe:  Pos       Assessment/ Plan: 67 y.o. female   1. DM type 2 causing vascular disease ([place]) Diabetic foot exam was performed today.  Physical exam was notable for foot drop in the left.  I have prescribed her an AFO and instructed her on where to get this.  A1c was ordered but it appears that patient left prior to obtaining lab.  I will reach out to her endocrinologist, who will be seeing her soon to see if they might obtain this for me.  We will titrate medications pending this result.  She is to follow-up with me in 3 months or sooner if needed. - Bayer DCA Hb A1c Waived  2. Essential hypertension, benign Blood pressure under good control.  No changes to medication regimen needed.  3. Left foot drop AFO prescribed.  4. History of stroke   Orders Placed This Encounter  Procedures  . Bayer DCA Hb A1c Waived   Meds ordered this encounter  Medications  . clopidogrel (PLAVIX) 75 MG tablet    Sig: Take 1  tablet (75 mg total) by mouth daily.    Dispense:  90 tablet    Refill:  2  . DULoxetine (CYMBALTA) 30 MG capsule    Sig: TAKE 1 CAPSULE BY MOUTH EVERY DAY    Dispense:  90 capsule    Refill:  1  . gabapentin (NEURONTIN) 400 MG capsule    Sig: TAKE 1 CAPSULE BY MOUTH THREE TIMES A DAY    Dispense:  270 capsule    Refill:  1  . metFORMIN (GLUCOPHAGE) 1000 MG tablet    Sig: Take 1 tablet (1,000 mg total) by mouth 2 (two) times daily.    Dispense:  180 tablet    Refill:  [place], [place] [phone]

## 2018-08-21 NOTE — Patient Instructions (Signed)
You had labs performed today.  You will be contacted with the results of the labs once they are available, usually in the next 3 business days for routine lab work.   

## 2018-08-23 LAB — HM DIABETES EYE EXAM

## 2018-09-26 ENCOUNTER — Other Ambulatory Visit: Payer: Self-pay | Admitting: Cardiology

## 2018-10-13 ENCOUNTER — Other Ambulatory Visit: Payer: Self-pay | Admitting: Cardiology

## 2018-10-26 ENCOUNTER — Telehealth: Payer: Self-pay | Admitting: Family Medicine

## 2018-10-26 ENCOUNTER — Encounter: Payer: Self-pay | Admitting: Cardiology

## 2018-10-26 ENCOUNTER — Ambulatory Visit (INDEPENDENT_AMBULATORY_CARE_PROVIDER_SITE_OTHER): Payer: Medicare Other | Admitting: Cardiology

## 2018-10-26 VITALS — BP 139/69 | HR 51 | Ht 64.0 in | Wt 147.2 lb

## 2018-10-26 DIAGNOSIS — I6523 Occlusion and stenosis of bilateral carotid arteries: Secondary | ICD-10-CM

## 2018-10-26 DIAGNOSIS — E782 Mixed hyperlipidemia: Secondary | ICD-10-CM | POA: Diagnosis not present

## 2018-10-26 DIAGNOSIS — I2510 Atherosclerotic heart disease of native coronary artery without angina pectoris: Secondary | ICD-10-CM | POA: Diagnosis not present

## 2018-10-26 DIAGNOSIS — I493 Ventricular premature depolarization: Secondary | ICD-10-CM

## 2018-10-26 DIAGNOSIS — I10 Essential (primary) hypertension: Secondary | ICD-10-CM | POA: Diagnosis not present

## 2018-10-26 MED ORDER — BLOOD GLUCOSE MONITOR KIT: PACK | 0 refills | Status: DC

## 2018-10-26 NOTE — Telephone Encounter (Signed)
[name] has called stating the pharmacist has told her that they are no longer going to be making the glucose blood (ACCU-CHEK AVIVA PLUS) test strip they are being discontinued can we please send something else to pharmacy    [place]

## 2018-10-26 NOTE — Telephone Encounter (Signed)
New rx for glucometer sent over

## 2018-10-26 NOTE — Progress Notes (Signed)
Clinical Summary [name] is a 67 y.o.female seen today for follow up of the following medical problems.   1. CAD - hx of CABG in 2007, normal LVEF 60% - 02/2016 she had nuclear stress without clear ischemia.  - 02/2018 nuclear stress no ischemia  - 2 episodes chest pain. Sharp pain left sided. Awoke from sleep. No other symptoms. 6/10 in severity. Went back to sleep. Unsure duration.  - no recent SOB or DOE - no exertional symptoms. Walks up incline to stable regularly without troubles    - pcp incrased lisinopril to 20mg  daily. Ran out of lisinopril few weeks ago   3. HL - she stopped lipitor due to dizziness.  - started pravastatin 20mg  daily, tolerating well 03/2018 TC 127 TG 42 HDL 63 LDL 56  4. Hx of CVA - she is on both ASA and plavix, she is unsure exactly why. Assume its related to her hx of CVA as no clear cardiac indication to be on plavix from records   5. Syncope - seen in ER 05/21/17 - initially low bp's by EMS, resolved with IVFs -elevated Cr and BUN from baseline supporting dehydration. - significant abdominal painat the time, CT showed severe constipation  -no recent episodes  6. PVCs - noted atrecentpcp visit. - denies any palpitations.  - K 5.1 - 1-2 cups of coffee.No other caffeine  12/2017 holter with 44% ventricular burden, short runs of NSVT up to 5 beats - 01/2018 echo LVEF 50-55%, no WMAs - 02/2018 nuclear stress: no clear ischemia. Possible mid to apical anterior and basal inferoseptal scar.   - no recent palpitations.     SH: husbandand a brotherpassed recently   Past Medical History:  Diagnosis Date  . Anxiety    no rx  . Arthritis   . Cancer [facility])    Left mastectomy, 2/11  . Carotid disease, bilateral ([place])    Followed by Dr. Leonie Man  . Carotid disease, bilateral ([place]) 10/21/2011  . Coronary artery disease    cabg 2007  . Diabetes mellitus   . HLD (hyperlipidemia)   . Hypertension   . Neuromuscular  disorder ([place])   . Neuropathic pain, leg   . Stroke [facility])    more than 10 yrs weakness rt leg  . Stroke, lacunar ([place])    Followed by Dr. Leonie Man     No Known Allergies   Current Outpatient Medications  Medication Sig Dispense Refill  . aspirin 81 MG chewable tablet Chew 81 mg by mouth daily.      . Cholecalciferol (VITAMIN D) 2000 units CAPS Take 1 capsule (2,000 Units total) by mouth daily. 90 capsule 1  . clopidogrel (PLAVIX) 75 MG tablet Take 1 tablet (75 mg total) by mouth daily. 90 tablet 2  . Continuous Blood Gluc Receiver (FREESTYLE LIBRE 14 DAY READER) DEVI 1 Device by Does not apply route daily as needed. 1 Device 0  . DULoxetine (CYMBALTA) 30 MG capsule TAKE 1 CAPSULE BY MOUTH EVERY DAY 90 capsule 1  . folic acid (FOLVITE) 1 MG tablet Take 1 tablet (1 mg total) by mouth daily. 90 tablet 3  . gabapentin (NEURONTIN) 400 MG capsule TAKE 1 CAPSULE BY MOUTH THREE TIMES A DAY 270 capsule 1  . glucose blood (ACCU-CHEK AVIVA PLUS) test strip Use to check blood sugars TID 300 each 3  . insulin aspart (NOVOLOG) 100 UNIT/ML injection Inject 4 Units into the skin. Before lunch and dinner    . lisinopril (PRINIVIL,ZESTRIL)  20 MG tablet Take 20 mg by mouth daily.    . metFORMIN (GLUCOPHAGE) 1000 MG tablet Take 1 tablet (1,000 mg total) by mouth 2 (two) times daily. 180 tablet 1  . metoprolol tartrate (LOPRESSOR) 25 MG tablet TAKE 1/2 TABLET (12.5 MG TOTAL) BY MOUTH 2 (TWO) TIMES DAILY. 90 tablet 1  . naproxen sodium (ALEVE) 220 MG tablet Take 220 mg by mouth as needed.    Marland Kitchen oxyCODONE-acetaminophen (PERCOCET) 10-325 MG tablet Take 1 tablet by mouth 4 (four) times daily as needed for pain.     . pravastatin (PRAVACHOL) 40 MG tablet Take 1 tablet (40 mg total) by mouth daily. 30 tablet 12   No current facility-administered medications for this visit.      Past Surgical History:  Procedure Laterality Date  . arm fx Left 11  . CATARACT EXTRACTION W/PHACO Right 09/26/2017   Procedure:  CATARACT EXTRACTION PHACO AND INTRAOCULAR LENS PLACEMENT (IOC);  Surgeon: Tonny Branch, MD;  Location: [facility];  Service: Ophthalmology;  Laterality: Right;  CDE: 8.52  . CATARACT EXTRACTION W/PHACO Left 10/31/2017   Procedure: CATARACT EXTRACTION PHACO AND INTRAOCULAR LENS PLACEMENT LEFT EYE CDE=7.75;  Surgeon: Tonny Branch, MD;  Location: [facility];  Service: Ophthalmology;  Laterality: Left;  left  . CORONARY ARTERY BYPASS GRAFT  07  . EYE SURGERY Right    cateracts  . FRACTURE SURGERY Bilateral    bilateral wrist and right knee orif's  . INSERTION OF MESH N/A 04/05/2016   Procedure: INSERTION OF MESH;  Surgeon: Rolm Bookbinder, MD;  Location: [place];  Service: General;  Laterality: N/A;  . MASTECTOMY  2010   lt-nodes  . ORIF ULNAR FRACTURE  10/01/2011   Procedure: OPEN REDUCTION INTERNAL FIXATION (ORIF) ULNAR FRACTURE;  Surgeon: Kerin Salen;  Location: [facility];  Service: Orthopedics;  Laterality: Right;  and orif scaphoid  . UMBILICAL HERNIA REPAIR  04/05/2016   with 6.4 cm ventrallex mesh  . UMBILICAL HERNIA REPAIR N/A 04/05/2016   Procedure: UMBILICAL HERNIA REPAIR;  Surgeon: Rolm Bookbinder, MD;  Location: [place];  Service: General;  Laterality: N/A;     No Known Allergies    Family History  Problem Relation Age of Onset  . Cancer Mother        breast   . Stroke Mother   . Diabetes Mother   . Hypertension Mother   . Heart attack Father   . Diabetes Sister   . Lung cancer Brother   . Heart disease Brother      Social History Ms. Digioia reports that she has never smoked. She has never used smokeless tobacco. Ms. Zuniga reports no history of alcohol use.   Review of Systems CONSTITUTIONAL: No weight loss, fever, chills, weakness or fatigue.  HEENT: Eyes: No visual loss, blurred vision, double vision or yellow sclerae.No hearing loss, sneezing, congestion, runny nose or sore throat.  SKIN: No rash or itching.  CARDIOVASCULAR: per hpi RESPIRATORY: No shortness of breath,  cough or sputum.  GASTROINTESTINAL: No anorexia, nausea, vomiting or diarrhea. No abdominal pain or blood.  GENITOURINARY: No burning on urination, no polyuria NEUROLOGICAL: No headache, dizziness, syncope, paralysis, ataxia, numbness or tingling in the extremities. No change in bowel or bladder control.  MUSCULOSKELETAL: No muscle, back pain, joint pain or stiffness.  LYMPHATICS: No enlarged nodes. No history of splenectomy.  PSYCHIATRIC: No history of depression or anxiety.  ENDOCRINOLOGIC: No reports of sweating, cold or heat intolerance. No polyuria or polydipsia.  Marland Kitchen   Physical  Examination Vitals:   10/26/18 1406  BP: 139/69  Pulse: (!) 51  SpO2: 98%   Vitals:   10/26/18 1406  Weight: 147 lb 3.2 oz (66.8 kg)  Height: 5\' 4"  (1.626 m)    Gen: resting comfortably, no acute distress HEENT: no scleral icterus, pupils equal round and reactive, no palptable cervical adenopathy,  CV: RRR, no m/r/g, no jvd Resp: Clear to auscultation bilaterally GI: abdomen is soft, non-tender, non-distended, normal bowel sounds, no hepatosplenomegaly MSK: extremities are warm, no edema.  Skin: warm, no rash Neuro:  no focal deficits Psych: appropriate affect   Diagnostic Studies 12/2014 echo Study Conclusions  - Left ventricle: The cavity size was normal. Systolic function was normal. The estimated ejection fraction was in the range of 50% to 55%. Very mild concentric hypertrophy. Posterior wall measurement is overestimated. Wall motion was normal; there were no regional wall motion abnormalities. Doppler parameters are consistent with abnormal left ventricular relaxation (grade 1 diastolic dysfunction). Doppler parameters are consistent with high ventricular filling pressure. - Mitral valve: There was mild regurgitation. - Left atrium: The atrium was mildly dilated. Volume/bsa, S: 30.5 ml/m^2. - Right ventricle: Systolic function was mildly reduced. - Tricuspid valve:  There was mild regurgitation.   05/2016 carotid US Elevated velocities RICA likely due to tortous vessel.    02/2016 Nuclear stress  There was no ST segment deviation noted during stress.  Findings consistent with prior small inferoseptal myocardial infarction with no current peri-infarct ischemia.  This is a low risk study. There is no myocardium currently at jeopardy.  The left ventricular ejection fraction is hyperdynamic (>65%).   12/2017 holter  Min HR 59, Max HR 122, Avg HR 81. Primary rhythm is sinus rhythm  No supraventricular ectopy  Very frequent ventricular ectopy (44%). In the the form of isolated PVCs, coupltes, bigeminy, trigeminy.Short runs of NSVT up to 5 beats  No diary submitted  No sustained arrhythmias    Assessment and Plan  1. CAD - some recent atypical symptoms. Stress test earlier this year without signficant ischemia - continue to monitor at this time, continue medical therapy.    2. HTN - mildly elevated but ran out of lisinopril, her pcp has recently refilled.    3. HL -did not tolerate high dose statin, now on pravastatin 20mg  daily -at goal, continue statin.   4.PVCs - high burden based on prior holter monitor. Recent echo LVEF 50-55%, recent nuclear stress without current ischemia. Possibly due to prior scar related to her history of CAD and prior CABG.  - dynamap heart rates falsesly low due to PVCs.  - continue beta blocker      Arnoldo Lenis, M.D.

## 2018-10-26 NOTE — Patient Instructions (Signed)

## 2018-11-21 ENCOUNTER — Ambulatory Visit: Payer: Medicare Other | Admitting: Family Medicine

## 2018-11-28 ENCOUNTER — Encounter: Payer: Self-pay | Admitting: Family Medicine

## 2018-11-28 ENCOUNTER — Ambulatory Visit (INDEPENDENT_AMBULATORY_CARE_PROVIDER_SITE_OTHER): Payer: Medicare Other | Admitting: Family Medicine

## 2018-11-28 VITALS — BP 122/67 | HR 56 | Temp 97.1°F | Ht 64.0 in | Wt 146.0 lb

## 2018-11-28 DIAGNOSIS — E1159 Type 2 diabetes mellitus with other circulatory complications: Secondary | ICD-10-CM | POA: Diagnosis not present

## 2018-11-28 DIAGNOSIS — I152 Hypertension secondary to endocrine disorders: Secondary | ICD-10-CM

## 2018-11-28 DIAGNOSIS — I10 Essential (primary) hypertension: Secondary | ICD-10-CM

## 2018-11-28 DIAGNOSIS — E785 Hyperlipidemia, unspecified: Secondary | ICD-10-CM | POA: Diagnosis not present

## 2018-11-28 DIAGNOSIS — E1169 Type 2 diabetes mellitus with other specified complication: Secondary | ICD-10-CM

## 2018-11-28 LAB — BASIC METABOLIC PANEL
BUN/Creatinine Ratio: 20 (ref 12–28)
BUN: 17 mg/dL (ref 8–27)
CALCIUM: 10.7 mg/dL — AB (ref 8.7–10.3)
CO2: 22 mmol/L (ref 20–29)
CREATININE: 0.87 mg/dL (ref 0.57–1.00)
Chloride: 107 mmol/L — ABNORMAL HIGH (ref 96–106)
GFR, EST AFRICAN AMERICAN: 80 mL/min/{1.73_m2} (ref >59)
GFR, EST NON AFRICAN AMERICAN: 70 mL/min/{1.73_m2} (ref >59)
Glucose: 132 mg/dL — ABNORMAL HIGH (ref 65–99)
POTASSIUM: 4.6 mmol/L (ref 3.5–5.2)
SODIUM: 141 mmol/L (ref 134–144)

## 2018-11-28 LAB — BAYER DCA HB A1C WAIVED: HB A1C (BAYER DCA - WAIVED): 7.4 % — ABNORMAL HIGH (ref <7.0)

## 2018-11-28 MED ORDER — BLOOD GLUCOSE MONITOR KIT: PACK | 0 refills | Status: AC

## 2018-11-28 MED ORDER — LANCETS MISC: 4 refills | Status: AC

## 2018-11-28 MED ORDER — INSULIN ASPART 100 UNIT/ML ~~LOC~~ SOLN: SUBCUTANEOUS | 12 refills | Status: DC

## 2018-11-28 MED ORDER — LISINOPRIL 20 MG PO TABS: 20.0000 mg | ORAL_TABLET | Freq: Every day | ORAL | 3 refills | Status: DC

## 2018-11-28 MED ORDER — PRAVASTATIN SODIUM 40 MG PO TABS: 40.0000 mg | ORAL_TABLET | Freq: Every day | ORAL | 3 refills | Status: DC

## 2018-11-28 MED ORDER — GLUCOSE BLOOD VI STRP: ORAL_STRIP | 4 refills | Status: AC

## 2018-11-28 NOTE — Progress Notes (Signed)
Subjective: CC: DM2/ [facility] PCP: Janora Norlander, DO [name] is a 68 y.o. female presenting to clinic today for:  1. DM2 w/ HTN, HLD Patient reports compliance with metformin, Tresiba 16 units and NovoLog 4 units.  She does report that the NovoLog is only used if her blood sugar is above a certain level and she does not use it with each meal as directed.  She is also on lisinopril and metoprolol and states that she needs refills on the lisinopril.  She reports fasting blood sugars typically run between 70-180 as of late.  She notes this morning she woke up with a blood sugar of 183 and now has an associated headache.  She had her eye exam done at my eye doctor since last visit and was negative for retinopathy.  She denies any chest pain, shortness of breath, lower extremity edema, visual disturbance or unplanned weight loss.  She also notes that she needs a new meter as the Accu-Chek strips for her current meter are being discontinued.  ROS: Per HPI  No Known Allergies Past Medical History:  Diagnosis Date  . Anxiety    no rx  . Arthritis   . Cancer [facility])    Left mastectomy, 2/11  . Carotid disease, bilateral ([place])    Followed by Dr. Leonie Man  . Carotid disease, bilateral ([place]) 10/21/2011  . Coronary artery disease    cabg 2007  . Diabetes mellitus   . HLD (hyperlipidemia)   . Hypertension   . Neuromuscular disorder ([place])   . Neuropathic pain, leg   . Stroke [facility])    more than 10 yrs weakness rt leg  . Stroke, lacunar ([place])    Followed by Dr. Leonie Man    Current Outpatient Medications:  .  aspirin 81 MG chewable tablet, Chew 81 mg by mouth daily.  , Disp: , Rfl:  .  blood glucose meter kit and supplies KIT, Dispense based on patient and insurance preference. Use up to four times daily as directed. (FOR ICD-9 250.00, 250.01)., Disp: 1 each, Rfl: 0 .  Cholecalciferol (VITAMIN D) 2000 units CAPS, Take 1 capsule (2,000 Units total) by mouth daily., Disp: 90 capsule, Rfl:  1 .  clopidogrel (PLAVIX) 75 MG tablet, Take 1 tablet (75 mg total) by mouth daily., Disp: 90 tablet, Rfl: 2 .  DULoxetine (CYMBALTA) 30 MG capsule, TAKE 1 CAPSULE BY MOUTH EVERY DAY, Disp: 90 capsule, Rfl: 1 .  folic acid (FOLVITE) 1 MG tablet, Take 1 tablet (1 mg total) by mouth daily., Disp: 90 tablet, Rfl: 3 .  gabapentin (NEURONTIN) 400 MG capsule, TAKE 1 CAPSULE BY MOUTH THREE TIMES A DAY, Disp: 270 capsule, Rfl: 1 .  glucose blood (ACCU-CHEK AVIVA PLUS) test strip, Use to check blood sugars TID, Disp: 300 each, Rfl: 3 .  insulin aspart (NOVOLOG) 100 UNIT/ML injection, Inject 4 Units into the skin. Before lunch and dinner, Disp: , Rfl:  .  insulin degludec (TRESIBA FLEXTOUCH) 100 UNIT/ML SOPN FlexTouch Pen, Inject 16 Units into the skin every morning., Disp: , Rfl:  .  lisinopril (PRINIVIL,ZESTRIL) 20 MG tablet, Take 20 mg by mouth daily., Disp: , Rfl:  .  metFORMIN (GLUCOPHAGE) 1000 MG tablet, Take 1 tablet (1,000 mg total) by mouth 2 (two) times daily., Disp: 180 tablet, Rfl: 1 .  metoprolol tartrate (LOPRESSOR) 25 MG tablet, TAKE 1/2 TABLET (12.5 MG TOTAL) BY MOUTH 2 (TWO) TIMES DAILY., Disp: 90 tablet, Rfl: 1 .  naproxen sodium (ALEVE) 220 MG  tablet, Take 220 mg by mouth as needed., Disp: , Rfl:  .  oxyCODONE-acetaminophen (PERCOCET) 10-325 MG tablet, Take 1 tablet by mouth 4 (four) times daily as needed for pain. , Disp: , Rfl:  .  pravastatin (PRAVACHOL) 40 MG tablet, Take 1 tablet (40 mg total) by mouth daily., Disp: 30 tablet, Rfl: 12 Social History   Socioeconomic History  . Marital status: Widowed    Spouse name: Not on file  . Number of children: Not on file  . Years of education: Not on file  . Highest education level: Not on file  Occupational History  . Not on file  Social Needs  . Financial resource strain: Not on file  . Food insecurity:    Worry: Not on file    Inability: Not on file  . Transportation needs:    Medical: Not on file    Non-medical: Not on file    Tobacco Use  . Smoking status: Never Smoker  . Smokeless tobacco: Never Used  Substance and Sexual Activity  . Alcohol use: No    Alcohol/week: 0.0 standard drinks  . Drug use: No  . Sexual activity: Never    Birth control/protection: Post-menopausal  Lifestyle  . Physical activity:    Days per week: Not on file    Minutes per session: Not on file  . Stress: Not on file  Relationships  . Social connections:    Talks on phone: Not on file    Gets together: Not on file    Attends religious service: Not on file    Active member of club or organization: Not on file    Attends meetings of clubs or organizations: Not on file    Relationship status: Not on file  . Intimate partner violence:    Fear of current or ex partner: Not on file    Emotionally abused: Not on file    Physically abused: Not on file    Forced sexual activity: Not on file  Other Topics Concern  . Not on file  Social History Narrative   ** Merged History Encounter **       Family History  Problem Relation Age of Onset  . Cancer Mother        breast   . Stroke Mother   . Diabetes Mother   . Hypertension Mother   . Heart attack Father   . Diabetes Sister   . Lung cancer Brother   . Heart disease Brother     Objective: Office vital signs reviewed. BP 122/67   Pulse (!) 56   Temp (!) 97.1 F (36.2 C) (Oral)   Ht 5' 4"  (1.626 m)   Wt 146 lb (66.2 kg)   BMI 25.06 kg/m   Physical Examination:  General: Awake, alert, chronically ill appearing female. No acute distress HEENT: Normal, sclera white, MMM Cardio: regular rate and rhythm, H1T0 heard, systolic murmur appreciated Pulm: clear to auscultation bilaterally, no wheezes, rhonchi or rales; normal work of breathing on room air MSK: usingcane for ambulation  Lab Results  Component Value Date   HGBA1C 7.6 (H) 03/23/2018   No results found for this or any previous visit (from the past 24 hour(s)).  Assessment/ Plan: 68 y.o. female   1. DM  type 2 causing vascular disease [facility]) Given age and multiple comorbidities, she is somewhat controlled though I think that she would benefit from tighter control.  Today her A1c was 7.4.  This is slightly down from previous check.  Given recent holidays okay to continue current regimen.  I reiterated that the NovoLog is prescribed 4 units with each lunch and supper.  Plan for pneumococcal vaccination at next visit, as patient is not feeling well today. - Bayer DCA Hb A1c Waived  2. Hypertension associated with diabetes ([place]) Under good control.  Continue current regimen.  Lisinopril refilled. - Basic Metabolic Panel  3. Hyperlipidemia associated with type 2 diabetes mellitus (HCC) Given history of cardiovascular disease and CVA, chronic need for statin.  Pravastatin refilled.   Orders Placed This Encounter  Procedures  . Bayer DCA Hb A1c Waived  . Basic Metabolic Panel   Meds ordered this encounter  Medications  . blood glucose meter kit and supplies KIT    Sig: Dispense based on patient and insurance preference. Use up to four times daily as directed. (DxE11.9/E11.59)    Dispense:  1 each    Refill:  0    Order Specific Question:   Number of strips    Answer:   100    Order Specific Question:   Number of lancets    Answer:   100  . glucose blood test strip    Sig: Test blood sugar up to 4 times daily. Dx E11.9/ E11.59.  Meter/ strips per insurance/patient preference.    Dispense:  300 each    Refill:  4  . Lancets MISC    Sig: Use to test blood sugar up to 4 times daily (E11.9/E11.59)    Dispense:  300 each    Refill:  4  . pravastatin (PRAVACHOL) 40 MG tablet    Sig: Take 1 tablet (40 mg total) by mouth daily.    Dispense:  90 tablet    Refill:  3  . insulin aspart (NOVOLOG) 100 UNIT/ML injection    Sig: Inject 4 units sub-q before lunch and dinner    Dispense:  10 mL    Refill:  12  . lisinopril (PRINIVIL,ZESTRIL) 20 MG tablet    Sig: Take 1 tablet (20 mg total) by  mouth daily.    Dispense:  90 tablet    Refill:  [place], [place] [phone]

## 2018-11-28 NOTE — Patient Instructions (Addendum)
You still have refills on the Metformin, Plavix.  I sent in the other medications and meter.  See me back in 3 months for diabetes check.

## 2018-12-01 ENCOUNTER — Other Ambulatory Visit: Payer: Self-pay | Admitting: Family Medicine

## 2018-12-01 ENCOUNTER — Encounter: Payer: Self-pay | Admitting: *Deleted

## 2018-12-11 ENCOUNTER — Telehealth: Payer: Self-pay | Admitting: Family Medicine

## 2018-12-11 MED ORDER — INSULIN ASPART 100 UNIT/ML FLEXPEN: 4.0000 [IU] | PEN_INJECTOR | Freq: Two times a day (BID) | SUBCUTANEOUS | 11 refills | Status: DC

## 2018-12-11 NOTE — Telephone Encounter (Signed)
Pharmacy [facility]   Pt daughter has called states that we sent in the novalog vials it needs to be novalog pens sent in otherwise the pt has hard time seeing how much she is drawing up which causes her sugar to go out of wack.

## 2018-12-11 NOTE — Telephone Encounter (Signed)
Pt notified RX changed to pens instead of vials Okayed per Dr Lajuana Ripple

## 2019-01-30 ENCOUNTER — Other Ambulatory Visit: Payer: Self-pay | Admitting: *Deleted

## 2019-01-30 MED ORDER — FOLIC ACID 1 MG PO TABS: 1.0000 mg | ORAL_TABLET | Freq: Every day | ORAL | 0 refills | Status: DC

## 2019-02-20 ENCOUNTER — Other Ambulatory Visit: Payer: Self-pay | Admitting: Cardiology

## 2019-02-20 MED ORDER — METOPROLOL TARTRATE 25 MG PO TABS: ORAL_TABLET | ORAL | 1 refills | Status: DC

## 2019-02-20 NOTE — Telephone Encounter (Signed)
°*  STAT* If patient is at the pharmacy, call can be transferred to refill team.   1. Which medications need to be refilled? metoprolol tartrate (LOPRESSOR) 25 MG tablet    2. Which pharmacy/location (including street and city if local pharmacy) is medication to be sent to? [facility] [place]  3. Do they need a 30 day or 90 day supply? [place]

## 2019-02-27 ENCOUNTER — Ambulatory Visit: Payer: Medicare Other | Admitting: Family Medicine

## 2019-02-27 ENCOUNTER — Other Ambulatory Visit: Payer: Self-pay

## 2019-03-05 ENCOUNTER — Other Ambulatory Visit: Payer: Self-pay

## 2019-03-05 ENCOUNTER — Ambulatory Visit (INDEPENDENT_AMBULATORY_CARE_PROVIDER_SITE_OTHER): Payer: Medicare Other | Admitting: Family Medicine

## 2019-03-05 DIAGNOSIS — E1169 Type 2 diabetes mellitus with other specified complication: Secondary | ICD-10-CM

## 2019-03-05 DIAGNOSIS — E1159 Type 2 diabetes mellitus with other circulatory complications: Secondary | ICD-10-CM

## 2019-03-05 DIAGNOSIS — E785 Hyperlipidemia, unspecified: Secondary | ICD-10-CM | POA: Diagnosis not present

## 2019-03-05 DIAGNOSIS — I10 Essential (primary) hypertension: Secondary | ICD-10-CM | POA: Diagnosis not present

## 2019-03-05 MED ORDER — METFORMIN HCL 1000 MG PO TABS: 1000.0000 mg | ORAL_TABLET | Freq: Two times a day (BID) | ORAL | 1 refills | Status: DC

## 2019-03-05 NOTE — Progress Notes (Signed)
Telephone visit  Subjective: CC: f/u DM2, HTN, HLD PCP: Janora Norlander, DO [id number] [name] is a 68 y.o. female calls for telephone consult today. Patient provides verbal consent for consult held via phone.  Location of patient: home Location of provider: [facility] Others present for call: family  1. Type 2 Diabetes with hypertension and hyperlipidemia:  Patient reports High at home: 300; Low at home: 87, Taking medication(s): Metformin 1000 mg p.o. twice daily, NovoLog 4 units q. meal, Tresiba 16 units every morning.  She also takes lisinopril, metformin and Pravachol she reports rare low and rare high blood sugars.  Last eye exam: Up-to-date Last foot exam: Up-to-date Last A1c:  Lab Results  Component Value Date   HGBA1C 7.4 (H) 11/28/2018   Nephropathy screen indicated?:  On ACE inhibitor Last flu, zoster and/or pneumovax:  Immunization History  Administered Date(s) Administered  . Influenza,inj,Quad PF,6+ Mos 08/04/2018    ROS: Denies any lightheadedness, swelling, chest pain or shortness of breath.   ROS: Per HPI  No Known Allergies Past Medical History:  Diagnosis Date  . Anxiety    no rx  . Arthritis   . Cancer [facility])    Left mastectomy, 2/11  . Carotid disease, bilateral ([place])    Followed by Dr. Leonie Man  . Carotid disease, bilateral ([place]) 10/21/2011  . Coronary artery disease    cabg 2007  . Diabetes mellitus   . Essential hypertension, benign 07/31/2009   Qualifier: Diagnosis of  By: Mare Ferrari, RMA, Sherri    . HLD (hyperlipidemia)   . Hypertension   . Neuromuscular disorder ([place])   . Neuropathic pain, leg   . Stroke [facility])    more than 10 yrs weakness rt leg  . Stroke, lacunar ([place])    Followed by Dr. Leonie Man  . T2DM (type 2 diabetes mellitus) ([place]) 03/23/2018    Current Outpatient Medications:  .  aspirin 81 MG chewable tablet, Chew 81 mg by mouth daily.  , Disp: , Rfl:  .  blood glucose meter kit and supplies KIT, Dispense based on patient and  insurance preference. Use up to four times daily as directed. (DxE11.9/E11.59), Disp: 1 each, Rfl: 0 .  Cholecalciferol (VITAMIN D) 2000 units CAPS, Take 1 capsule (2,000 Units total) by mouth daily., Disp: 90 capsule, Rfl: 1 .  clopidogrel (PLAVIX) 75 MG tablet, Take 1 tablet (75 mg total) by mouth daily., Disp: 90 tablet, Rfl: 2 .  DULoxetine (CYMBALTA) 30 MG capsule, TAKE 1 CAPSULE BY MOUTH EVERY DAY, Disp: 90 capsule, Rfl: 0 .  folic acid (FOLVITE) 1 MG tablet, Take 1 tablet (1 mg total) by mouth daily., Disp: 90 tablet, Rfl: 0 .  gabapentin (NEURONTIN) 400 MG capsule, TAKE 1 CAPSULE BY MOUTH THREE TIMES A DAY, Disp: 270 capsule, Rfl: 1 .  glucose blood test strip, Test blood sugar up to 4 times daily. Dx E11.9/ E11.59.  Meter/ strips per insurance/patient preference., Disp: 300 each, Rfl: 4 .  insulin aspart (NOVOLOG FLEXPEN) 100 UNIT/ML FlexPen, Inject 4 Units into the skin 2 (two) times daily with a meal., Disp: 15 mL, Rfl: 11 .  insulin degludec (TRESIBA FLEXTOUCH) 100 UNIT/ML SOPN FlexTouch Pen, Inject 16 Units into the skin every morning., Disp: , Rfl:  .  Lancets MISC, Use to test blood sugar up to 4 times daily (E11.9/E11.59), Disp: 300 each, Rfl: 4 .  lisinopril (PRINIVIL,ZESTRIL) 20 MG tablet, Take 1 tablet (20 mg total) by mouth daily., Disp: 90 tablet, Rfl: 3 .  metFORMIN (  GLUCOPHAGE) 1000 MG tablet, Take 1 tablet (1,000 mg total) by mouth 2 (two) times daily., Disp: 180 tablet, Rfl: 1 .  metoprolol tartrate (LOPRESSOR) 25 MG tablet, TAKE 1/2 TABLET (12.5 MG TOTAL) BY MOUTH 2 (TWO) TIMES DAILY., Disp: 90 tablet, Rfl: 1 .  naproxen sodium (ALEVE) 220 MG tablet, Take 220 mg by mouth as needed., Disp: , Rfl:  .  oxyCODONE-acetaminophen (PERCOCET) 10-325 MG tablet, Take 1 tablet by mouth 4 (four) times daily as needed for pain. , Disp: , Rfl:  .  pravastatin (PRAVACHOL) 40 MG tablet, Take 1 tablet (40 mg total) by mouth daily., Disp: 90 tablet, Rfl: 3  Assessment/ Plan: 68 y.o. female    1. DM type 2 causing vascular disease ([place]) Her blood sugar sounds stable with occasional outliers.  We will plan for recheck of A1c sometime this summer as her last A1c was above goal.  For now continue current regimen.  Refills of metformin sent in.  She did not require refills on her insulins at this time - metFORMIN (GLUCOPHAGE) 1000 MG tablet; Take 1 tablet (1,000 mg total) by mouth 2 (two) times daily.  Dispense: 180 tablet; Refill: 1  2. Hypertension associated with diabetes ([place]) Continue current regimen.  3. Hyperlipidemia associated with type 2 diabetes mellitus ([place]) Continue current regimen.   Start time: 3:01pm End time: 3:06pm  Total time spent on patient care (including telephone call/ virtual visit): 10 minutes  [place], [place] [phone]

## 2019-04-23 ENCOUNTER — Other Ambulatory Visit: Payer: Self-pay | Admitting: Family Medicine

## 2019-04-27 ENCOUNTER — Telehealth: Payer: Self-pay | Admitting: Family Medicine

## 2019-04-27 NOTE — Telephone Encounter (Signed)
Lab results discussed with pt's daughter appt scheduled for follow up elevated K+

## 2019-04-30 ENCOUNTER — Encounter: Payer: Self-pay | Admitting: Family Medicine

## 2019-04-30 ENCOUNTER — Other Ambulatory Visit: Payer: Self-pay

## 2019-04-30 ENCOUNTER — Ambulatory Visit (INDEPENDENT_AMBULATORY_CARE_PROVIDER_SITE_OTHER): Payer: Medicare Other | Admitting: Family Medicine

## 2019-04-30 VITALS — BP 118/65 | HR 53 | Temp 97.4°F | Ht 64.0 in | Wt 144.0 lb

## 2019-04-30 DIAGNOSIS — E1169 Type 2 diabetes mellitus with other specified complication: Secondary | ICD-10-CM | POA: Diagnosis not present

## 2019-04-30 DIAGNOSIS — E785 Hyperlipidemia, unspecified: Secondary | ICD-10-CM | POA: Diagnosis not present

## 2019-04-30 DIAGNOSIS — E1159 Type 2 diabetes mellitus with other circulatory complications: Secondary | ICD-10-CM

## 2019-04-30 DIAGNOSIS — I10 Essential (primary) hypertension: Secondary | ICD-10-CM | POA: Diagnosis not present

## 2019-04-30 DIAGNOSIS — I152 Hypertension secondary to endocrine disorders: Secondary | ICD-10-CM

## 2019-04-30 LAB — BAYER DCA HB A1C WAIVED: HB A1C (BAYER DCA - WAIVED): 7.8 % — ABNORMAL HIGH (ref <7.0)

## 2019-04-30 MED ORDER — TRESIBA FLEXTOUCH 100 UNIT/ML ~~LOC~~ SOPN: 18.0000 [IU] | PEN_INJECTOR | SUBCUTANEOUS | 2 refills | Status: DC

## 2019-04-30 MED ORDER — NOVOLOG FLEXPEN 100 UNIT/ML ~~LOC~~ SOPN: PEN_INJECTOR | SUBCUTANEOUS | 11 refills | Status: DC

## 2019-04-30 NOTE — Patient Instructions (Signed)
Your blood sugars in the morning are high.  I recommend that you increase the Tresiba to 18 units.   If your morning blood sugars continue to be high (more than 150) for 2 days straight, increase by 1 unit.    You can keep increasing by 1 unit every 2 days for morning blood sugars above 150.

## 2019-04-30 NOTE — Progress Notes (Addendum)
Subjective: CC: Follow-up diabetes, hypertension, hyperlipidemia PCP: Janora Norlander, DO [id number] [name] is a 68 y.o. female presenting to clinic today for:  1. Type 2 Diabetes with hypertension, hyperlipidemia:  Patient reports: High at home: 200s (this is been typical fasting blood sugar for the last 3 to 4 weeks); Low at home: 18 (occurred after taking 4 units of NovoLog with supper and is occurring about 2-3 times per week), Taking medication(s): Metformin 1000 mg twice daily, NovoLog 4 units with meals, Tresiba 16 units every morning, metoprolol twice daily, lisinopril 20 mg daily, Pravachol 40 mg daily  Last eye exam: UTD Last foot exam: UTD Last A1c:  Lab Results  Component Value Date   HGBA1C 7.4 (H) 11/28/2018   Nephropathy screen indicated?: on ACE-I Last flu, zoster and/or pneumovax: Needs pneumococcal vaccination Immunization History  Administered Date(s) Administered   Influenza,inj,Quad PF,6+ Mos 08/04/2018    ROS: Denies dizziness, chest pain, lower extremity edema, falls.  Some increased urine output with increased blood sugars.  No visual disturbance.    ROS: Per HPI  No Known Allergies Past Medical History:  Diagnosis Date   Anxiety    no rx   Arthritis    Cancer ([place])    Left mastectomy, 2/11   Carotid disease, bilateral ([place])    Followed by Dr. Leonie Man   Carotid disease, bilateral ([place]) 10/21/2011   Coronary artery disease    cabg 2007   Diabetes mellitus    Essential hypertension, benign 07/31/2009   Qualifier: Diagnosis of  By: Mare Ferrari, RMA, Sherri     HLD (hyperlipidemia)    Hypertension    Neuromuscular disorder ([place])    Neuropathic pain, leg    Stroke [facility])    more than 10 yrs weakness rt leg   Stroke, lacunar ([place])    Followed by Dr. Leonie Man   T2DM (type 2 diabetes mellitus) ([place]) 03/23/2018    Current Outpatient Medications:    aspirin 81 MG chewable tablet, Chew 81 mg by mouth daily.  , Disp: , Rfl:    blood  glucose meter kit and supplies KIT, Dispense based on patient and insurance preference. Use up to four times daily as directed. (DxE11.9/E11.59), Disp: 1 each, Rfl: 0   Cholecalciferol (VITAMIN D) 2000 units CAPS, Take 1 capsule (2,000 Units total) by mouth daily., Disp: 90 capsule, Rfl: 1   clopidogrel (PLAVIX) 75 MG tablet, Take 1 tablet (75 mg total) by mouth daily., Disp: 90 tablet, Rfl: 2   DULoxetine (CYMBALTA) 30 MG capsule, TAKE 1 CAPSULE BY MOUTH EVERY DAY, Disp: 90 capsule, Rfl: 0   folic acid (FOLVITE) 1 MG tablet, TAKE 1 TABLET BY MOUTH EVERY DAY, Disp: 90 tablet, Rfl: 0   gabapentin (NEURONTIN) 400 MG capsule, TAKE 1 CAPSULE BY MOUTH THREE TIMES A DAY, Disp: 270 capsule, Rfl: 1   glucose blood test strip, Test blood sugar up to 4 times daily. Dx E11.9/ E11.59.  Meter/ strips per insurance/patient preference., Disp: 300 each, Rfl: 4   insulin aspart (NOVOLOG FLEXPEN) 100 UNIT/ML FlexPen, Inject 4 Units into the skin 2 (two) times daily with a meal., Disp: 15 mL, Rfl: 11   insulin degludec (TRESIBA FLEXTOUCH) 100 UNIT/ML SOPN FlexTouch Pen, Inject 16 Units into the skin every morning., Disp: , Rfl:    Lancets MISC, Use to test blood sugar up to 4 times daily (E11.9/E11.59), Disp: 300 each, Rfl: 4   lisinopril (PRINIVIL,ZESTRIL) 20 MG tablet, Take 1 tablet (20 mg total) by mouth daily.,  Disp: 90 tablet, Rfl: 3   metFORMIN (GLUCOPHAGE) 1000 MG tablet, Take 1 tablet (1,000 mg total) by mouth 2 (two) times daily., Disp: 180 tablet, Rfl: 1   metoprolol tartrate (LOPRESSOR) 25 MG tablet, TAKE 1/2 TABLET (12.5 MG TOTAL) BY MOUTH 2 (TWO) TIMES DAILY., Disp: 90 tablet, Rfl: 1   naproxen sodium (ALEVE) 220 MG tablet, Take 220 mg by mouth as needed., Disp: , Rfl:    oxyCODONE-acetaminophen (PERCOCET) 10-325 MG tablet, Take 1 tablet by mouth 4 (four) times daily as needed for pain. , Disp: , Rfl:    pravastatin (PRAVACHOL) 40 MG tablet, Take 1 tablet (40 mg total) by mouth daily., Disp:  90 tablet, Rfl: 3 Social History   Socioeconomic History   Marital status: Widowed    Spouse name: Not on file   Number of children: Not on file   Years of education: Not on file   Highest education level: Not on file  Occupational History   Not on file  Social Needs   Financial resource strain: Not on file   Food insecurity    Worry: Not on file    Inability: Not on file   Transportation needs    Medical: Not on file    Non-medical: Not on file  Tobacco Use   Smoking status: Never Smoker   Smokeless tobacco: Never Used  Substance and Sexual Activity   Alcohol use: No    Alcohol/week: 0.0 standard drinks   Drug use: No   Sexual activity: Never    Birth control/protection: Post-menopausal  Lifestyle   Physical activity    Days per week: Not on file    Minutes per session: Not on file   Stress: Not on file  Relationships   Social connections    Talks on phone: Not on file    Gets together: Not on file    Attends religious service: Not on file    Active member of club or organization: Not on file    Attends meetings of clubs or organizations: Not on file    Relationship status: Not on file   Intimate partner violence    Fear of current or ex partner: Not on file    Emotionally abused: Not on file    Physically abused: Not on file    Forced sexual activity: Not on file  Other Topics Concern   Not on file  Social History Narrative   ** Merged History Encounter **       Family History  Problem Relation Age of Onset   Cancer Mother        breast    Stroke Mother    Diabetes Mother    Hypertension Mother    Heart attack Father    Diabetes Sister    Lung cancer Brother    Heart disease Brother     Objective: Office vital signs reviewed. BP 118/65    Pulse (!) 53    Temp (!) 97.4 F (36.3 C) (Oral)    Ht 5' 4"  (1.626 m)    Wt 144 lb (65.3 kg)    BMI 24.72 kg/m   Physical Examination:  General: Awake, alert, well nourished, No  acute distress HEENT: Normal, sclera white, MMM Cardio: Slightly bradycardic with regular rhythm, S1S2 heard, no murmurs appreciated Pulm: clear to auscultation bilaterally, no wheezes, rhonchi or rales; normal work of breathing on room air Extremities: warm, well perfused, No edema, cyanosis or clubbing; +2 pulses bilaterally MSK: Cane for ambulation  Assessment/  Plan: 68 y.o. female   1. DM type 2 causing vascular disease (HCC) Her A1c was 7.8 today.  She is not at goal but because she is having hypoglycemic episodes in the evening with hyperglycemia in the morning, I have advised her to increase her Tresiba to 18 units daily and eliminate her suppertime NovoLog since this seems to be when she is having hypoglycemic episodes.  Hopefully this will get help her have a little bit more stability and blood sugars.  We discussed that if she were to start having more frequent hypoglycemic episodes with the increase in [place] she is to contact me immediately.  Otherwise, plan to follow-up with me in 3 months, sooner if needed - Bayer DCA Hb A1c Waived  2. Hypertension associated with diabetes ([place]) We will check renal function and potassium level given history of hyperkalemia in the past. - CMP14+EGFR  3. Hyperlipidemia associated with type 2 diabetes mellitus ([place]) Continue Pravachol   Orders Placed This Encounter  Procedures   CMP14+EGFR   Bayer DCA Hb A1c Waived   Meds ordered this encounter  Medications   insulin degludec (TRESIBA FLEXTOUCH) 100 UNIT/ML SOPN FlexTouch Pen    Sig: Inject 0.18-0.24 mLs (18-24 Units total) into the skin every morning.    Dispense:  6 mL    Refill:  2   insulin aspart (NOVOLOG FLEXPEN) 100 UNIT/ML FlexPen    Sig: Inject 4 units once daily with largest meal of the day.    Dispense:  15 mL    Refill:  [place], [place] [phone]

## 2019-05-01 LAB — CMP14+EGFR
ALT: 20 IU/L (ref 0–32)
AST: 23 IU/L (ref 0–40)
Albumin/Globulin Ratio: 1.5 (ref 1.2–2.2)
Albumin: 3.5 g/dL — ABNORMAL LOW (ref 3.8–4.8)
Alkaline Phosphatase: 84 IU/L (ref 39–117)
BUN/Creatinine Ratio: 15 (ref 12–28)
BUN: 16 mg/dL (ref 8–27)
Bilirubin Total: 0.3 mg/dL (ref 0.0–1.2)
CO2: 23 mmol/L (ref 20–29)
Calcium: 9.5 mg/dL (ref 8.7–10.3)
Chloride: 107 mmol/L — ABNORMAL HIGH (ref 96–106)
Creatinine, Ser: 1.09 mg/dL — ABNORMAL HIGH (ref 0.57–1.00)
GFR calc Af Amer: 61 mL/min/{1.73_m2} (ref >59)
GFR calc non Af Amer: 53 mL/min/{1.73_m2} — ABNORMAL LOW (ref >59)
Globulin, Total: 2.4 g/dL (ref 1.5–4.5)
Glucose: 171 mg/dL — ABNORMAL HIGH (ref 65–99)
Potassium: 4.6 mmol/L (ref 3.5–5.2)
Sodium: 142 mmol/L (ref 134–144)
Total Protein: 5.9 g/dL — ABNORMAL LOW (ref 6.0–8.5)

## 2019-05-14 ENCOUNTER — Ambulatory Visit: Payer: Medicare Other | Admitting: Cardiology

## 2019-05-23 ENCOUNTER — Ambulatory Visit: Payer: Medicare Other

## 2019-05-24 ENCOUNTER — Other Ambulatory Visit: Payer: Self-pay | Admitting: Family Medicine

## 2019-06-11 ENCOUNTER — Other Ambulatory Visit: Payer: Self-pay | Admitting: Family Medicine

## 2019-06-11 NOTE — Telephone Encounter (Signed)
Next OV 08/02/19

## 2019-06-12 IMAGING — CT CT ABD-PELV W/ CM
2 of 5 series · 15 of 46 positions shown, 17 images · IV contrast (APPLIED)
Comparison: None.

CLINICAL DATA: Acute onset of periumbilical abdominal pain, with
nausea and vomiting. Initial encounter.

EXAM:
CT ABDOMEN AND PELVIS WITH CONTRAST
TECHNIQUE: Multidetector CT imaging of the abdomen and pelvis was performed
using the standard protocol following bolus administration of
intravenous contrast.
CONTRAST:  100mL LSLGCY-2EE IOPAMIDOL (LSLGCY-2EE) INJECTION 61%

[Series 3: abdomen 5.0 · axial · 0.68mm/px · z∈[+853,+1258]mm · 12 of 95 slices shown, 14 images]
[im 7/95  soft-tissue]
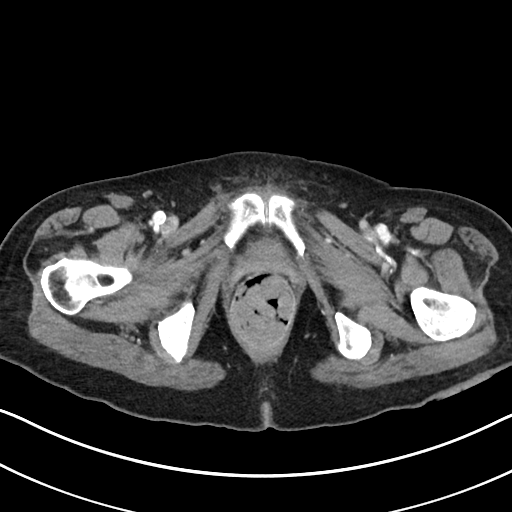
[im 7/95  bone]
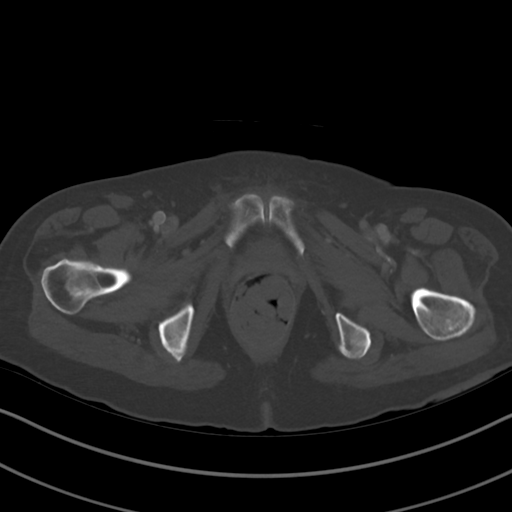
[im 14/95  soft-tissue]
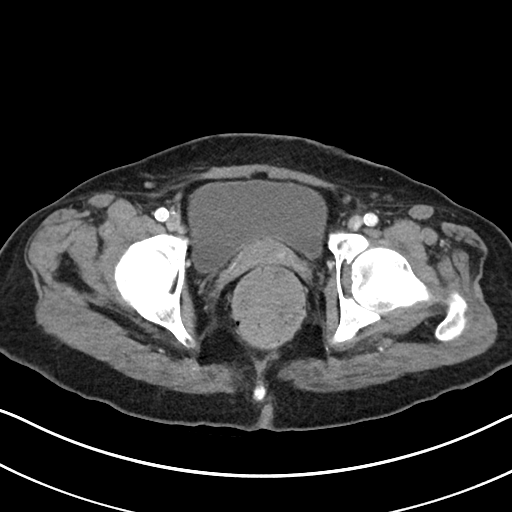
[im 21/95  soft-tissue]
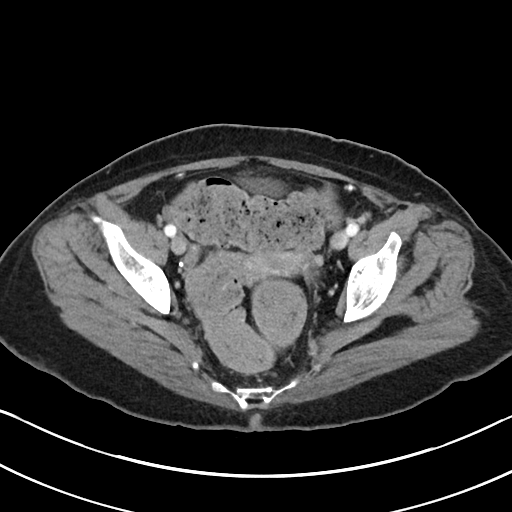
[im 27/95  soft-tissue]
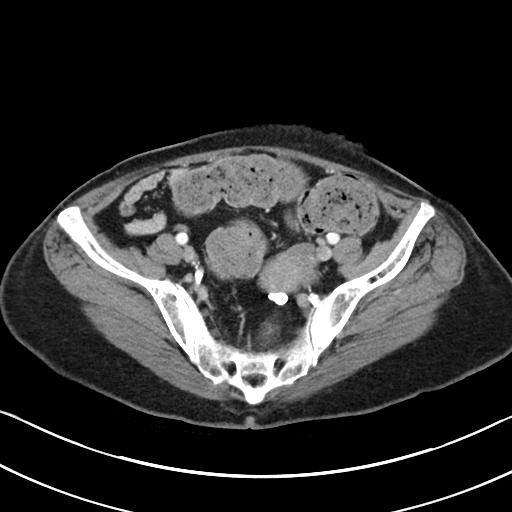
[im 34/95  soft-tissue]
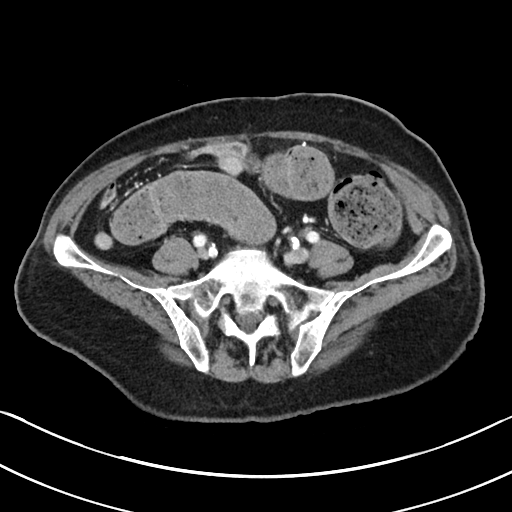
[im 41/95  soft-tissue]
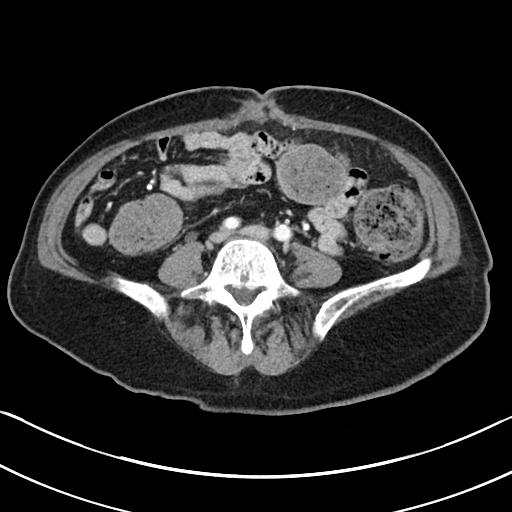
[im 54/95  soft-tissue]
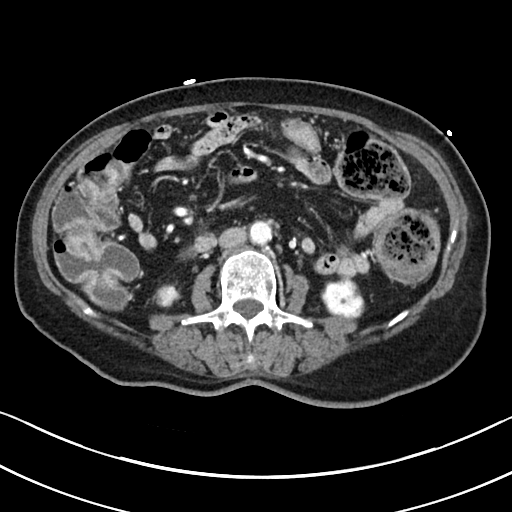
[im 61/95  soft-tissue]
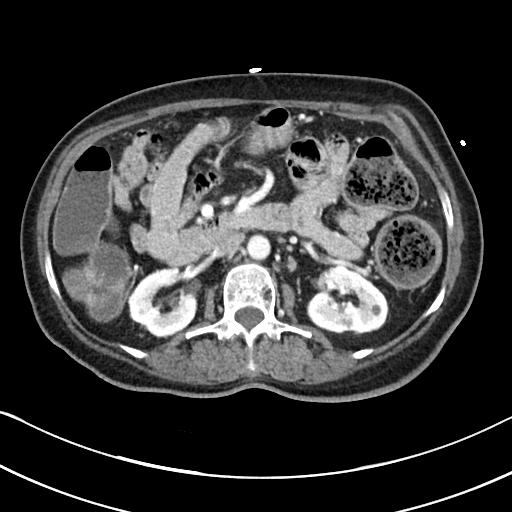
[im 68/95  soft-tissue]
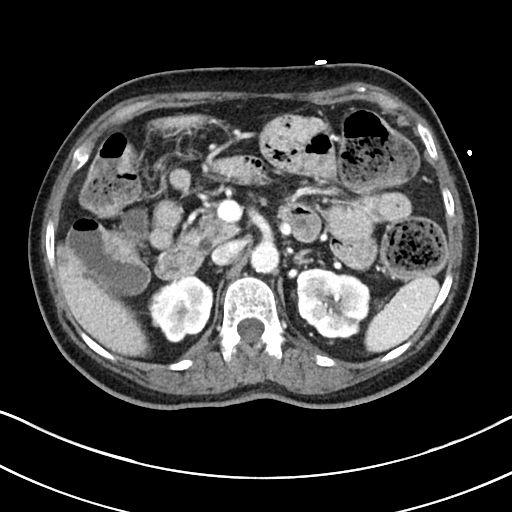
[im 68/95  bone]
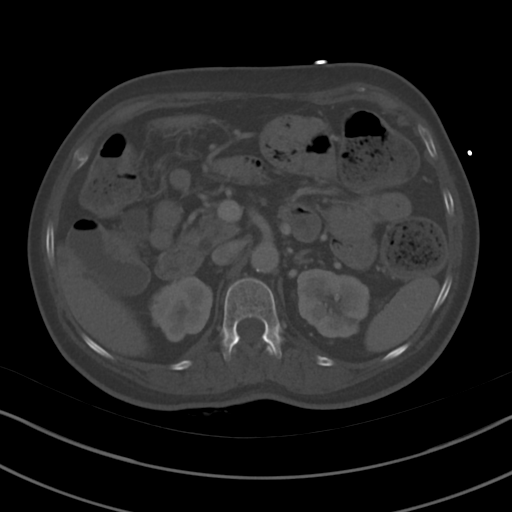
[im 74/95  soft-tissue]
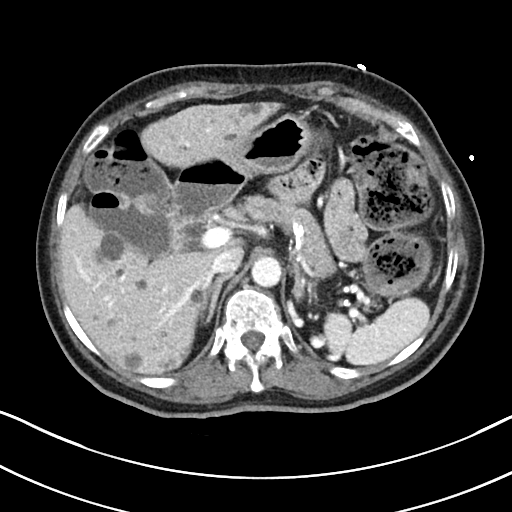
[im 81/95  soft-tissue]
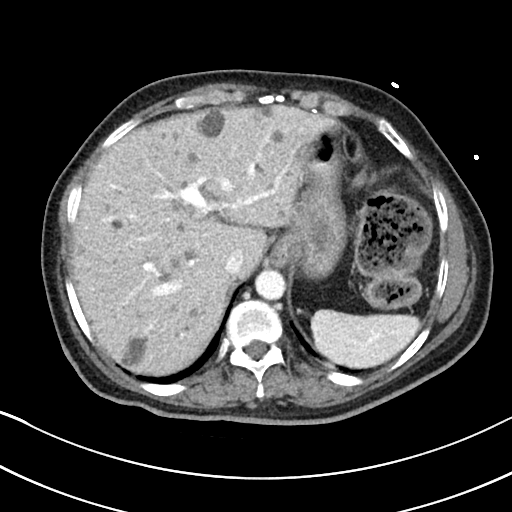
[im 88/95  soft-tissue]
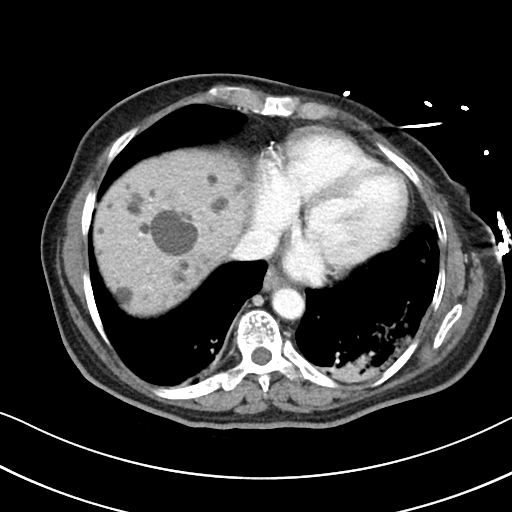

[Series 6: abdomen 3.0 mpr cor · coronal · 0.70mm/px · 3 of 74 slices shown]
[im 25/74  soft-tissue]
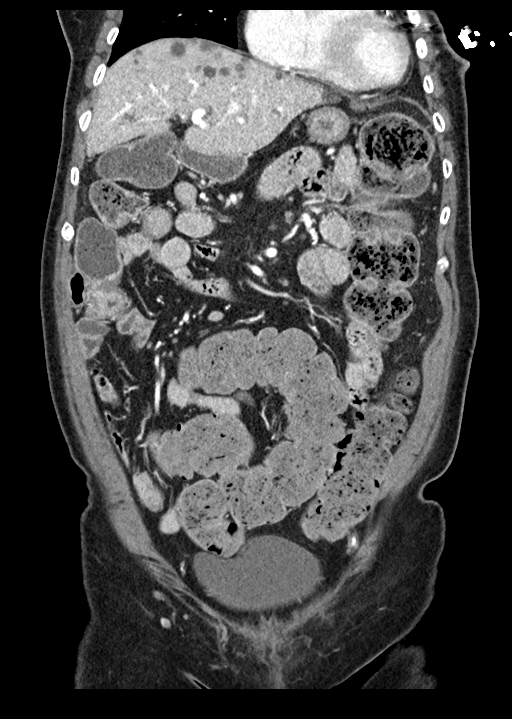
[im 33/74  soft-tissue]
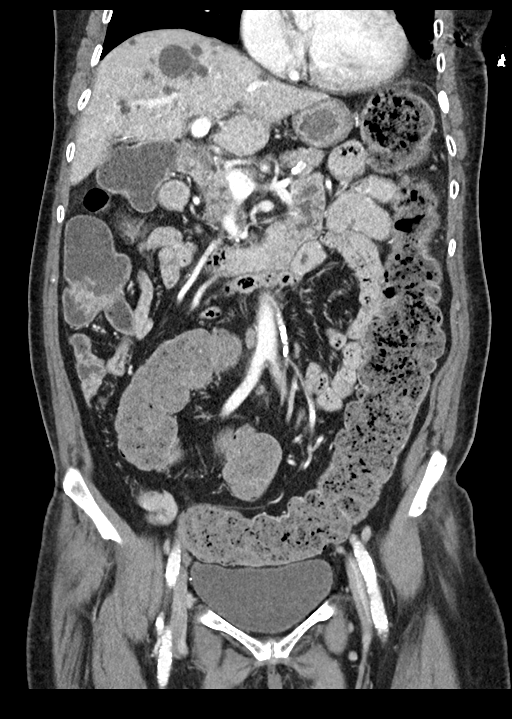
[im 41/74  soft-tissue]
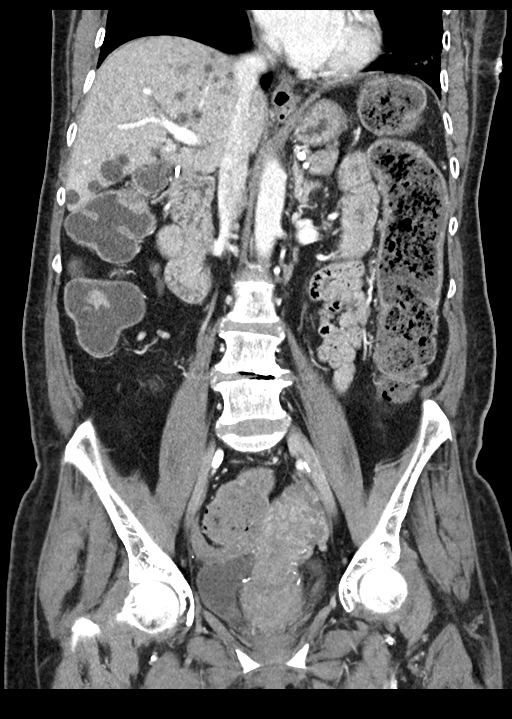

[15 of 46 positions shown; findings below may reference images not displayed]

FINDINGS: Lower chest: The patient is status post left-sided mastectomy. Mild
left basilar airspace opacity may reflect atelectasis or possibly
mild pneumonia. Diffuse coronary artery calcifications are seen. The
patient is status post median sternotomy.

Hepatobiliary: Numerous hepatic cysts are noted, of varying size.
The patient is status post cholecystectomy, with clips noted along
the gallbladder fossa. The common bile duct remains normal in
caliber, status post cholecystectomy.

Pancreas: The pancreas is within normal limits.

Spleen: The spleen is unremarkable in appearance.

Adrenals/Urinary Tract: The adrenal glands are unremarkable in
appearance.

Mild bilateral renal scarring is noted, with mild bilateral renal
atrophy. Scattered bilateral renal cysts are seen. Scattered
calcifications at the renal hila are thought to be vascular in
nature. There is no evidence of hydronephrosis. No definite renal or
ureteral stones are identified.

Stomach/Bowel: The stomach is unremarkable in appearance. The small
bowel is within normal limits. The appendix is normal in caliber,
without evidence of appendicitis.

A large amount of stool is noted at the descending and sigmoid
colon, concerning for constipation. The remainder of the colon is
grossly unremarkable.

Vascular/Lymphatic: Scattered calcification is seen along the
abdominal aorta and its branches. The abdominal aorta is otherwise
grossly unremarkable. The inferior vena cava is grossly
unremarkable. No retroperitoneal lymphadenopathy is seen. No pelvic
sidewall lymphadenopathy is identified.

Reproductive: The bladder is mildly distended and grossly
unremarkable. The uterus is unremarkable in appearance. The ovaries
are grossly symmetric. No suspicious adnexal masses are seen.

Other: No additional soft tissue abnormalities are seen. Mild
chronic postoperative inflammation is noted at the umbilicus.

Musculoskeletal: No acute osseous abnormalities are identified.
Vacuum phenomenon is noted at L4-L5, with endplate sclerotic change.
The visualized musculature is unremarkable in appearance.
IMPRESSION: 1. Large amount of stool at the descending and sigmoid colon,
concerning for constipation. Remainder of the colon is grossly
unremarkable.
2. Mild left basilar airspace opacity may reflect atelectasis or
possibly mild pneumonia.
3. Diffuse coronary artery calcifications seen.
4. Numerous hepatic cysts noted.
5. Mild bilateral renal scarring and mild bilateral renal atrophy.
Scattered bilateral renal cysts seen.
6. Scattered aortic atherosclerosis.

## 2019-06-25 ENCOUNTER — Encounter: Payer: Self-pay | Admitting: Cardiology

## 2019-06-25 ENCOUNTER — Ambulatory Visit: Payer: Medicare Other | Admitting: Cardiology

## 2019-06-25 ENCOUNTER — Ambulatory Visit (INDEPENDENT_AMBULATORY_CARE_PROVIDER_SITE_OTHER): Payer: Medicare Other | Admitting: Cardiology

## 2019-06-25 ENCOUNTER — Other Ambulatory Visit: Payer: Self-pay

## 2019-06-25 VITALS — BP 100/58 | HR 66 | Ht 64.0 in | Wt 144.6 lb

## 2019-06-25 DIAGNOSIS — I493 Ventricular premature depolarization: Secondary | ICD-10-CM

## 2019-06-25 DIAGNOSIS — I2510 Atherosclerotic heart disease of native coronary artery without angina pectoris: Secondary | ICD-10-CM | POA: Diagnosis not present

## 2019-06-25 DIAGNOSIS — I10 Essential (primary) hypertension: Secondary | ICD-10-CM | POA: Diagnosis not present

## 2019-06-25 DIAGNOSIS — E782 Mixed hyperlipidemia: Secondary | ICD-10-CM

## 2019-06-25 NOTE — Progress Notes (Signed)
Clinical Summary [name] is a 68 y.o.female seen today for follow up of the following medical problems.   1. CAD - hx of CABG in 2007, normal LVEF 60% - 02/2016 she had nuclear stress without clear ischemia.  - 02/2018 nuclear stress no ischemia   - chronic chest pains unchanged, still with pains in the morning  Better with aspirin. Symptoms are not exertional - compliant with meds  3. HL - she stopped lipitor due to dizziness.  - started pravastatin 45m daily, tolerating well 03/2018 TC 127 TG 42 HDL 63 LDL 56  - compliant with meds  4. Hx of CVA - she is on both ASA and plavix, she is unsure exactly why. Assume its related to her hx of CVA as no clear cardiac indication to be on plavix from records   5. Syncope - seen in ER 05/21/17 - initially low bp's by EMS, resolved with IVFs -elevated Cr and BUN from baseline supporting dehydration. - significant abdominal painat the time, CT showed severe constipation  -denies any recent issues  6. PVCs - noted atrecentpcp visit. - denies any palpitations.  - K 5.1 - 1-2 cups of coffee.No other caffeine  12/2017 holter with 44% ventricular burden, short runs of NSVT up to 5 beats - 01/2018 echo LVEF 50-55%, no WMAs - 02/2018 nuclear stress: no clear ischemia. Possible mid to apical anterior and basal inferoseptal scar.   -she denies any recent palpitations.   SH: husbandand a brotherpassed recently     Past Medical History:  Diagnosis Date  . Anxiety    no rx  . Arthritis   . Cancer ([facility]    Left mastectomy, 2/11  . Carotid disease, bilateral ([place]    Followed by Dr. SLeonie Man . Carotid disease, bilateral ([place] 10/21/2011  . Coronary artery disease    cabg 2007  . Diabetes mellitus   . Essential hypertension, benign 07/31/2009   Qualifier: Diagnosis of  By: FMare Ferrari RMA, Sherri    . HLD (hyperlipidemia)   . Hypertension   . [place]romuscular disorder ([place]   . Neuropathic pain, leg    . Stroke ([facility]    more than 10 yrs weakness rt leg  . [place]ar ([place]    Followed by Dr. SLeonie Man . T2DM (type 2 diabetes mellitus) ([place] 03/23/2018     No Known Allergies   Current Outpatient Medications  Medication Sig Dispense Refill  . aspirin 81 MG chewable tablet Chew 81 mg by mouth daily.      . blood glucose meter kit and supplies KIT Dispense based on patient and insurance preference. Use up to four times daily as directed. (DxE11.9/E11.59) 1 each 0  . Cholecalciferol (VITAMIN D) 2000 units CAPS Take 1 capsule (2,000 Units total) by mouth daily. 90 capsule 1  . clopidogrel (PLAVIX) 75 MG tablet Take 1 tablet (75 mg total) by mouth daily. 90 tablet 2  . DULoxetine (CYMBALTA) 30 MG capsule TAKE 1 CAPSULE BY MOUTH EVERY DAY 90 capsule 0  . folic acid (FOLVITE) 1 MG tablet TAKE 1 TABLET BY MOUTH EVERY DAY 90 tablet 0  . gabapentin (NEURONTIN) 400 MG capsule TAKE 1 CAPSULE BY MOUTH THREE TIMES A DAY 270 capsule 0  . glucose blood test strip Test blood sugar up to 4 times daily. Dx E11.9/ E11.59.  Meter/ strips per insurance/patient preference. 300 each 4  . hydrochlorothiazide (MICROZIDE) 12.5 MG capsule Take 12.5 mg by mouth daily.     .Marland Kitchen  insulin aspart (NOVOLOG FLEXPEN) 100 UNIT/ML FlexPen Inject 4 units once daily with largest meal of the day. 15 mL 11  . insulin degludec (TRESIBA FLEXTOUCH) 100 UNIT/ML SOPN FlexTouch Pen Inject 0.18-0.24 mLs (18-24 Units total) into the skin every morning. 6 mL 2  . Lancets MISC Use to test blood sugar up to 4 times daily (E11.9/E11.59) 300 each 4  . linaclotide (LINZESS) 145 MCG CAPS capsule Take 145 mcg by mouth daily before breakfast.     . lisinopril (PRINIVIL,ZESTRIL) 20 MG tablet Take 1 tablet (20 mg total) by mouth daily. 90 tablet 3  . metFORMIN (GLUCOPHAGE) 1000 MG tablet Take 1 tablet (1,000 mg total) by mouth 2 (two) times daily. 180 tablet 1  . metoprolol tartrate (LOPRESSOR) 25 MG tablet TAKE 1/2 TABLET (12.5 MG TOTAL) BY MOUTH 2  (TWO) TIMES DAILY. 90 tablet 1  . naproxen sodium (ALEVE) 220 MG tablet Take 220 mg by mouth as needed.    Marland Kitchen oxyCODONE (ROXICODONE) 15 MG immediate release tablet Take 1 tablet by mouth 4 (four) times daily as needed.    . pravastatin (PRAVACHOL) 40 MG tablet Take 1 tablet (40 mg total) by mouth daily. 90 tablet 3   No current facility-administered medications for this visit.      Past Surgical History:  Procedure Laterality Date  . arm fx Left 11  . CATARACT EXTRACTION W/PHACO Right 09/26/2017   Procedure: CATARACT EXTRACTION PHACO AND INTRAOCULAR LENS PLACEMENT (IOC);  Surgeon: Tonny Tyrann Donaho, MD;  Location: [facility];  Service: Ophthalmology;  Laterality: Right;  CDE: 8.52  . CATARACT EXTRACTION W/PHACO Left 10/31/2017   Procedure: CATARACT EXTRACTION PHACO AND INTRAOCULAR LENS PLACEMENT LEFT EYE CDE=7.75;  Surgeon: Tonny Algie Westry, MD;  Location: [facility];  Service: Ophthalmology;  Laterality: Left;  left  . CORONARY ARTERY BYPASS GRAFT  07  . EYE SURGERY Right    cateracts  . FRACTURE SURGERY Bilateral    bilateral wrist and right knee orif's  . INSERTION OF MESH N/A 04/05/2016   Procedure: INSERTION OF MESH;  Surgeon: Rolm Bookbinder, MD;  Location: [place];  Service: General;  Laterality: N/A;  . MASTECTOMY  2010   lt-nodes  . ORIF ULNAR FRACTURE  10/01/2011   Procedure: OPEN REDUCTION INTERNAL FIXATION (ORIF) ULNAR FRACTURE;  Surgeon: Kerin Salen;  Location: [place];  Service: Orthopedics;  Laterality: Right;  and orif scaphoid  . UMBILICAL HERNIA REPAIR  04/05/2016   with 6.4 cm ventrallex mesh  . UMBILICAL HERNIA REPAIR N/A 04/05/2016   Procedure: UMBILICAL HERNIA REPAIR;  Surgeon: Rolm Bookbinder, MD;  Location: [place];  Service: General;  Laterality: N/A;     No Known Allergies    Family History  Problem Relation Age of Onset  . Cancer Mother        breast   . Stroke Mother   . Diabetes Mother   . Hypertension Mother   . Heart attack Father   . Diabetes Sister   . Lung  cancer Brother   . Heart disease Brother      Social History [name] reports that she has never smoked. She has never used smokeless tobacco. [name] reports no history of alcohol use.   Review of Systems CONSTITUTIONAL: No weight loss, fever, chills, weakness or fatigue.  HEENT: Eyes: No visual loss, blurred vision, double vision or yellow sclerae.No hearing loss, sneezing, congestion, runny nose or sore throat.  SKIN: No rash or itching.  CARDIOVASCULAR: per hpi RESPIRATORY: No shortness of breath,  cough or sputum.  GASTROINTESTINAL: No anorexia, nausea, vomiting or diarrhea. No abdominal pain or blood.  GENITOURINARY: No burning on urination, no polyuria NEUROLOGICAL: No headache, dizziness, syncope, paralysis, ataxia, numbness or tingling in the extremities. No change in bowel or bladder control.  MUSCULOSKELETAL: No muscle, back pain, joint pain or stiffness.  LYMPHATICS: No enlarged nodes. No history of splenectomy.  PSYCHIATRIC: No history of depression or anxiety.  ENDOCRINOLOGIC: No reports of sweating, cold or heat intolerance. No polyuria or polydipsia.  Marland Kitchen   Physical Examination Vitals:   06/25/19 1440  BP: (!) 100/58  Pulse: 66  SpO2: 98%   Filed Weights   06/25/19 1440  Weight: 144 lb 9.6 oz (65.6 kg)    Gen: resting comfortably, no acute distress HEENT: no scleral icterus, pupils equal round and reactive, no palptable cervical adenopathy,  CV: RRR, no m/rg, no jvd Resp: Clear to auscultation bilaterally GI: abdomen is soft, non-tender, non-distended, normal bowel sounds, no hepatosplenomegaly MSK: extremities are warm, no edema.  Skin: warm, no rash Neuro:  no focal deficits Psych: appropriate affect   Diagnostic Studies  12/2014 echo Study Conclusions  - Left ventricle: The cavity size was normal. Systolic function was normal. The estimated ejection fraction was in the range of 50% to 55%. Very mild concentric hypertrophy. Posterior wall  measurement is overestimated. Wall motion was normal; there were no regional wall motion abnormalities. Doppler parameters are consistent with abnormal left ventricular relaxation (grade 1 diastolic dysfunction). Doppler parameters are consistent with high ventricular filling pressure. - Mitral valve: There was mild regurgitation. - Left atrium: The atrium was mildly dilated. Volume/bsa, S: 30.5 ml/m^2. - Right ventricle: Systolic function was mildly reduced. - Tricuspid valve: There was mild regurgitation.   05/2016 carotid US Elevated velocities RICA likely due to tortous vessel.    02/2016 Nuclear stress  There was no ST segment deviation noted during stress.  Findings consistent with prior small inferoseptal myocardial infarction with no current peri-infarct ischemia.  This is a low risk study. There is no myocardium currently at jeopardy.  The left ventricular ejection fraction is hyperdynamic (>65%).   12/2017 holter  Min HR 59, Max HR 122, Avg HR 81. Primary rhythm is sinus rhythm  No supraventricular ectopy  Very frequent ventricular ectopy (44%). In the the form of isolated PVCs, coupltes, bigeminy, trigeminy.Short runs of NSVT up to 5 beats  No diary submitted  No sustained arrhythmias   Assessment and Plan   1. CAD - chronic atypical chest pain symptoms unchanged, stress test 1 year ago without ischemic - continue current meds - EKG today SR, chronic ST/T changes   2. HTN - at goal, continue current meds  3. HL -did not tolerate high dose statin, now on pravastatin 40m daily -continue statin, PCP follows labs  4.PVCs -high burden based on prior holter monitor. Recent echo LVEF 50-55%, recent nuclear stress without current ischemia. Possibly due to prior scar related to her history of CAD and prior CABG.  - dynamap heart rates falsesly low due to PVCs.  - no symptoms, continue lopressor   F/u 6 months  JArnoldo Lenis M.D

## 2019-06-25 NOTE — Patient Instructions (Signed)

## 2019-07-10 ENCOUNTER — Other Ambulatory Visit: Payer: Self-pay | Admitting: Family Medicine

## 2019-07-10 DIAGNOSIS — E1159 Type 2 diabetes mellitus with other circulatory complications: Secondary | ICD-10-CM

## 2019-07-15 ENCOUNTER — Other Ambulatory Visit: Payer: Self-pay | Admitting: Family Medicine

## 2019-08-02 ENCOUNTER — Ambulatory Visit: Payer: Medicare Other | Admitting: Family Medicine

## 2019-08-09 ENCOUNTER — Other Ambulatory Visit: Payer: Self-pay

## 2019-08-10 ENCOUNTER — Encounter: Payer: Self-pay | Admitting: Family Medicine

## 2019-08-10 ENCOUNTER — Ambulatory Visit (INDEPENDENT_AMBULATORY_CARE_PROVIDER_SITE_OTHER): Payer: Medicare Other | Admitting: Family Medicine

## 2019-08-10 VITALS — BP 108/60 | HR 59 | Temp 97.8°F | Ht 64.0 in | Wt 159.0 lb

## 2019-08-10 DIAGNOSIS — Z23 Encounter for immunization: Secondary | ICD-10-CM | POA: Diagnosis not present

## 2019-08-10 DIAGNOSIS — E1159 Type 2 diabetes mellitus with other circulatory complications: Secondary | ICD-10-CM

## 2019-08-10 DIAGNOSIS — N179 Acute kidney failure, unspecified: Secondary | ICD-10-CM | POA: Diagnosis not present

## 2019-08-10 DIAGNOSIS — E1169 Type 2 diabetes mellitus with other specified complication: Secondary | ICD-10-CM | POA: Diagnosis not present

## 2019-08-10 DIAGNOSIS — E785 Hyperlipidemia, unspecified: Secondary | ICD-10-CM

## 2019-08-10 DIAGNOSIS — I10 Essential (primary) hypertension: Secondary | ICD-10-CM

## 2019-08-10 LAB — BAYER DCA HB A1C WAIVED: HB A1C (BAYER DCA - WAIVED): 6.5 % (ref <7.0)

## 2019-08-10 MED ORDER — GABAPENTIN 400 MG PO CAPS: 400.0000 mg | ORAL_CAPSULE | Freq: Three times a day (TID) | ORAL | 1 refills | Status: DC

## 2019-08-10 MED ORDER — DULOXETINE HCL 30 MG PO CPEP: 30.0000 mg | ORAL_CAPSULE | Freq: Every day | ORAL | 1 refills | Status: DC

## 2019-08-10 MED ORDER — TRESIBA FLEXTOUCH 100 UNIT/ML ~~LOC~~ SOPN: 18.0000 [IU] | PEN_INJECTOR | SUBCUTANEOUS | 1 refills | Status: DC

## 2019-08-10 MED ORDER — CLOPIDOGREL BISULFATE 75 MG PO TABS: 75.0000 mg | ORAL_TABLET | Freq: Every day | ORAL | 2 refills | Status: DC

## 2019-08-10 MED ORDER — METFORMIN HCL 1000 MG PO TABS: 1000.0000 mg | ORAL_TABLET | Freq: Two times a day (BID) | ORAL | 1 refills | Status: DC

## 2019-08-10 NOTE — Progress Notes (Signed)
Subjective: CC: Follow-up diabetes, hypertension, hyperlipidemia PCP: Janora Norlander, DO [id number] [name] is a 68 y.o. female presenting to clinic today for:  1. Type 2 Diabetes with hypertension, hyperlipidemia:  Patient was seen in June and was having intermittent episodes of hypoglycemia.  At that time she was advised to discontinue nighttime NovoLog and increase [place] instead.  She reports fasting blood sugars are running around 110-112.  She is had no recurrent hypoglycemic episodes.  Taking medication(s): Metformin 1000 mg twice daily, NovoLog 4 units with meals except suppertime, Tresiba 18units every morning, metoprolol twice daily, lisinopril 20 mg daily, Pravachol 40 mg daily  Last eye exam: UTD Last foot exam: UTD Last A1c:  Lab Results  Component Value Date   HGBA1C 7.8 (H) 04/30/2019   Nephropathy screen indicated?: on ACE-I Last flu, zoster and/or pneumovax: Needs pneumococcal vaccination, flu shot Immunization History  Administered Date(s) Administered  . Influenza,inj,Quad PF,6+ Mos 08/04/2018    ROS: No chest pain, shortness of breath, falls.  No blurry vision.   No Known Allergies Past Medical History:  Diagnosis Date  . Anxiety    no rx  . Arthritis   . Cancer [facility])    Left mastectomy, 2/11  . Carotid disease, bilateral ([place])    Followed by Dr. Leonie Man  . Carotid disease, bilateral ([place]) 10/21/2011  . Coronary artery disease    cabg 2007  . Diabetes mellitus   . Essential hypertension, benign 07/31/2009   Qualifier: Diagnosis of  By: Mare Ferrari, RMA, Sherri    . HLD (hyperlipidemia)   . Hypertension   . Neuromuscular disorder ([place])   . Neuropathic pain, leg   . Stroke [facility])    more than 10 yrs weakness rt leg  . Stroke, lacunar ([place])    Followed by Dr. Leonie Man  . T2DM (type 2 diabetes mellitus) ([place]) 03/23/2018    Current Outpatient Medications:  .  aspirin 81 MG chewable tablet, Chew 81 mg by mouth daily.  , Disp: , Rfl:  .  blood glucose  meter kit and supplies KIT, Dispense based on patient and insurance preference. Use up to four times daily as directed. (DxE11.9/E11.59), Disp: 1 each, Rfl: 0 .  Cholecalciferol (VITAMIN D) 2000 units CAPS, Take 1 capsule (2,000 Units total) by mouth daily., Disp: 90 capsule, Rfl: 1 .  clopidogrel (PLAVIX) 75 MG tablet, Take 1 tablet (75 mg total) by mouth daily., Disp: 90 tablet, Rfl: 2 .  DULoxetine (CYMBALTA) 30 MG capsule, TAKE 1 CAPSULE BY MOUTH EVERY DAY, Disp: 90 capsule, Rfl: 0 .  folic acid (FOLVITE) 1 MG tablet, TAKE 1 TABLET BY MOUTH EVERY DAY, Disp: 90 tablet, Rfl: 0 .  gabapentin (NEURONTIN) 400 MG capsule, TAKE 1 CAPSULE BY MOUTH THREE TIMES A DAY, Disp: 270 capsule, Rfl: 0 .  glucose blood test strip, Test blood sugar up to 4 times daily. Dx E11.9/ E11.59.  Meter/ strips per insurance/patient preference., Disp: 300 each, Rfl: 4 .  hydrochlorothiazide (MICROZIDE) 12.5 MG capsule, Take 12.5 mg by mouth daily. , Disp: , Rfl:  .  insulin aspart (NOVOLOG FLEXPEN) 100 UNIT/ML FlexPen, Inject 4 units once daily with largest meal of the day., Disp: 15 mL, Rfl: 11 .  Lancets MISC, Use to test blood sugar up to 4 times daily (E11.9/E11.59), Disp: 300 each, Rfl: 4 .  linaclotide (LINZESS) 145 MCG CAPS capsule, Take 145 mcg by mouth daily before breakfast. , Disp: , Rfl:  .  lisinopril (PRINIVIL,ZESTRIL) 20 MG tablet, Take 1  tablet (20 mg total) by mouth daily., Disp: 90 tablet, Rfl: 3 .  metFORMIN (GLUCOPHAGE) 1000 MG tablet, Take 1 tablet (1,000 mg total) by mouth 2 (two) times daily., Disp: 180 tablet, Rfl: 1 .  metoprolol tartrate (LOPRESSOR) 25 MG tablet, TAKE 1/2 TABLET (12.5 MG TOTAL) BY MOUTH 2 (TWO) TIMES DAILY., Disp: 90 tablet, Rfl: 1 .  naproxen sodium (ALEVE) 220 MG tablet, Take 220 mg by mouth as needed., Disp: , Rfl:  .  oxyCODONE (ROXICODONE) 15 MG immediate release tablet, Take 1 tablet by mouth 4 (four) times daily as needed., Disp: , Rfl:  .  pravastatin (PRAVACHOL) 40 MG  tablet, Take 1 tablet (40 mg total) by mouth daily., Disp: 90 tablet, Rfl: 3 .  TRESIBA FLEXTOUCH 100 UNIT/ML SOPN FlexTouch Pen, INJECT 0.18-0.24 MLS (18-24 UNITS TOTAL) INTO THE SKIN EVERY MORNING., Disp: 9 mL, Rfl: 0 Social History   Socioeconomic History  . Marital status: Widowed    Spouse name: Not on file  . Number of children: Not on file  . Years of education: Not on file  . Highest education level: Not on file  Occupational History  . Not on file  Social Needs  . Financial resource strain: Not on file  . Food insecurity    Worry: Not on file    Inability: Not on file  . Transportation needs    Medical: Not on file    Non-medical: Not on file  Tobacco Use  . Smoking status: Never Smoker  . Smokeless tobacco: Never Used  Substance and Sexual Activity  . Alcohol use: No    Alcohol/week: 0.0 standard drinks  . Drug use: No  . Sexual activity: Never    Birth control/protection: Post-menopausal  Lifestyle  . Physical activity    Days per week: Not on file    Minutes per session: Not on file  . Stress: Not on file  Relationships  . Social Herbalist on phone: Not on file    Gets together: Not on file    Attends religious service: Not on file    Active member of club or organization: Not on file    Attends meetings of clubs or organizations: Not on file    Relationship status: Not on file  . Intimate partner violence    Fear of current or ex partner: Not on file    Emotionally abused: Not on file    Physically abused: Not on file    Forced sexual activity: Not on file  Other Topics Concern  . Not on file  Social History Narrative   ** Merged History Encounter **       Family History  Problem Relation Age of Onset  . Cancer Mother        breast   . Stroke Mother   . Diabetes Mother   . Hypertension Mother   . Heart attack Father   . Diabetes Sister   . Lung cancer Brother   . Heart disease Brother     Objective: Office vital signs  reviewed. BP 108/60   Pulse (!) 59   Temp 97.8 F (36.6 C) (Temporal)   Ht 5' 4" (1.626 m)   Wt 159 lb (72.1 kg)   SpO2 97%   BMI 27.29 kg/m   Physical Examination:  General: Awake, alert, well nourished, No acute distress HEENT: Normal, sclera white, MMM Cardio: Slightly bradycardic with regular rhythm, S1S2 heard, no murmurs appreciated Pulm: clear to auscultation bilaterally, no  wheezes, rhonchi or rales; normal work of breathing on room air Extremities: warm, well perfused, No edema, cyanosis or clubbing; +2 pulses bilaterally MSK: Uses cane for ambulation typically but does not have it with her today.  She has a slow, stiff gait.  Assessment/ Plan: 68 y.o. female   1. DM type 2 causing vascular disease ([place]) Now controlled with A1c of 6.5.  Advised her to continue current regimen.  Follow-up in 3 months. - Bayer DCA Hb A1c Waived - metFORMIN (GLUCOPHAGE) 1000 MG tablet; Take 1 tablet (1,000 mg total) by mouth 2 (two) times daily.  Dispense: 180 tablet; Refill: 1 - insulin degludec (TRESIBA FLEXTOUCH) 100 UNIT/ML SOPN FlexTouch Pen; Inject 0.18 mLs (18 Units total) into the skin every morning.  Dispense: 9 mL; Refill: 1  2. Hypertension associated with diabetes ([place]) Controlled.  Continue current regimen  3. Hyperlipidemia associated with type 2 diabetes mellitus (HCC) Check fasting lipid panel - Lipid Panel  4. AKI (acute kidney injury) ([place]) I will check BMP to evaluate for creatinine level - Basic Metabolic Panel  Handicap placard form completed for the patient returned to her. Orders Placed This Encounter  Procedures  . Bayer DCA Hb A1c Waived  . Basic Metabolic Panel  . Lipid Panel   Meds ordered this encounter  Medications  . metFORMIN (GLUCOPHAGE) 1000 MG tablet    Sig: Take 1 tablet (1,000 mg total) by mouth 2 (two) times daily.    Dispense:  180 tablet    Refill:  1  . clopidogrel (PLAVIX) 75 MG tablet    Sig: Take 1 tablet (75 mg total) by mouth  daily.    Dispense:  90 tablet    Refill:  2  . DULoxetine (CYMBALTA) 30 MG capsule    Sig: Take 1 capsule (30 mg total) by mouth daily.    Dispense:  90 capsule    Refill:  1  . gabapentin (NEURONTIN) 400 MG capsule    Sig: Take 1 capsule (400 mg total) by mouth 3 (three) times daily.    Dispense:  270 capsule    Refill:  1    RX IS OUT OF REFILLS  . insulin degludec (TRESIBA FLEXTOUCH) 100 UNIT/ML SOPN FlexTouch Pen    Sig: Inject 0.18 mLs (18 Units total) into the skin every morning.    Dispense:  9 mL    Refill:  [place], DO [place] [phone]

## 2019-08-10 NOTE — Patient Instructions (Signed)
You had labs performed today.  You will be contacted with the results of the labs once they are available, usually in the next 3 business days for routine lab work.  If you have an active my chart account, they will be released to your MyChart.  If you prefer to have these labs released to you via telephone, please let us know.  If you had a pap smear or biopsy performed, expect to be contacted in about 7-10 days.  Your sugar looks great today!  Keep up the good work.

## 2019-08-11 LAB — LIPID PANEL
Chol/HDL Ratio: 2.2 ratio (ref 0.0–4.4)
Cholesterol, Total: 121 mg/dL (ref 100–199)
HDL: 56 mg/dL (ref >39)
LDL Chol Calc (NIH): 53 mg/dL (ref 0–99)
Triglycerides: 53 mg/dL (ref 0–149)
VLDL Cholesterol Cal: 12 mg/dL (ref 5–40)

## 2019-08-11 LAB — BASIC METABOLIC PANEL
BUN/Creatinine Ratio: 27 (ref 12–28)
BUN: 22 mg/dL (ref 8–27)
CO2: 21 mmol/L (ref 20–29)
Calcium: 10.5 mg/dL — ABNORMAL HIGH (ref 8.7–10.3)
Chloride: 113 mmol/L — ABNORMAL HIGH (ref 96–106)
Creatinine, Ser: 0.83 mg/dL (ref 0.57–1.00)
GFR calc Af Amer: 84 mL/min/{1.73_m2} (ref >59)
GFR calc non Af Amer: 73 mL/min/{1.73_m2} (ref >59)
Glucose: 72 mg/dL (ref 65–99)
Potassium: 4.8 mmol/L (ref 3.5–5.2)
Sodium: 145 mmol/L — ABNORMAL HIGH (ref 134–144)

## 2019-08-13 NOTE — Addendum Note (Signed)
Addended byCarrolyn Leigh on: 08/13/2019 12:34 PM   Modules accepted: Orders

## 2019-09-24 ENCOUNTER — Other Ambulatory Visit: Payer: Self-pay | Admitting: Cardiology

## 2019-10-15 ENCOUNTER — Other Ambulatory Visit: Payer: Self-pay | Admitting: Family Medicine

## 2019-12-16 ENCOUNTER — Other Ambulatory Visit: Payer: Self-pay | Admitting: Family Medicine

## 2020-01-07 ENCOUNTER — Encounter: Payer: Self-pay | Admitting: Cardiology

## 2020-01-07 ENCOUNTER — Telehealth (INDEPENDENT_AMBULATORY_CARE_PROVIDER_SITE_OTHER): Payer: Medicare Other | Admitting: Cardiology

## 2020-01-07 ENCOUNTER — Telehealth: Payer: Self-pay | Admitting: Licensed Clinical Social Worker

## 2020-01-07 VITALS — Ht 64.0 in | Wt 147.0 lb

## 2020-01-07 DIAGNOSIS — I10 Essential (primary) hypertension: Secondary | ICD-10-CM

## 2020-01-07 DIAGNOSIS — I2510 Atherosclerotic heart disease of native coronary artery without angina pectoris: Secondary | ICD-10-CM | POA: Diagnosis not present

## 2020-01-07 DIAGNOSIS — E782 Mixed hyperlipidemia: Secondary | ICD-10-CM

## 2020-01-07 DIAGNOSIS — I493 Ventricular premature depolarization: Secondary | ICD-10-CM

## 2020-01-07 NOTE — Telephone Encounter (Signed)
CSW referred to assist patient with obtaining a BP cuff. CSW contacted patient to inform cuff will be delivered to home. Patient grateful for support and assistance. CSW available as needed. Jackie Harolyn Cocker, LCSW, CCSW-MCS [phone]  

## 2020-01-07 NOTE — Patient Instructions (Addendum)
Medication Instructions:   Your physician recommends that you continue on your current medications as directed. Please refer to the Current Medication list given to you today.  Labwork:  NONE  Testing/Procedures:  NONE  Follow-Up:  Your physician recommends that you schedule a follow-up appointment in: 1 year (office or virtual). You will receive a reminder letter in the mail in about 10 months reminding you to call and schedule your appointment. If you don't receive this letter, please contact our office.  Any Other Special Instructions Will Be Listed Below (If Applicable).  If you need a refill on your cardiac medications before your next appointment, please call your pharmacy. 

## 2020-01-07 NOTE — Progress Notes (Signed)
Virtual Visit via Telephone Note   This visit type was conducted due to national recommendations for restrictions regarding the COVID-19 Pandemic (e.g. social distancing) in an effort to limit this patient's exposure and mitigate transmission in our community.  Due to her co-morbid illnesses, this patient is at least at moderate risk for complications without adequate follow up.  This format is felt to be most appropriate for this patient at this time.  The patient did not have access to video technology/had technical difficulties with video requiring transitioning to audio format only (telephone).  All issues noted in this document were discussed and addressed.  No physical exam could be performed with this format.  Please refer to the patient's chart for her  consent to telehealth for [facility].   Date:  01/07/2020   ID:  [name], DOB December 04, 1950, MRN [id number]  Patient Location: Home Provider Location: Office  PCP:  Janora Norlander, DO  Cardiologist:  Carlyle Dolly, MD  Electrophysiologist:  None   Evaluation Performed:  Follow-Up Visit  Chief Complaint:  Follow up  History of Present Illness:    [name] is a 69 y.o. female seen today for follow up of the following medical problems.   1. CAD - hx of CABG in 2007, normal LVEF 60% - 02/2016 she had nuclear stress without clear ischemia.  - 02/2018 nuclear stress no ischemia    - no recent chest pain - no SOB or DOE - compliant with meds   2. HL - she stopped lipitor due to dizziness.  - started pravastatin , tolerating well  - compliant with meds - 07/2019 TC 121 TG 53 HDL 56 LDL 53  4. Hx of CVA - she is on both ASA and plavix, she is unsure exactly why. Assume its related to her hx of CVA as no clear cardiac indication to be on plavix from records   5. Syncope - seen in ER 05/21/17 - initially low bp's by EMS, resolved with IVFs -elevated Cr and BUN from baseline supporting  dehydration. - significant abdominal painat the time, CT showed severe constipation  - no recent episodes  6. PVCs - noted atrecentpcp visit. - denies any palpitations.  - K 5.1 - 1-2 cups of coffee.No other caffeine  12/2017 holter with 44% ventricular burden, short runs of NSVT up to 5 beats - 01/2018 echo LVEF 50-55%, no WMAs - 02/2018 nuclear stress: no clear ischemia. Possible mid to apical anterior and basal inferoseptal scar.   -no recent palpitations     SH: husbandand a brotherpassed recently    The patient does not have symptoms concerning for COVID-19 infection (fever, chills, cough, or new shortness of breath).    Past Medical History:  Diagnosis Date  . Anxiety    no rx  . Arthritis   . Cancer [facility])    Left mastectomy, 2/11  . Carotid disease, bilateral ([place])    Followed by Dr. Leonie Man  . Carotid disease, bilateral ([place]) 10/21/2011  . Coronary artery disease    cabg 2007  . Diabetes mellitus   . Essential hypertension, benign 07/31/2009   Qualifier: Diagnosis of  By: Mare Ferrari, RMA, Sherri    . HLD (hyperlipidemia)   . Hypertension   . Neuromuscular disorder ([place])   . Neuropathic pain, leg   . Stroke [facility])    more than 10 yrs weakness rt leg  . Stroke, lacunar ([place])    Followed by Dr. Leonie Man  . T2DM (type  2 diabetes mellitus) ([place]) 03/23/2018   Past Surgical History:  Procedure Laterality Date  . arm fx Left 11  . CATARACT EXTRACTION W/PHACO Right 09/26/2017   Procedure: CATARACT EXTRACTION PHACO AND INTRAOCULAR LENS PLACEMENT (IOC);  Surgeon: Tonny Jaleeyah Munce, MD;  Location: [facility];  Service: Ophthalmology;  Laterality: Right;  CDE: 8.52  . CATARACT EXTRACTION W/PHACO Left 10/31/2017   Procedure: CATARACT EXTRACTION PHACO AND INTRAOCULAR LENS PLACEMENT LEFT EYE CDE=7.75;  Surgeon: Tonny Sravya Grissom, MD;  Location: [facility];  Service: Ophthalmology;  Laterality: Left;  left  . CORONARY ARTERY BYPASS GRAFT  07  . EYE SURGERY Right    cateracts  .  FRACTURE SURGERY Bilateral    bilateral wrist and right knee orif's  . INSERTION OF MESH N/A 04/05/2016   Procedure: INSERTION OF MESH;  Surgeon: Rolm Bookbinder, MD;  Location: [place];  Service: General;  Laterality: N/A;  . MASTECTOMY  2010   lt-nodes  . ORIF ULNAR FRACTURE  10/01/2011   Procedure: OPEN REDUCTION INTERNAL FIXATION (ORIF) ULNAR FRACTURE;  Surgeon: Kerin Salen;  Location: [place];  Service: Orthopedics;  Laterality: Right;  and orif scaphoid  . UMBILICAL HERNIA REPAIR  04/05/2016   with 6.4 cm ventrallex mesh  . UMBILICAL HERNIA REPAIR N/A 04/05/2016   Procedure: UMBILICAL HERNIA REPAIR;  Surgeon: Rolm Bookbinder, MD;  Location: [place];  Service: General;  Laterality: N/A;     No outpatient medications have been marked as taking for the 01/07/20 encounter (Appointment) with Arnoldo Lenis, MD.     Allergies:   Patient has no known allergies.   Social History   Tobacco Use  . Smoking status: Never Smoker  . Smokeless tobacco: Never Used  Substance Use Topics  . Alcohol use: No    Alcohol/week: 0.0 standard drinks  . Drug use: No     Family Hx: The patient's family history includes Cancer in her mother; Diabetes in her mother and sister; Heart attack in her father; Heart disease in her brother; Hypertension in her mother; Lung cancer in her brother; Stroke in her mother.  ROS:   Please see the history of present illness.     All other systems reviewed and are negative.   Prior CV studies:   The following studies were reviewed today:  12/2014 echo Study Conclusions  - Left ventricle: The cavity size was normal. Systolic function was normal. The estimated ejection fraction was in the range of 50% to 55%. Very mild concentric hypertrophy. Posterior wall measurement is overestimated. Wall motion was normal; there were no regional wall motion abnormalities. Doppler parameters are consistent with abnormal left ventricular relaxation (grade  1 diastolic dysfunction). Doppler parameters are consistent with high ventricular filling pressure. - Mitral valve: There was mild regurgitation. - Left atrium: The atrium was mildly dilated. Volume/bsa, S: 30.5 ml/m^2. - Right ventricle: Systolic function was mildly reduced. - Tricuspid valve: There was mild regurgitation.   05/2016 carotid US Elevated velocities RICA likely due to tortous vessel.    02/2016 Nuclear stress  There was no ST segment deviation noted during stress.  Findings consistent with prior small inferoseptal myocardial infarction with no current peri-infarct ischemia.  This is a low risk study. There is no myocardium currently at jeopardy.  The left ventricular ejection fraction is hyperdynamic (>65%).   12/2017 holter  Min HR 59, Max HR 122, Avg HR 81. Primary rhythm is sinus rhythm  No supraventricular ectopy  Very frequent ventricular ectopy (44%). In the the form of  isolated PVCs, coupltes, bigeminy, trigeminy.Short runs of NSVT up to 5 beats  No diary submitted  No sustained arrhythmias   Labs/Other Tests and Data Reviewed:    EKG:  No ECG reviewed.  Recent Labs: 04/30/2019: ALT 20 08/10/2019: BUN 22; Creatinine, Ser 0.83; Potassium 4.8; Sodium 145   Recent Lipid Panel Lab Results  Component Value Date/Time   CHOL 121 08/10/2019 03:20 PM   TRIG 53 08/10/2019 03:20 PM   HDL 56 08/10/2019 03:20 PM   CHOLHDL 2.2 08/10/2019 03:20 PM   LDLCALC 53 08/10/2019 03:20 PM    Wt Readings from Last 3 Encounters:  08/10/19 159 lb (72.1 kg)  06/25/19 144 lb 9.6 oz (65.6 kg)  04/30/19 144 lb (65.3 kg)     Objective:    Vital Signs:   Today's Vitals   01/07/20 1024  Weight: 147 lb (66.7 kg)  Height: 5\' 4"  (1.626 m)   Body mass index is 25.23 kg/m. Normal affect. Normal speech pattern and tone. Comfortable, no apparent distress. No audible signs of SOB or wheezing.   ASSESSMENT & PLAN:    1. CAD - no recent symptoms,  continue current meds   2. HTN - continue current meds  3. HL -did not tolerate high dose statin, now on pravastatin  - lipids at goal, continue current meds  4.PVCs -high burden based on prior holter monitor. Recent echo LVEF 50-55%, recent nuclear stress without current ischemia. Possibly due to prior scar related to her history of CAD and prior CABG.  - dynamap heart rates falsesly low due to PVCs.  - remains asymptomatic, continue to monitor.    F/u 1 year  COVID-19 Education: The signs and symptoms of COVID-19 were discussed with the patient and how to seek care for testing (follow up with PCP or arrange E-visit).  The importance of social distancing was discussed today.  Time:   Today, I have spent 20 minutes with the patient with telehealth technology discussing the above problems.     Medication Adjustments/Labs and Tests Ordered: Current medicines are reviewed at length with the patient today.  Concerns regarding medicines are outlined above.   Tests Ordered: No orders of the defined types were placed in this encounter.   Medication Changes: No orders of the defined types were placed in this encounter.   Follow Up:  Either In Person or Virtual in 1 year(s)  Signed, Carlyle Dolly, MD  01/07/2020 8:57 AM    [facility]

## 2020-01-14 ENCOUNTER — Other Ambulatory Visit: Payer: Self-pay | Admitting: Family Medicine

## 2020-01-22 ENCOUNTER — Other Ambulatory Visit: Payer: Self-pay | Admitting: Family Medicine

## 2020-01-22 DIAGNOSIS — E1159 Type 2 diabetes mellitus with other circulatory complications: Secondary | ICD-10-CM

## 2020-02-03 ENCOUNTER — Other Ambulatory Visit: Payer: Self-pay | Admitting: Family Medicine

## 2020-02-03 DIAGNOSIS — E1159 Type 2 diabetes mellitus with other circulatory complications: Secondary | ICD-10-CM

## 2020-02-13 ENCOUNTER — Other Ambulatory Visit: Payer: Self-pay | Admitting: Family Medicine

## 2020-02-19 ENCOUNTER — Other Ambulatory Visit: Payer: Self-pay | Admitting: Family Medicine

## 2020-02-27 ENCOUNTER — Other Ambulatory Visit: Payer: Self-pay | Admitting: Family Medicine

## 2020-02-27 DIAGNOSIS — E1159 Type 2 diabetes mellitus with other circulatory complications: Secondary | ICD-10-CM

## 2020-03-11 ENCOUNTER — Ambulatory Visit: Payer: Medicare Other | Admitting: Family Medicine

## 2020-03-13 ENCOUNTER — Other Ambulatory Visit: Payer: Self-pay | Admitting: Family Medicine

## 2020-03-16 ENCOUNTER — Other Ambulatory Visit: Payer: Self-pay | Admitting: Family Medicine

## 2020-03-17 NOTE — Telephone Encounter (Signed)
Appointment scheduled.

## 2020-03-17 NOTE — Telephone Encounter (Signed)
[place]. NTBS 30 days given 02/19/20

## 2020-03-18 ENCOUNTER — Other Ambulatory Visit: Payer: Self-pay | Admitting: Family Medicine

## 2020-03-18 ENCOUNTER — Other Ambulatory Visit: Payer: Medicare Other

## 2020-03-18 DIAGNOSIS — E1159 Type 2 diabetes mellitus with other circulatory complications: Secondary | ICD-10-CM

## 2020-03-18 DIAGNOSIS — Z7902 Long term (current) use of antithrombotics/antiplatelets: Secondary | ICD-10-CM

## 2020-03-18 LAB — CMP14+EGFR
ALT: 17 IU/L (ref 0–32)
AST: 19 IU/L (ref 0–40)
Albumin/Globulin Ratio: 1.2 (ref 1.2–2.2)
Albumin: 3.2 g/dL — ABNORMAL LOW (ref 3.8–4.8)
Alkaline Phosphatase: 79 IU/L (ref 39–117)
BUN/Creatinine Ratio: 17 (ref 12–28)
BUN: 15 mg/dL (ref 8–27)
Bilirubin Total: 0.3 mg/dL (ref 0.0–1.2)
CO2: 23 mmol/L (ref 20–29)
Calcium: 10.2 mg/dL (ref 8.7–10.3)
Chloride: 105 mmol/L (ref 96–106)
Creatinine, Ser: 0.9 mg/dL (ref 0.57–1.00)
GFR calc Af Amer: 77 mL/min/{1.73_m2} (ref >59)
GFR calc non Af Amer: 66 mL/min/{1.73_m2} (ref >59)
Globulin, Total: 2.7 g/dL (ref 1.5–4.5)
Glucose: 114 mg/dL — ABNORMAL HIGH (ref 65–99)
Potassium: 4.6 mmol/L (ref 3.5–5.2)
Sodium: 142 mmol/L (ref 134–144)
Total Protein: 5.9 g/dL — ABNORMAL LOW (ref 6.0–8.5)

## 2020-03-18 LAB — CBC
Hematocrit: 37.7 % (ref 34.0–46.6)
Hemoglobin: 12.4 g/dL (ref 11.1–15.9)
MCH: 29 pg (ref 26.6–33.0)
MCHC: 32.9 g/dL (ref 31.5–35.7)
MCV: 88 fL (ref 79–97)
Platelets: 279 10*3/uL (ref 150–450)
RBC: 4.27 x10E6/uL (ref 3.77–5.28)
RDW: 13.5 % (ref 11.7–15.4)
WBC: 5.5 10*3/uL (ref 3.4–10.8)

## 2020-03-18 LAB — BAYER DCA HB A1C WAIVED: HB A1C (BAYER DCA - WAIVED): 7.6 % — ABNORMAL HIGH (ref <7.0)

## 2020-03-19 ENCOUNTER — Other Ambulatory Visit: Payer: Self-pay | Admitting: Cardiology

## 2020-03-21 ENCOUNTER — Other Ambulatory Visit: Payer: Self-pay

## 2020-03-21 ENCOUNTER — Ambulatory Visit (INDEPENDENT_AMBULATORY_CARE_PROVIDER_SITE_OTHER): Payer: Medicare Other | Admitting: Family Medicine

## 2020-03-21 DIAGNOSIS — E1159 Type 2 diabetes mellitus with other circulatory complications: Secondary | ICD-10-CM

## 2020-03-21 DIAGNOSIS — K068 Other specified disorders of gingiva and edentulous alveolar ridge: Secondary | ICD-10-CM

## 2020-03-21 MED ORDER — MAGIC MOUTHWASH W/LIDOCAINE: ORAL | 0 refills | Status: DC

## 2020-03-21 MED ORDER — METFORMIN HCL 1000 MG PO TABS: 1000.0000 mg | ORAL_TABLET | Freq: Two times a day (BID) | ORAL | 3 refills | Status: DC

## 2020-03-21 MED ORDER — NOVOLOG FLEXPEN 100 UNIT/ML ~~LOC~~ SOPN: PEN_INJECTOR | SUBCUTANEOUS | 11 refills | Status: DC

## 2020-03-21 MED ORDER — GABAPENTIN 400 MG PO CAPS: 400.0000 mg | ORAL_CAPSULE | Freq: Three times a day (TID) | ORAL | 3 refills | Status: DC

## 2020-03-21 MED ORDER — DULOXETINE HCL 30 MG PO CPEP: ORAL_CAPSULE | ORAL | 1 refills | Status: DC

## 2020-03-21 MED ORDER — FOLIC ACID 1 MG PO TABS: 1.0000 mg | ORAL_TABLET | Freq: Every day | ORAL | 0 refills | Status: DC

## 2020-03-21 MED ORDER — LISINOPRIL 20 MG PO TABS: 20.0000 mg | ORAL_TABLET | Freq: Every day | ORAL | 3 refills | Status: DC

## 2020-03-21 MED ORDER — TRESIBA FLEXTOUCH 100 UNIT/ML ~~LOC~~ SOPN: 18.0000 [IU] | PEN_INJECTOR | SUBCUTANEOUS | 1 refills | Status: DC

## 2020-03-21 NOTE — Progress Notes (Signed)
Telephone visit  Subjective: CC: check up PCP: Janora Norlander, DO XTG:GYIRSWNIO [name] is a 69 y.o. female calls for telephone consult today. Patient provides verbal consent for consult held via phone.  Due to COVID-19 pandemic this visit was conducted virtually. This visit type was conducted due to national recommendations for restrictions regarding the COVID-19 Pandemic (e.g. social distancing, sheltering in place) in an effort to limit this patient's exposure and mitigate transmission in our community. All issues noted in this document were discussed and addressed.  A physical exam was not performed with this format.   Location of patient: home Location of provider: Working remotely from home Others present for call: none  1. 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  Overall she has been feeling okay.  Her gums have been hurting her quite a bit.  She saw her specialist recently and was told that she had gingivitis.  She was placed on oral antibiotic.  She notes despite use of Aleve, Motrin and to her oxycodone, her gums are still quite tender and inflamed.  Last eye exam: Needs; scheduled 5/25 Last foot exam: Needs Last A1c:  Lab Results  Component Value Date   HGBA1C 7.6 (H) 03/18/2020   Nephropathy screen indicated?: UTD Last flu, zoster and/or pneumovax:  Immunization History  Administered Date(s) Administered  . Fluad Quad(high Dose 65+) 08/10/2019  . Influenza,inj,Quad PF,6+ Mos 08/04/2018  . Pneumococcal Polysaccharide-23 08/10/2019    ROS: no CP,SOB, falls.  She does report neuropathy and some balance issues that are stable.  ROS: Per HPI  No Known Allergies Past Medical History:  Diagnosis Date  . Anxiety    no rx  . Arthritis   . Cancer [facility])    Left mastectomy, 2/11  . Carotid disease, bilateral ([place])    Followed by Dr. Leonie Man  . Carotid disease, bilateral ([place]) 10/21/2011  . Coronary artery disease     cabg 2007  . Diabetes mellitus   . Essential hypertension, benign 07/31/2009   Qualifier: Diagnosis of  By: Mare Ferrari, RMA, Sherri    . HLD (hyperlipidemia)   . Hypertension   . Neuromuscular disorder ([place])   . Neuropathic pain, leg   . Stroke [facility])    more than 10 yrs weakness rt leg  . Stroke, lacunar ([place])    Followed by Dr. Leonie Man  . T2DM (type 2 diabetes mellitus) ([place]) 03/23/2018    Current Outpatient Medications:  .  aspirin 81 MG chewable tablet, Chew 81 mg by mouth daily.  , Disp: , Rfl:  .  blood glucose meter kit and supplies KIT, Dispense based on patient and insurance preference. Use up to four times daily as directed. (DxE11.9/E11.59), Disp: 1 each, Rfl: 0 .  Cholecalciferol (VITAMIN D) 2000 units CAPS, Take 1 capsule (2,000 Units total) by mouth daily., Disp: 90 capsule, Rfl: 1 .  clopidogrel (PLAVIX) 75 MG tablet, Take 1 tablet (75 mg total) by mouth daily., Disp: 90 tablet, Rfl: 2 .  DULoxetine (CYMBALTA) 30 MG capsule, TAKE 1 CAPSULE BY MOUTH EVERY DAY, Disp: 90 capsule, Rfl: 0 .  folic acid (FOLVITE) 1 MG tablet, TAKE 1 TABLET BY MOUTH EVERY DAY, Disp: 90 tablet, Rfl: 0 .  gabapentin (NEURONTIN) 400 MG capsule, Take 1 capsule (400 mg total) by mouth 3 (three) times daily. Needs to be seen for further refills., Disp: 90 capsule, Rfl: 0 .  glucose blood test strip, Test blood sugar up to 4  times daily. Dx E11.9/ E11.59.  Meter/ strips per insurance/patient preference., Disp: 300 each, Rfl: 4 .  hydrochlorothiazide (MICROZIDE) 12.5 MG capsule, Take 12.5 mg by mouth daily. , Disp: , Rfl:  .  insulin aspart (NOVOLOG FLEXPEN) 100 UNIT/ML FlexPen, Inject 4 units once daily with largest meal of the day., Disp: 15 mL, Rfl: 11 .  insulin degludec (TRESIBA FLEXTOUCH) 100 UNIT/ML FlexTouch Pen, Inject 0.18 mLs (18 Units total) into the skin every morning. NEEDS OV, Disp: 15 mL, Rfl: 1 .  Lancets MISC, Use to test blood sugar up to 4 times daily (E11.9/E11.59), Disp: 300 each, Rfl: 4 .   linaclotide (LINZESS) 145 MCG CAPS capsule, Take 145 mcg by mouth daily before breakfast. , Disp: , Rfl:  .  lisinopril (PRINIVIL,ZESTRIL) 20 MG tablet, Take 1 tablet (20 mg total) by mouth daily., Disp: 90 tablet, Rfl: 3 .  metFORMIN (GLUCOPHAGE) 1000 MG tablet, Take 1 tablet (1,000 mg total) by mouth 2 (two) times daily., Disp: 60 tablet, Rfl: 0 .  metoprolol tartrate (LOPRESSOR) 25 MG tablet, TAKE 1/2 TABLET (12.5 MG TOTAL) BY MOUTH 2 (TWO) TIMES DAILY., Disp: 90 tablet, Rfl: 1 .  naproxen sodium (ALEVE) 220 MG tablet, Take 220 mg by mouth as needed., Disp: , Rfl:  .  oxyCODONE (ROXICODONE) 15 MG immediate release tablet, Take 1 tablet by mouth 4 (four) times daily as needed., Disp: , Rfl:  .  pravastatin (PRAVACHOL) 40 MG tablet, TAKE 1 TABLET BY MOUTH EVERY DAY, Disp: 90 tablet, Rfl: 0  Assessment/ Plan: 69 y.o. female   1. Pain in gums Trial of Magic mouthwash with lidocaine to see if we can perhaps give her some relief. - magic mouthwash w/lidocaine SOLN; Gargle and spit 49m every 6 hours as needed for sore throat  Dispense: 480 mL; Refill: 0  2. DM type 2 causing vascular disease ([place] Med[place] have been ref[place].  Sugar has been slightly elevated but I think that this is likely related to infection.  We will see her back in 3 months for recheck, sooner if needed - metFORMIN (GLUCOPHAGE) 1000 MG tablet; Take 1 tablet (1,000 mg total) by mouth 2 (two) times daily.  Dispense: 60 tablet; Refill: 3 - insulin degludec (TRESIBA FLEXTOUCH) 100 UNIT/ML FlexTouch Pen; Inject 0.18 mLs (18 Units total) into the skin every morning.  Dispense: 15 mL; Refill: 1 - insulin aspart (NOVOLOG FLEXPEN) 100 UNIT/ML FlexPen; Inject 4 units once daily with largest meal of the day.  Dispense: 15 mL; Refill: 11   Start time: 2:49pm End time: 2:57pm  Total time spent on patient care (including telephone call/ virtual visit): 15 minutes  [place]([phone]

## 2020-03-22 LAB — SPECIMEN STATUS REPORT

## 2020-03-22 LAB — VITAMIN B12: Vitamin B-12: 89 pg/mL — ABNORMAL LOW (ref 232–1245)

## 2020-04-02 ENCOUNTER — Other Ambulatory Visit: Payer: Self-pay | Admitting: Family Medicine

## 2020-04-02 DIAGNOSIS — E538 Deficiency of other specified B group vitamins: Secondary | ICD-10-CM

## 2020-04-02 MED ORDER — "SYRINGE 25G X 1"" 3 ML MISC": 0 refills | Status: DC

## 2020-04-02 MED ORDER — CYANOCOBALAMIN 1000 MCG/ML IJ SOLN: INTRAMUSCULAR | 1 refills | Status: DC

## 2020-04-15 ENCOUNTER — Ambulatory Visit (INDEPENDENT_AMBULATORY_CARE_PROVIDER_SITE_OTHER): Payer: Medicare Other | Admitting: Family Medicine

## 2020-04-15 ENCOUNTER — Other Ambulatory Visit: Payer: Self-pay

## 2020-04-15 VITALS — BP 117/63 | HR 58 | Temp 97.6°F | Wt 143.0 lb

## 2020-04-15 DIAGNOSIS — I10 Essential (primary) hypertension: Secondary | ICD-10-CM | POA: Diagnosis not present

## 2020-04-15 DIAGNOSIS — E785 Hyperlipidemia, unspecified: Secondary | ICD-10-CM | POA: Diagnosis not present

## 2020-04-15 DIAGNOSIS — E1169 Type 2 diabetes mellitus with other specified complication: Secondary | ICD-10-CM | POA: Diagnosis not present

## 2020-04-15 DIAGNOSIS — E1159 Type 2 diabetes mellitus with other circulatory complications: Secondary | ICD-10-CM

## 2020-04-15 MED ORDER — TRESIBA FLEXTOUCH 100 UNIT/ML ~~LOC~~ SOPN: 19.0000 [IU] | PEN_INJECTOR | SUBCUTANEOUS | 1 refills | Status: AC

## 2020-04-15 NOTE — Patient Instructions (Signed)
Increase your tresiba to 19units tomorrow.  If your FASTING blood sugar is above 150 for 2 consecutive days, increase by 1 unit of your long acting insulin.  Example: Day 1: Blood sugar was 159 Day 2: Blood sugar was 205  Increase Lantus to 22 units Day 3: Blood sugar is 202 Day 4: Blood sugars 168  Increase Lantus to 23 units Day 5: Blood sugar is 105 Day 6: Blood sugars 145  Stick with 23 units.  Do not increase.

## 2020-04-15 NOTE — Progress Notes (Signed)
Subjective: CC: Follow-up diabetes, hypertension, hyperlipidemia PCP: Janora Norlander, DO RPR:XYVOPFYTW [name] is a 69 y.o. female presenting to clinic today for:  1. Type 2 Diabetes with hypertension, hyperlipidemia:  She reports fasting blood sugars are running around 150-200s.  Taking medication(s): Metformin 1000 mg twice daily, NovoLog 4 units with meals, Tresiba 18units every morning, metoprolol twice daily, lisinopril 20 mg daily, Pravachol 40 mg daily Her blood sugars are typically running in the 200s in the morning and 170s to 190s in the evening time.  Last eye exam: UTD Last foot exam: needs Last A1c:  Lab Results  Component Value Date   HGBA1C 7.6 (H) 03/18/2020   Nephropathy screen indicated?: on ACE-I Last flu, zoster and/or pneumovax: UTD Immunization History  Administered Date(s) Administered  . Fluad Quad(high Dose 65+) 08/10/2019  . Influenza,inj,Quad PF,6+ Mos 08/04/2018  . Moderna SARS-COVID-2 Vaccination 12/10/2019, 01/11/2020  . Pneumococcal Polysaccharide-23 08/10/2019  . Tdap 02/26/2010, 02/19/2018    ROS: No chest pain, shortness of breath, falls.     No Known Allergies Past Medical History:  Diagnosis Date  . Anxiety    no rx  . Arthritis   . Cancer [facility])    Left mastectomy, 2/11  . Carotid disease, bilateral ([place])    Followed by Dr. Leonie Man  . Carotid disease, bilateral ([place]) 10/21/2011  . Coronary artery disease    cabg 2007  . Diabetes mellitus   . Essential hypertension, benign 07/31/2009   Qualifier: Diagnosis of  By: Mare Ferrari, RMA, Sherri    . HLD (hyperlipidemia)   . Hypertension   . Neuromuscular disorder ([place])   . Neuropathic pain, leg   . Stroke [facility])    more than 10 yrs weakness rt leg  . Stroke, lacunar ([place])    Followed by Dr. Leonie Man  . T2DM (type 2 diabetes mellitus) ([place]) 03/23/2018    Current Outpatient Medications:  .  blood glucose meter kit and supplies KIT, Dispense based on patient and insurance preference. Use  up to four times daily as directed. (DxE11.9/E11.59), Disp: 1 each, Rfl: 0 .  Cholecalciferol (VITAMIN D) 2000 units CAPS, Take 1 capsule (2,000 Units total) by mouth daily., Disp: 90 capsule, Rfl: 1 .  cyanocobalamin (,VITAMIN B-12,) 1000 MCG/ML injection, Inject 1 mL intramuscularly every week for 4 weeks.  Then inject 1 mL intramuscularly every 28 days., Disp: 10 mL, Rfl: 1 .  DULoxetine (CYMBALTA) 30 MG capsule, TAKE 1 CAPSULE BY MOUTH EVERY DAY, Disp: 90 capsule, Rfl: 1 .  folic acid (FOLVITE) 1 MG tablet, Take 1 tablet (1 mg total) by mouth daily., Disp: 90 tablet, Rfl: 0 .  gabapentin (NEURONTIN) 400 MG capsule, Take 1 capsule (400 mg total) by mouth 3 (three) times daily., Disp: 90 capsule, Rfl: 3 .  glucose blood test strip, Test blood sugar up to 4 times daily. Dx E11.9/ E11.59.  Meter/ strips per insurance/patient preference., Disp: 300 each, Rfl: 4 .  hydrochlorothiazide (MICROZIDE) 12.5 MG capsule, Take 12.5 mg by mouth daily. , Disp: , Rfl:  .  insulin aspart (NOVOLOG FLEXPEN) 100 UNIT/ML FlexPen, Inject 4 units once daily with largest meal of the day., Disp: 15 mL, Rfl: 11 .  insulin degludec (TRESIBA FLEXTOUCH) 100 UNIT/ML FlexTouch Pen, Inject 0.18 mLs (18 Units total) into the skin every morning., Disp: 15 mL, Rfl: 1 .  Lancets MISC, Use to test blood sugar up to 4 times daily (E11.9/E11.59), Disp: 300 each, Rfl: 4 .  lisinopril (ZESTRIL) 20 MG tablet, Take  1 tablet (20 mg total) by mouth daily., Disp: 90 tablet, Rfl: 3 .  magic mouthwash w/lidocaine SOLN, Gargle and spit 66m every 6 hours as needed for sore throat, Disp: 480 mL, Rfl: 0 .  metFORMIN (GLUCOPHAGE) 1000 MG tablet, Take 1 tablet (1,000 mg total) by mouth 2 (two) times daily., Disp: 60 tablet, Rfl: 3 .  naproxen sodium (ALEVE) 220 MG tablet, Take 220 mg by mouth as needed., Disp: , Rfl:  .  oxyCODONE (ROXICODONE) 15 MG immediate release tablet, Take 1 tablet by mouth 4 (four) times daily as needed., Disp: , Rfl:  .   Syringe/Needle, Disp, (SYRINGE 3CC/25GX1") 25G X 1" 3 ML MISC, Use to inject B12 intramuscularly as directed, Disp: 15 each, Rfl: 0 .  aspirin 81 MG chewable tablet, Chew 81 mg by mouth daily.  , Disp: , Rfl:  .  clopidogrel (PLAVIX) 75 MG tablet, Take 1 tablet (75 mg total) by mouth daily. (Patient not taking: Reported on 04/15/2020), Disp: 90 tablet, Rfl: 2 .  metoprolol tartrate (LOPRESSOR) 25 MG tablet, TAKE 1/2 TABLET (12.5 MG TOTAL) BY MOUTH 2 (TWO) TIMES DAILY., Disp: 90 tablet, Rfl: 1 Social History   Socioeconomic History  . Marital status: Widowed    Spouse name: Not on file  . Number of children: Not on file  . Years of education: Not on file  . Highest education level: Not on file  Occupational History  . Not on file  Tobacco Use  . Smoking status: Never Smoker  . Smokeless tobacco: Never Used  Substance and Sexual Activity  . Alcohol use: No    Alcohol/week: 0.0 standard drinks  . Drug use: No  . Sexual activity: Never    Birth control/protection: Post-menopausal  Other Topics Concern  . Not on file  Social History Narrative   ** Merged History Encounter **       Social Determinants of Health   Financial Resource Strain:   . Difficulty of Paying Living Expenses:   Food Insecurity:   . Worried About RCharity fundraiserin the Last Year:   . RArboriculturistin the Last Year:   Transportation Needs:   . LFilm/video editor(Medical):   .[place] KitchenLack of Transportation (Non-Medical):   Physical Activity:   . Days of Exercise per Week:   . Minutes of Exercise per Session:   Stress:   . Feeling of Stress :   Social Connections:   . Frequency of Communication with Friends and Family:   . Frequency of Social Gatherings with Friends and Family:   . Attends Religious Services:   . Active Member of Clubs or Organizations:   . Attends CArchivistMeetings:   .Marland KitchenMarital Status:   Intimate Partner Violence:   . Fear of Current or Ex-Partner:   . Emotionally  Abused:   .Marland KitchenPhysically Abused:   . Sexually Abused:    Family History  Problem Relation Age of Onset  . Cancer Mother        breast   . Stroke Mother   . Diabetes Mother   . Hypertension Mother   . Heart attack Father   . Diabetes Sister   . Lung cancer Brother   . Heart disease Brother     Objective: Office vital signs reviewed. BP 117/63   Pulse (!) 58   Temp 97.6 F (36.4 C)   Wt 143 lb (64.9 kg)   SpO2 97%   BMI 24.55 kg/m  Physical Examination:  General: Awake, alert, well nourished, No acute distress HEENT: Normal, sclera white, MMM Cardio: Slightly bradycardic with regular rhythm, S1S2 heard, no murmurs appreciated Pulm: clear to auscultation bilaterally, no wheezes, rhonchi or rales; normal work of breathing on room air Extremities: warm, well perfused, No edema, cyanosis or clubbing; +2 pulses bilaterally MSK: Uses cane for ambulation Neuro: see Dm foot Diabetic Foot Exam - Simple   Simple Foot Form Diabetic Foot exam was performed with the following findings: Yes 04/15/2020  4:23 PM  Visual Inspection No deformities, no ulcerations, no other skin breakdown bilaterally: Yes Sensation Testing See comments: Yes Pulse Check Posterior Tibialis and Dorsalis pulse intact bilaterally: Yes Comments Both great toenails are missing.  She has +2 pedal pulses bilaterally.  Decreased monofilament sensation left foot and totally absent in the right foot.      Assessment/ Plan: 69 y.o. female   1. DM type 2 causing vascular disease ([place]) Blood sugar is not at goal.  Her A1c is 7.4 today.  We discussed increasing her long-acting insulin to 19 units and then increasing by 1 unit every 2 days for fasting blood sugars above 150.  Her diabetic foot exam was completed today. - insulin degludec (TRESIBA FLEXTOUCH) 100 UNIT/ML FlexTouch Pen; Inject 0.19-0.24 mLs (19-24 Units total) into the skin every morning.  Dispense: 30 mL; Refill: 1  2. Hypertension associated with  diabetes ([place]) Controlled continue current regimen  3. Hyperlipidemia associated with type 2 diabetes mellitus ([place]) Continue statin  Follow-up in 3 months    No orders of the defined types were placed in this encounter.  No orders of the defined types were placed in this encounter.    Janora Norlander, DO [place] [phone]

## 2020-04-16 ENCOUNTER — Encounter: Payer: Self-pay | Admitting: Family Medicine

## 2020-05-11 ENCOUNTER — Other Ambulatory Visit: Payer: Self-pay | Admitting: Family Medicine

## 2020-05-11 DIAGNOSIS — E538 Deficiency of other specified B group vitamins: Secondary | ICD-10-CM

## 2020-05-21 ENCOUNTER — Ambulatory Visit (INDEPENDENT_AMBULATORY_CARE_PROVIDER_SITE_OTHER): Payer: Medicare Other | Admitting: *Deleted

## 2020-05-21 DIAGNOSIS — Z0000 Encounter for general adult medical examination without abnormal findings: Secondary | ICD-10-CM | POA: Diagnosis not present

## 2020-05-21 NOTE — Progress Notes (Signed)
Marland KitchenMarland KitchenMarland Kitchen.........Marland Kitchen     MEDICARE ANNUAL WELLNESS VISIT  05/21/2020  Telephone Visit Disclaimer This Medicare AWV was conducted by telephone due to national recommendations for restrictions regarding the COVID-19 Pandemic (e.g. social distancing).  I verified, using two identifiers, that I am speaking with [name] or their authorized healthcare agent. I discussed the limitations, risks, security, and privacy concerns of performing an evaluation and management service by telephone and the potential availability of an in-person appointment in the future. The patient expressed understanding and agreed to proceed.   Subjective:  [name] is a 69 y.o. female patient of Janora Norlander, DO who had a Medicare Annual Wellness Visit today via telephone. [name] is Retired and lives with their daughter. she has 5 children. she reports that she is socially active and does interact with friends/family regularly. she is not physically active and enjoys reading.  Patient Care Team: Janora Norlander, DO as PCP - General (Family Medicine) Harl Bowie Alphonse Guild, MD as PCP - Cardiology (Cardiology) Timmothy Euler, MD (Inactive) (Family Medicine)  Advanced Directives 05/21/2020 10/31/2017 09/26/2017 05/21/2017 03/26/2016 10/12/2015 09/30/2011  Does Patient Have a Medical Advance Directive? No No No No No No Patient does not have advance directive  Would patient like information on creating a medical advance directive? No - Patient declined No - Patient declined No - Patient declined No - Patient declined No - patient declined information No - patient declined information -  Some encounter information is confidential and restricted. Go to Review Flowsheets activity to see all data.    Hospital Utilization Over the Past 12 Months: # of hospitalizations or ER visits: 0 # of surgeries: 0  Review of Systems    Patient reports that her overall health is unchanged compared to last  year.  History obtained from chart review and the patient  Patient Reported Readings (BP, Pulse, CBG, Weight, etc) none  Pain Assessment Pain : No/denies pain     Current Medications & Allergies (verified) Allergies as of 05/21/2020   No Known Allergies     Medication List       Accurate as of May 21, 2020  3:06 PM. If you have any questions, ask your nurse or doctor.        aspirin 81 MG chewable tablet Chew 81 mg by mouth daily.   B-D 3CC LUER-LOK SYR 25GX1" 25G X 1" 3 ML Misc Generic drug: SYRINGE-NEEDLE (DISP) 3 ML USE TO INJECT B12 INTRAMUSCULARLY AS DIRECTED   blood glucose meter kit and supplies Kit Dispense based on patient and insurance preference. Use up to four times daily as directed. (DxE11.9/E11.59)   clopidogrel 75 MG tablet Commonly known as: PLAVIX Take 1 tablet (75 mg total) by mouth daily.   cyanocobalamin 1000 MCG/ML injection Commonly known as: (VITAMIN B-12) Inject 1 mL intramuscularly every week for 4 weeks.  Then inject 1 mL intramuscularly every 28 days.   DULoxetine 30 MG capsule Commonly known as: CYMBALTA TAKE 1 CAPSULE BY MOUTH EVERY DAY   folic acid 1 MG tablet Commonly known as: FOLVITE Take 1 tablet (1 mg total) by mouth daily.   gabapentin 400 MG capsule Commonly known as: NEURONTIN Take 1 capsule (400 mg total) by mouth 3 (three) times daily.   glucose blood test strip Test blood sugar up to 4 times daily. Dx E11.9/ E11.59.  Meter/ strips per insurance/patient preference.   hydrochlorothiazide 12.5 MG capsule Commonly known as: MICROZIDE Take 12.5 mg by mouth daily.  Lancets Misc Use to test blood sugar up to 4 times daily (E11.9/E11.59)   lisinopril 20 MG tablet Commonly known as: ZESTRIL Take 1 tablet (20 mg total) by mouth daily.   magic mouthwash w/lidocaine Soln Gargle and spit 40m every 6 hours as needed for sore throat   metFORMIN 1000 MG tablet Commonly known as: GLUCOPHAGE Take 1 tablet (1,000 mg  total) by mouth 2 (two) times daily.   metoprolol tartrate 25 MG tablet Commonly known as: LOPRESSOR TAKE 1/2 TABLET (12.5 MG TOTAL) BY MOUTH 2 (TWO) TIMES DAILY.   naproxen sodium 220 MG tablet Commonly known as: ALEVE Take 220 mg by mouth as needed.   NovoLOG FlexPen 100 UNIT/ML FlexPen Generic drug: insulin aspart Inject 4 units once daily with largest meal of the day.   oxyCODONE 15 MG immediate release tablet Commonly known as: ROXICODONE Take 1 tablet by mouth 4 (four) times daily as needed.   TTyler AasFlexTouch 100 UNIT/ML FlexTouch Pen Generic drug: insulin degludec Inject 0.19-0.24 mLs (19-24 Units total) into the skin every morning.   Vitamin D 50 MCG (2000 UT) Caps Take 1 capsule (2,000 Units total) by mouth daily.       History (reviewed): Past Medical History:  Diagnosis Date  . Anxiety    no rx  . Arthritis   . Cancer ([facility]    Left mastectomy, 2/11  . 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 SLeonie Man . [place] 10/[place]  . Coronary artery disease    cabg 2007  . Diabetes mellitus   . Essential hypertension, benign 07/31/2009   Qualifier: Diagnosis of  By: FMare Ferrari RMA, Sherri    . HLD (hyperlipidemia)   . Hypertension   . Neuromuscular disorder ([facility]   . Neuropathic pain, leg   . Stroke ([facility]    more than 10 yrs weakness rt leg  . [place]ar ([facility]    Followed by Dr. SLeonie Man . T2DM (type 2 diabetes mellitus) ([facility] 03/23/2018   Past Surgical History:  Procedure Laterality Date  . arm fx Left 11  . CATARACT EXTRACTION W/PHACO Right 09/26/2017   Procedure: CATARACT EXTRACTION PHACO AND INTRAOCULAR LENS PLACEMENT (IOC);  Surgeon: HTonny Branch MD;  Location: [facility];  Service: Ophthalmology;  Laterality: Right;  CDE: 8.52  . CATARACT EXTRACTION W/PHACO Left 10/31/2017   Procedure: CATARACT EXTRACTION PHACO AND INTRAOCULAR LENS PLACEMENT LEFT EYE CDE=7.75;  Surgeon: HTonny Branch MD;  Location: [facility];  Service:  Ophthalmology;  Laterality: Left;  left  . CORONARY ARTERY BYPASS GRAFT  07  . EYE SURGERY Right    cateracts  . FRACTURE SURGERY Bilateral    bilateral wrist and right knee orif's  . INSERTION OF MESH N/A 04/05/2016   Procedure: INSERTION OF MESH;  Surgeon: MRolm Bookbinder MD;  [place] [facility]  Service: General;  Laterality: N/A;  . MASTECTOMY  2010   lt-nodes  . ORIF ULNAR FRACTURE  10/01/2011   Procedure: OPEN REDUCTION INTERNAL FIXATION (ORIF) ULNAR FRACTURE;  Surgeon: FKerin Salen  [place]  Service: Orthopedics;  Laterality: Right;  and orif scaphoid  . UMBILICAL HERNIA REPAIR  04/05/2016   with 6.4 cm ventrallex mesh  . UMBILICAL HERNIA REPAIR N/A 04/05/2016   Procedure: UMBILICAL HERNIA REPAIR;  Surgeon: MRolm Bookbinder MD;  [facility]  Service: General;  Laterality: N/A;   Family History  Problem Relation Age of Onset  . Cancer Mother  breast   . Stroke Mother   . Diabetes Mother   . Hypertension Mother   . Heart attack Father   . Diabetes Sister   . Lung cancer Brother   . Heart disease Brother    Social History   Socioeconomic History  . Marital status: Widowed    Spouse name: Not on file  . Number of children: 5  . Years of education: 25  . Highest education level: Not on file  Occupational History  . Not on file  Tobacco Use  . Smoking status: Never Smoker  . Smokeless tobacco: Never Used  Vaping Use  . Vaping Use: Never used  Substance and Sexual Activity  . Alcohol use: No    Alcohol/week: 0.0 standard drinks  . Drug use: No  . Sexual activity: Not Currently    Birth control/protection: Post-menopausal  Other Topics Concern  . Not on file  Social History Narrative   ** Merged History Encounter **       Social Determinants of Health   Financial Resource Strain:   . Difficulty of Paying Living Expenses:   Food Insecurity:   . Worried About Charity fundraiser in the Last Year:   . Arboriculturist in the Last Year:    Transportation Needs:   . Film/video editor (Medical):   Marland Kitchen Lack of Transportation (Non-Medical):   Physical Activity:   . Days of Exercise per Week:   . Minutes of Exercise per Session:   Stress:   . Feeling of Stress :   Social Connections:   . Frequency of Communication with Friends and Family:   . Frequency of Social Gatherings with Friends and Family:   . Attends Religious Services:   . Active Member of Clubs or Organizations:   . Attends Archivist Meetings:   Marland Kitchen Marital Status:     Activities of Daily Living In your present state of health, do you have any difficulty performing the following activities: 05/21/2020  Hearing? Y  Comment difficult to hear tv  Vision? N  Difficulty concentrating or making decisions? Y  Walking or climbing stairs? N  Dressing or bathing? N  Doing errands, shopping? N  Preparing Food and eating ? N  Using the Toilet? N  In the past six months, have you accidently leaked urine? N  Do you have problems with loss of bowel control? N  Managing your Medications? N  Managing your Finances? Y  Comment daughter does  [place], broadcasting/film/video? N  Some recent data might be hidden    Patient Education/ Literacy How often do you need to have someone help you when you read instructions, pamphlets, or other written materials from your doctor or pharmacy?: 2 - Rarely What is the last grade level you completed in school?: 12  Exercise Current Exercise Habits: The patient does not participate in regular exercise at present, Exercise limited by: neurologic condition(s)  Diet Patient reports consuming 1 meals a day and 1 snack(s) a day Patient reports that her primary diet is: Regular Patient reports that she does have regular access to food.   Depression Screen PHQ 2/9 Scores 05/21/2020 04/15/2020 08/10/2019 04/30/2019 11/28/2018 08/21/2018 05/16/2018  PHQ - 2 Score 0 0 0 0 0 0 0  PHQ- 9 Score - 0 0 0 0 - -  Some encounter  information is confidential and restricted. Go to Review Flowsheets activity to see all data.     Fall Risk Fall  Risk  05/21/2020 04/15/2020 08/10/2019 04/30/2019 11/28/2018  Falls in the past year? 1 1 1  0 1  Number falls in past yr: 1 1 1  - 1  Injury with Fall? 0 0 1 - 0  Comment - - - - -  Risk Factor Category  - - - - -  Risk for fall due to : History of fall(s);Impaired balance/gait Impaired balance/gait;History of fall(s) - - -  Follow up - Falls prevention discussed - - -  Some encounter information is confidential and restricted. Go to Review Flowsheets activity to see all data.     Objective:  [name] seemed alert and oriented and she participated appropriately during our telephone visit.  Blood Pressure Weight BMI  BP Readings from Last 3 Encounters:  04/16/20 117/63  08/10/19 108/60  06/25/19 (!) 100/58   Wt Readings from Last 3 Encounters:  04/16/20 143 lb (64.9 kg)  01/07/20 147 lb (66.7 kg)  08/10/19 159 lb (72.1 kg)   BMI Readings from Last 1 Encounters:  04/16/20 24.55 kg/m    *Unable to obtain current vital signs, weight, and BMI due to telephone visit type  Hearing/Vision  . [name] did not seem to have difficulty with hearing/understanding during the telephone conversation . Reports that she has had a formal eye exam by an eye care professional within the past year . Reports that she has not had a formal hearing evaluation within the past year *Unable to fully assess hearing and vision during telephone visit type  Cognitive Function: 6CIT Screen 05/21/2020  What Year? 0 points  What month? 0 points  What time? 0 points  Count back from 20 0 points  Months in reverse 0 points  Repeat phrase 0 points  Total Score 0   (Normal:0-7, Significant for Dysfunction: >8)  Normal Cognitive Function Screening: Yes   Immunization & Health Maintenance Record Immunization History  Administered Date(s) Administered  . Fluad Quad(high Dose 65+) 08/10/2019   . Influenza,inj,Quad PF,6+ Mos 08/04/2018  . Moderna SARS-COVID-2 Vaccination 12/10/2019, 01/11/2020  . Pneumococcal Polysaccharide-23 08/10/2019  . Tdap 02/26/2010, 02/19/2018    Health Maintenance  Topic Date Due  . OPHTHALMOLOGY EXAM  08/24/2019  . MAMMOGRAM  02/29/2020  . INFLUENZA VACCINE  06/15/2020  . PNA vac Low Risk Adult (2 of 2 - PCV13) 08/09/2020  . HEMOGLOBIN A1C  09/18/2020  . FOOT EXAM  04/15/2021  . COLONOSCOPY  02/20/2023  . TETANUS/TDAP  02/20/2028  . DEXA SCAN  Completed  . COVID-19 Vaccine  Completed  . Hepatitis C Screening  Completed       Assessment  This is a routine wellness examination for [name].  Health Maintenance: Due or Overdue Health Maintenance Due  Topic Date Due  . OPHTHALMOLOGY EXAM  08/24/2019  . MAMMOGRAM  02/29/2020    [name] does not need a referral for Community Assistance: Care Management:   no Social Work:    no Prescription Assistance:  no Nutrition/Diabetes Education:  no   Plan:  Personalized Goals Goals Addressed            This Visit's Progress   . DIET - INCREASE WATER INTAKE      . Have 3 meals a day       Pt is only eating one meal per day, she will try to eat 3 a day even if small meals      Personalized Health Maintenance & Screening Recommendations  up to date  Lung Cancer  Screening Recommended: no (Low Dose CT Chest recommended if Age 29-80 years, 30 pack-year currently smoking OR have quit w/in past 15 years) Hepatitis C Screening recommended: no HIV Screening recommended: no  Advanced Directives: Written information was not prepared per patient's request.  Referrals & Orders No orders of the defined types were placed in this encounter.   Follow-up Plan . Follow-up with Janora Norlander, DO as planned on 07/16/20. . Pt says she had a mammogram in the last 3 months at the [facility] in [place]. Records request sent. . Pt states that she just had a vision exam at [facility], records request sent. . Health maintenance up to date. . Pt remains independent with ADL's, daughter only helps with paying bills. . Pt declined information on advanced directives. . Pt voices no healthcare concerns or has any questions for this nurse today. . AVS printed and mailed to pt    I have personally reviewed and noted the following in the patient's chart:   . Medical and social history . Use of alcohol, tobacco or illicit drugs  . Current medications and supplements . Functional ability and status . Nutritional status . Physical activity . Advanced directives . List of other physicians . Hospitalizations, surgeries, and ER visits in previous 12 months . Vitals . Screenings to include cognitive, depression, and falls . Referrals and appointments  In addition, I have reviewed and discussed with [name] certain preventive protocols, quality metrics, and best practice recommendations. A written personalized care plan for preventive services as well as general preventive health recommendations is available and can be mailed to the patient at her request.      Rana Snare, LPN  04/20/599

## 2020-06-11 ENCOUNTER — Other Ambulatory Visit: Payer: Self-pay | Admitting: Family Medicine

## 2020-06-12 ENCOUNTER — Other Ambulatory Visit: Payer: Self-pay | Admitting: Family Medicine

## 2020-06-12 DIAGNOSIS — E1159 Type 2 diabetes mellitus with other circulatory complications: Secondary | ICD-10-CM

## 2020-06-17 ENCOUNTER — Telehealth: Payer: Self-pay | Admitting: Family Medicine

## 2020-06-17 NOTE — Telephone Encounter (Signed)
Cut the Lisinopril in half (only 10mg  daily).  See if we can get her on my schedule for recheck next week.  If continues to feel poorly, please have her seen ASAP

## 2020-06-17 NOTE — Telephone Encounter (Signed)
Physical therapy called to make you aware of patient's blood pressure.  She has been coming to them for her therapy and today her b/p sitting -103/70 and standing 82/65 (taken twice).  They are having her daughter come and pick her up from therapy, but wanted to let you know.  She has not been feeling well.

## 2020-06-18 NOTE — Telephone Encounter (Signed)
Lmtcb.

## 2020-06-20 LAB — HM DIABETES EYE EXAM

## 2020-07-01 ENCOUNTER — Other Ambulatory Visit: Payer: Self-pay | Admitting: Family Medicine

## 2020-07-15 ENCOUNTER — Other Ambulatory Visit: Payer: Self-pay | Admitting: Family Medicine

## 2020-07-15 DIAGNOSIS — E1159 Type 2 diabetes mellitus with other circulatory complications: Secondary | ICD-10-CM

## 2020-07-16 ENCOUNTER — Ambulatory Visit: Payer: Medicare Other | Admitting: Family Medicine

## 2020-07-16 NOTE — Progress Notes (Deleted)
Subjective: CC: Follow-up diabetes, hypertension, hyperlipidemia PCP: Janora Norlander, DO TDH:RCBULAGTX AVAREE GILBERTI is a 69 y.o. female presenting to clinic today for:  1. Type 2 Diabetes with hypertension, hyperlipidemia:  She reports fasting blood sugars are ***s.  Taking medication(s): Metformin 1000 mg twice daily, NovoLog 4 units with meals, Tyler Aas *** every morning, metoprolol twice daily, lisinopril 20 mg daily, Pravachol 40 mg daily  Last eye exam: needs  Last foot exam: UTD Last A1c:  Lab Results  Component Value Date   HGBA1C 7.6 (H) 03/18/2020   Nephropathy screen indicated?: on ACE-I Last flu, zoster and/or pneumovax: UTD Immunization History  Administered Date(s) Administered  . Fluad Quad(high Dose 65+) 08/10/2019  . Influenza,inj,Quad PF,6+ Mos 08/04/2018  . Moderna SARS-COVID-2 Vaccination 12/10/2019, 01/11/2020  . Pneumococcal Polysaccharide-23 08/10/2019  . Tdap 02/26/2010, 02/19/2018   2. Vit B12 def ***  No Known Allergies Past Medical History:  Diagnosis Date  . Anxiety    no rx  . Arthritis   . Cancer [facility])    Left mastectomy, 2/11  . Carotid disease, bilateral ([place])    Followed by Dr. Leonie Man  . Carotid disease, bilateral ([place]) 10/21/2011  . Coronary artery disease    cabg 2007  . Diabetes mellitus   . Essential hypertension, benign 07/31/2009   Qualifier: Diagnosis of  By: Mare Ferrari, RMA, Sherri    . HLD (hyperlipidemia)   . Hypertension   . Neuromuscular disorder ([place])   . Neuropathic pain, leg   . Stroke [facility])    more than 10 yrs weakness rt leg  . Stroke, lacunar ([place])    Followed by Dr. Leonie Man  . T2DM (type 2 diabetes mellitus) ([place]) 03/23/2018    Current Outpatient Medications:  .  aspirin 81 MG chewable tablet, Chew 81 mg by mouth daily.  , Disp: , Rfl:  .  blood glucose meter kit and supplies KIT, Dispense based on patient and insurance preference. Use up to four times daily as directed. (DxE11.9/E11.59), Disp: 1 each, Rfl: 0 .   Cholecalciferol (VITAMIN D) 2000 units CAPS, Take 1 capsule (2,000 Units total) by mouth daily., Disp: 90 capsule, Rfl: 1 .  clopidogrel (PLAVIX) 75 MG tablet, TAKE 1 TABLET BY MOUTH EVERY DAY, Disp: 90 tablet, Rfl: 0 .  cyanocobalamin (,VITAMIN B-12,) 1000 MCG/ML injection, Inject 1 mL intramuscularly every week for 4 weeks.  Then inject 1 mL intramuscularly every 28 days., Disp: 10 mL, Rfl: 1 .  DULoxetine (CYMBALTA) 30 MG capsule, TAKE 1 CAPSULE BY MOUTH EVERY DAY, Disp: 90 capsule, Rfl: 1 .  folic acid (FOLVITE) 1 MG tablet, Take 1 tablet (1 mg total) by mouth daily., Disp: 90 tablet, Rfl: 0 .  gabapentin (NEURONTIN) 400 MG capsule, Take 1 capsule (400 mg total) by mouth 3 (three) times daily., Disp: 90 capsule, Rfl: 3 .  glucose blood test strip, Test blood sugar up to 4 times daily. Dx E11.9/ E11.59.  Meter/ strips per insurance/patient preference., Disp: 300 each, Rfl: 4 .  hydrochlorothiazide (MICROZIDE) 12.5 MG capsule, Take 12.5 mg by mouth daily. , Disp: , Rfl:  .  insulin aspart (NOVOLOG FLEXPEN) 100 UNIT/ML FlexPen, INJECT 4 UNITS INTO THE SKIN 2 (TWO) TIMES DAILY WITH A MEAL., Disp: 15 mL, Rfl: 0 .  Lancets MISC, Use to test blood sugar up to 4 times daily (E11.9/E11.59), Disp: 300 each, Rfl: 4 .  lisinopril (ZESTRIL) 20 MG tablet, Take 1 tablet (20 mg total) by mouth daily., Disp: 90 tablet, Rfl: 3 .  magic mouthwash w/lidocaine SOLN, Gargle and spit 69m every 6 hours as needed for sore throat (Patient not taking: Reported on 05/21/2020), Disp: 480 mL, Rfl: 0 .  metFORMIN (GLUCOPHAGE) 1000 MG tablet, TAKE 1 TABLET BY MOUTH TWICE A DAY, Disp: 180 tablet, Rfl: 0 .  metoprolol tartrate (LOPRESSOR) 25 MG tablet, TAKE 1/2 TABLET (12.5 MG TOTAL) BY MOUTH 2 (TWO) TIMES DAILY., Disp: 90 tablet, Rfl: 1 .  naproxen sodium (ALEVE) 220 MG tablet, Take 220 mg by mouth as needed., Disp: , Rfl:  .  oxyCODONE (ROXICODONE) 15 MG immediate release tablet, Take 1 tablet by mouth 4 (four) times daily as  needed., Disp: , Rfl:  .  pravastatin (PRAVACHOL) 40 MG tablet, TAKE 1 TABLET BY MOUTH EVERY DAY, Disp: 90 tablet, Rfl: 3 .  SYRINGE-NEEDLE, DISP, 3 ML (B-D 3CC LUER-LOK SYR 25GX1") 25G X 1" 3 ML MISC, USE TO INJECT B12 INTRAMUSCULARLY AS DIRECTED, Disp: 15 each, Rfl: 0 Social History   Socioeconomic History  . Marital status: Widowed    Spouse name: Not on file  . Number of children: 5  . Years of education: 179 . Highest education level: Not on file  Occupational History  . Not on file  Tobacco Use  . Smoking status: Never Smoker  . Smokeless tobacco: Never Used  Vaping Use  . Vaping Use: Never used  Substance and Sexual Activity  . Alcohol use: No    Alcohol/week: 0.0 standard drinks  . Drug use: No  . Sexual activity: Not Currently    Birth control/protection: Post-menopausal  Other Topics Concern  . Not on file  Social History Narrative   ** Merged History Encounter **       Social Determinants of Health   Financial Resource Strain:   . Difficulty of Paying Living Expenses: Not on file  Food Insecurity:   . Worried About RCharity fundraiserin the Last Year: Not on file  . Ran Out of Food in the Last Year: Not on file  Transportation Needs:   . Lack of Transportation (Medical): Not on file  . Lack of Transportation (Non-Medical): Not on file  Physical Activity:   . Days of Exercise per Week: Not on file  . Minutes of Exercise per Session: Not on file  Stress:   . Feeling of Stress : Not on file  Social Connections:   . Frequency of Communication with Friends and Family: Not on file  . Frequency of Social Gatherings with Friends and Family: Not on file  . Attends Religious Services: Not on file  . Active Member of Clubs or Organizations: Not on file  . Attends CArchivistMeetings: Not on file  . Marital Status: Not on file  Intimate Partner Violence:   . Fear of Current or Ex-Partner: Not on file  . Emotionally Abused: Not on file  . Physically  Abused: Not on file  . Sexually Abused: Not on file   Family History  Problem Relation Age of Onset  . Cancer Mother        breast   . Stroke Mother   . Diabetes Mother   . Hypertension Mother   . Heart attack Father   . Diabetes Sister   . Lung cancer Brother   . Heart disease Brother     Objective: Office vital signs reviewed. There were no vitals taken for this visit.  Physical Examination:  General: Awake, alert, well nourished, No acute distress HEENT: Normal, sclera white,  MMM Cardio: Slightly bradycardic with regular rhythm, S1S2 heard, no murmurs appreciated Pulm: clear to auscultation bilaterally, no wheezes, rhonchi or rales; normal work of breathing on room air Extremities: warm, well perfused, No edema, cyanosis or clubbing; +2 pulses bilaterally MSK: Uses cane for ambulation  Assessment/ Plan: 69 y.o. female   **    No orders of the defined types were placed in this encounter.  No orders of the defined types were placed in this encounter.    Janora Norlander, DO [place] [phone]

## 2020-07-17 ENCOUNTER — Encounter: Payer: Self-pay | Admitting: Family Medicine

## 2020-08-07 ENCOUNTER — Other Ambulatory Visit (HOSPITAL_COMMUNITY): Payer: Self-pay | Admitting: Otolaryngology

## 2020-08-07 DIAGNOSIS — J320 Chronic maxillary sinusitis: Secondary | ICD-10-CM

## 2020-08-09 ENCOUNTER — Other Ambulatory Visit: Payer: Self-pay | Admitting: Family Medicine

## 2020-08-09 DIAGNOSIS — E538 Deficiency of other specified B group vitamins: Secondary | ICD-10-CM

## 2020-08-19 ENCOUNTER — Ambulatory Visit (HOSPITAL_COMMUNITY)
Admission: RE | Admit: 2020-08-19 | Discharge: 2020-08-19 | Disposition: A | Discharge status: Home / Self Care | Payer: Medicare Other | Source: Ambulatory Visit | Attending: Otolaryngology | Admitting: Otolaryngology

## 2020-08-19 ENCOUNTER — Other Ambulatory Visit: Payer: Self-pay

## 2020-08-19 DIAGNOSIS — J320 Chronic maxillary sinusitis: Secondary | ICD-10-CM | POA: Diagnosis present

## 2020-09-07 ENCOUNTER — Other Ambulatory Visit: Payer: Self-pay | Admitting: Family Medicine

## 2020-09-07 DIAGNOSIS — E1159 Type 2 diabetes mellitus with other circulatory complications: Secondary | ICD-10-CM

## 2020-09-09 ENCOUNTER — Other Ambulatory Visit: Payer: Self-pay | Admitting: Otolaryngology

## 2020-09-10 ENCOUNTER — Other Ambulatory Visit: Payer: Self-pay | Admitting: Cardiology

## 2020-10-02 ENCOUNTER — Other Ambulatory Visit: Payer: Self-pay | Admitting: Family Medicine

## 2020-10-03 NOTE — Telephone Encounter (Signed)
[place]. NTBS 30 days given 08/09/20

## 2020-10-09 ENCOUNTER — Other Ambulatory Visit: Payer: Self-pay | Admitting: Family Medicine

## 2020-10-09 DIAGNOSIS — E1159 Type 2 diabetes mellitus with other circulatory complications: Secondary | ICD-10-CM

## 2020-10-10 NOTE — Progress Notes (Signed)
[id number] - [place], [place] [address] [place] [address] Phone: [phone] Fax: [phone]      Your procedure is scheduled on 10/17/2020.  Report to [facility] Main Entrance "A" at 06:30 A.M., and check in at the Admitting office.  Call this number if you have problems the morning of surgery:  [phone]  Call [phone] if you have any questions prior to your surgery date Monday-Friday 8am-4pm    Remember:  Do not eat or drink after midnight the night before your surgery      Take these medicines the morning of surgery with A SIP OF WATER  DULoxetine (CYMBALTA) gabapentin (NEURONTIN)  metoprolol tartrate (LOPRESSOR) pravastatin (PRAVACHOL)  Take the following if needed the morning of surgery.  oxyCODONE (ROXICODONE)   WHAT DO I DO ABOUT MY DIABETES MEDICATION?   Marland Kitchen Do not take Metformin or Ozempic the morning of surgery.  . THE NIGHT BEFORE SURGERY, take your usual dose of Novolog Flexpen with a meal        . THE MORNING OF SURGERY, if CBG greater than 220 mg/dL, may take 1/2 of usual correction dose of Novolog.   . The day of surgery, do not take other diabetes injectables, including Byetta (exenatide), Bydureon (exenatide ER), Victoza (liraglutide), or Trulicity (dulaglutide).  . If your CBG is greater than 220 mg/dL, you may take  of your sliding scale (correction) dose of insulin.   HOW TO MANAGE YOUR DIABETES BEFORE AND AFTER SURGERY  Why is it important to control my blood sugar before and after surgery? . Improving blood sugar levels before and after surgery helps healing and can limit problems. . A way of improving blood sugar control is eating a healthy diet by: o  Eating less sugar and carbohydrates o  Increasing activity/exercise o  Talking with your doctor about reaching your blood sugar goals . High blood sugars (greater than 180 mg/dL) can raise your risk of infections and slow your recovery,  so you will need to focus on controlling your diabetes during the weeks before surgery. . Make sure that the doctor who takes care of your diabetes knows about your planned surgery including the date and location.  How do I manage my blood sugar before surgery? . Check your blood sugar at least 4 times a day, starting 2 days before surgery, to make sure that the level is not too high or low. . Check your blood sugar the morning of your surgery when you wake up and every 2 hours until you get to the Short Stay unit. o If your blood sugar is less than 70 mg/dL, you will need to treat for low blood sugar: - Do not take insulin. - Treat a low blood sugar (less than 70 mg/dL) with  cup of clear juice (cranberry or apple), 4 glucose tablets, OR glucose gel. - Recheck blood sugar in 15 minutes after treatment (to make sure it is greater than 70 mg/dL). If your blood sugar is not greater than 70 mg/dL on recheck, call [phone] for further instructions. . Report your blood sugar to the short stay nurse when you get to Short Stay.  . If you are admitted to the hospital after surgery: o Your blood sugar will be checked by the staff and you will probably be given insulin after surgery (instead of oral diabetes medicines) to make sure you have good blood sugar levels. o The goal for blood sugar control after surgery is  80-180 mg/dL.  Follow your surgeon's instructions on when to stop** Plavix**.  If no instructions were given by your surgeon then you will need to call the office to get those instructions.       As of today, STOP taking an Aleve, Naproxen, Ibuprofen, Motrin, Advil, Goody's, BC's, all herbal medications, fish oil, and all vitamins.                      Do not wear jewelry, make up, or nail polish            Do not wear lotions, powders, perfumes/colognes, or deodorant.            Do not shave 48 hours prior to surgery.  Men may shave face and neck.            Do not bring valuables  to the hospital.            [facility] is not responsible for any belongings or valuables.  Do NOT Smoke (Tobacco/Vaping) or drink Alcohol 24 hours prior to your procedure If you use a CPAP at night, you may bring all equipment for your overnight stay.   Contacts, glasses, dentures or bridgework may not be worn into surgery.      For patients admitted to the hospital, discharge time will be determined by your treatment team.   Patients discharged the day of surgery will not be allowed to drive home, and someone needs to stay with them for 24 hours.    Special instructions:   [facility]- Preparing For Surgery  Before surgery, you can play an important role. Because skin is not sterile, your skin needs to be as free of germs as possible. You can reduce the number of germs on your skin by washing with CHG (chlorahexidine gluconate) Soap before surgery.  CHG is an antiseptic cleaner which kills germs and bonds with the skin to continue killing germs even after washing.    Oral Hygiene is also important to reduce your risk of infection.  Remember - BRUSH YOUR TEETH THE MORNING OF SURGERY WITH YOUR REGULAR TOOTHPASTE  Please do not use if you have an allergy to CHG or antibacterial soaps. If your skin becomes reddened/irritated stop using the CHG.  Do not shave (including legs and underarms) for at least 48 hours prior to first CHG shower. It is OK to shave your face.  Please follow these instructions carefully.   1. Shower the NIGHT BEFORE SURGERY and the MORNING OF SURGERY with CHG Soap.   2. If you chose to wash your hair, wash your hair first as usual with your normal shampoo.  3. After you shampoo, rinse your hair and body thoroughly to remove the shampoo.  4. Use CHG as you would any other liquid soap. You can apply CHG directly to the skin and wash gently with a scrungie or a clean washcloth.   5. Apply the CHG Soap to your body ONLY FROM THE NECK DOWN.  Do not use on open wounds  or open sores. Avoid contact with your eyes, ears, mouth and genitals (private parts). Wash Face and genitals (private parts)  with your normal soap.   6. Wash thoroughly, paying special attention to the area where your surgery will be performed.  7. Thoroughly rinse your body with warm water from the neck down.  8. DO NOT shower/wash with your normal soap after using and rinsing off the CHG Soap.  9.  Pat yourself dry with a CLEAN TOWEL.  10. Wear CLEAN PAJAMAS to bed the night before surgery  11. Place CLEAN SHEETS on your bed the night of your first shower and DO NOT SLEEP WITH PETS.   Day of Surgery: Wear Clean/Comfortable clothing the morning of surgery Do not apply any deodorants/lotions.   Remember to brush your teeth WITH YOUR REGULAR TOOTHPASTE.   Please read over the following fact sheets that you were given.

## 2020-10-13 ENCOUNTER — Inpatient Hospital Stay (HOSPITAL_COMMUNITY)
Admission: RE | Admit: 2020-10-13 | Discharge: 2020-10-13 | Disposition: A | Discharge status: Home / Self Care | Payer: Medicare Other | Source: Ambulatory Visit

## 2020-10-13 NOTE — Telephone Encounter (Signed)
Gottschalk. NTBS 30 days given 09/08/20

## 2020-10-14 ENCOUNTER — Other Ambulatory Visit (HOSPITAL_COMMUNITY)
Admission: RE | Admit: 2020-10-14 | Discharge: 2020-10-14 | Disposition: A | Discharge status: Home / Self Care | Payer: Medicare Other | Source: Ambulatory Visit | Attending: Otolaryngology | Admitting: Otolaryngology

## 2020-10-14 ENCOUNTER — Encounter (HOSPITAL_COMMUNITY)
Admission: RE | Admit: 2020-10-14 | Discharge: 2020-10-14 | Disposition: A | Discharge status: Home / Self Care | Payer: Medicare Other | Source: Ambulatory Visit | Attending: Otolaryngology | Admitting: Otolaryngology

## 2020-10-14 ENCOUNTER — Encounter (HOSPITAL_COMMUNITY): Payer: Self-pay

## 2020-10-14 ENCOUNTER — Other Ambulatory Visit: Payer: Self-pay

## 2020-10-14 DIAGNOSIS — I10 Essential (primary) hypertension: Secondary | ICD-10-CM | POA: Diagnosis not present

## 2020-10-14 DIAGNOSIS — E119 Type 2 diabetes mellitus without complications: Secondary | ICD-10-CM | POA: Insufficient documentation

## 2020-10-14 DIAGNOSIS — I4510 Unspecified right bundle-branch block: Secondary | ICD-10-CM | POA: Diagnosis not present

## 2020-10-14 DIAGNOSIS — Z01818 Encounter for other preprocedural examination: Secondary | ICD-10-CM | POA: Diagnosis not present

## 2020-10-14 DIAGNOSIS — Z01812 Encounter for preprocedural laboratory examination: Secondary | ICD-10-CM | POA: Insufficient documentation

## 2020-10-14 DIAGNOSIS — Z20822 Contact with and (suspected) exposure to covid-19: Secondary | ICD-10-CM | POA: Insufficient documentation

## 2020-10-14 HISTORY — DX: Personal history of urinary calculi: Z87.442

## 2020-10-14 LAB — BASIC METABOLIC PANEL
Anion gap: 9 (ref 5–15)
BUN: 9 mg/dL (ref 8–23)
CO2: 24 mmol/L (ref 22–32)
Calcium: 9.6 mg/dL (ref 8.9–10.3)
Chloride: 104 mmol/L (ref 98–111)
Creatinine, Ser: 0.67 mg/dL (ref 0.44–1.00)
GFR, Estimated: >60 mL/min (ref >60)
Glucose, Bld: 150 mg/dL — ABNORMAL HIGH (ref 70–99)
Potassium: 4.6 mmol/L (ref 3.5–5.1)
Sodium: 137 mmol/L (ref 135–145)

## 2020-10-14 LAB — CBC
HCT: 36.8 % (ref 36.0–46.0)
Hemoglobin: 11.4 g/dL — ABNORMAL LOW (ref 12.0–15.0)
MCH: 27.7 pg (ref 26.0–34.0)
MCHC: 31 g/dL (ref 30.0–36.0)
MCV: 89.5 fL (ref 80.0–100.0)
Platelets: 375 10*3/uL (ref 150–400)
RBC: 4.11 MIL/uL (ref 3.87–5.11)
RDW: 17.8 % — ABNORMAL HIGH (ref 11.5–15.5)
WBC: 4.1 10*3/uL (ref 4.0–10.5)
nRBC: 0 % (ref 0.0–0.2)

## 2020-10-14 LAB — HEMOGLOBIN A1C
Hgb A1c MFr Bld: 7.1 % — ABNORMAL HIGH (ref 4.8–5.6)
Mean Plasma Glucose: 157.07 mg/dL

## 2020-10-14 LAB — SARS CORONAVIRUS 2 (TAT 6-24 HRS): SARS Coronavirus 2: NEGATIVE

## 2020-10-14 LAB — GLUCOSE, CAPILLARY: Glucose-Capillary: 136 mg/dL — ABNORMAL HIGH (ref 70–99)

## 2020-10-14 NOTE — Progress Notes (Addendum)
PCP - Dr. Ronnie Doss; Dr. Adela Lank Cardiologist - Dr. Carlyle Dolly  PPM/ICD - denies  Chest x-ray - N/A EKG - 10/14/2020 Stress Test - 02/16/18 ECHO - 01/26/18 Cardiac Cath - 2011?  Sleep Study - denies CPAP - N/A  Fasting Blood Sugar - averages 135 per patient Checks Blood Sugar 3 times/day  Blood Thinner Instructions: PLAVIX: per patient last dose 10/12/2020 Aspirin Instructions: per patient last dose 10/12/2020  ERAS Protcol - No  COVID TEST- 10/14/2020, test results pending. Patient verbalized understanding of self-quarantine instructions.  Anesthesia review: YES, CAD hx, cardiac hx, EKG  Patient denies shortness of breath, fever, cough and chest pain at PAT appointment  All instructions explained to the patient, with a verbal understanding of the material. Patient agrees to go over the instructions while at home for a better understanding. Patient also instructed to self quarantine after being tested for COVID-19. The opportunity to ask questions was provided.

## 2020-10-15 NOTE — Telephone Encounter (Signed)
Attempted to contact - NA 

## 2020-10-15 NOTE — Anesthesia Preprocedure Evaluation (Addendum)
Anesthesia Evaluation  Patient identified by MRN, date of birth, ID band Patient awake    Reviewed: Allergy & Precautions, NPO status , Patient's Chart, lab work & pertinent test results  Airway Mallampati: I  TM Distance: >3 FB Neck ROM: Full    Dental no notable dental hx. (+) Edentulous Upper, Edentulous Lower   Pulmonary neg pulmonary ROS,    Pulmonary exam normal breath sounds clear to auscultation       Cardiovascular hypertension, Pt. on medications and Pt. on home beta blockers + CAD  Normal cardiovascular exam+ dysrhythmias  Rhythm:Regular Rate:Normal     Neuro/Psych Anxiety RESIDUAL LEFT LEG WEAKNESS  Neuromuscular disease CVA, Residual Symptoms negative psych ROS   GI/Hepatic negative GI ROS, Neg liver ROS,   Endo/Other  diabetes  Renal/GU negative Renal ROS  negative genitourinary   Musculoskeletal  (+) Arthritis , Osteoarthritis,    Abdominal   Peds negative pediatric ROS (+)  Hematology negative hematology ROS (+)   Anesthesia Other Findings   Reproductive/Obstetrics negative OB ROS                          Anesthesia Physical Anesthesia Plan  ASA: III  Anesthesia Plan: General   Post-op Pain Management:    Induction: Intravenous  PONV Risk Score and Plan:   Airway Management Planned: Oral ETT  Additional Equipment:   Intra-op Plan:   Post-operative Plan: Extubation in OR  Informed Consent:   Plan Discussed with:   Anesthesia Plan Comments: (PAT note by Karoline Caldwell, PA-C: 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 Nuclear stress 02/2018 no ischemia. Last seen by Dr. Harl Bowie 01/07/20. Per note, no recurrent CAD symptoms. Regarding PVCs, "PVCs: -high burden based on prior holter monitor. Recent echo LVEF 50-55%, recent nuclear stress without current ischemia. Possibly due to prior scar related to her history of CAD and prior CABG. - dynamap heart  rates falsesly low due to PVCs.- remains asymptomatic, continue to monitor." She was advised to followup in one year.  Follows with heme/onc for history of left breast cancer diagnosed January 2011. Patient was treated with mastectomy, axillary lymph node dissection followed by Taxotere/Cytoxan, chest wall radiation and letrozole.  Hx of CVA on ASA and Plavix. Per pt, LD Plavix 10/12/20.  IDDM2, last A1c 7.1 on 10/14/20.   Preop albs reviewed, unremarkable.   10/14/20: Normal sinus rhythm. Rate 66. Right bundle branch block. T wave abnormality, consider lateral ischemia. No significant change since last tracing  Nuclear stress 02/16/18: No diagnostic ST segment changes to indicate ischemia. Frequent PVCs noted throughout. No sustained arrhythmias. Small, mild intensity, mid to apical anterior and basal inferoseptal defects that are fixed and most consistent with soft tissue attenuation versus scar. This is an intermediate risk study based on reduced LVEF. Nuclear stress EF: 41%. Would question whether frequent PVCs adversely affect gating. Consider echocardiogram for confirmation.  TTE 01/26/18: - Left ventricle: The cavity size was normal. Wall thickness was  normal. Systolic function was normal. The estimated ejection  fraction was in the range of 50% to 55%. Wall motion was normal;  there were no regional wall motion abnormalities.  - Aortic valve: Mildly calcified annulus. Trileaflet.  - Mitral valve: Mildly thickened leaflets . There was mild  regurgitation.  - Right atrium: Central venous pressure (est): 3 mm Hg.  - Atrial septum: No defect or patent foramen ovale was identified.  - Tricuspid valve: There was mild regurgitation.  -  Pulmonary arteries: PA peak pressure: 33 mm Hg (S).  - Pericardium, extracardiac: There was no pericardial effusion.   Event monitor 01/11/18: Min HR 59, Max HR 122, Avg HR 81. Primary rhythm is sinus rhythm No supraventricular ectopy Very  frequent ventricular ectopy (44%). In the the form of isolated PVCs, coupltes, bigeminy, trigeminy.Short runs of NSVT up to 5 beats No diary submitted No sustained arrhythmias   Carotid ultrasound 11/23/17: Final Interpretation:  Right Carotid: There is no evidence of stenosis in the right ICA.   Left Carotid: There is no evidence of stenosis in the left ICA.   Vertebrals: Both vertebral arteries were patent with antegrade flow.  Subclavians: Normal flow hemodynamics were seen in bilateral subclavian arteries.  )       Anesthesia Quick Evaluation

## 2020-10-15 NOTE — Progress Notes (Signed)
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 Nuclear stress 02/2018 no ischemia. Last seen by Dr. Harl Bowie 01/07/20. Per note, no recurrent CAD symptoms. Regarding PVCs, "PVCs: -high burden based on prior holter monitor. Recent echo LVEF 50-55%, recent nuclear stress without current ischemia. Possibly due to prior scar related to her history of CAD and prior CABG. - dynamap heart rates falsesly low due to PVCs.- remains asymptomatic, continue to monitor." She was advised to followup in one year.  Follows with heme/onc for history of left breast cancer diagnosed January 2011. Patient was treated with mastectomy, axillary lymph node dissection followed by Taxotere/Cytoxan, chest wall radiation and letrozole.  Hx of CVA on ASA and Plavix. Per pt, LD Plavix 10/12/20.  IDDM2, last A1c 7.1 on 10/14/20.   Preop albs reviewed, unremarkable.   10/14/20: Normal sinus rhythm. Rate 66. Right bundle branch block. T wave abnormality, consider lateral ischemia. No significant change since last tracing  Nuclear stress 02/16/18:  No diagnostic ST segment changes to indicate ischemia. Frequent PVCs noted throughout. No sustained arrhythmias.  Small, mild intensity, mid to apical anterior and basal inferoseptal defects that are fixed and most consistent with soft tissue attenuation versus scar.  This is an intermediate risk study based on reduced LVEF.  Nuclear stress EF: 41%. Would question whether frequent PVCs adversely affect gating. Consider echocardiogram for confirmation.  TTE 01/26/18: - Left ventricle: The cavity size was normal. Wall thickness was  normal. Systolic function was normal. The estimated ejection  fraction was in the range of 50% to 55%. Wall motion was normal;  there were no regional wall motion abnormalities.  - Aortic valve: Mildly calcified annulus. Trileaflet.  - Mitral valve: Mildly thickened leaflets . There was mild  regurgitation.   - Right atrium: Central venous pressure (est): 3 mm Hg.  - Atrial septum: No defect or patent foramen ovale was identified.  - Tricuspid valve: There was mild regurgitation.  - Pulmonary arteries: PA peak pressure: 33 mm Hg (S).  - Pericardium, extracardiac: There was no pericardial effusion.   Event monitor 01/11/18:  Min HR 59, Max HR 122, Avg HR 81. Primary rhythm is sinus rhythm  No supraventricular ectopy  Very frequent ventricular ectopy (44%). In the the form of isolated PVCs, coupltes, bigeminy, trigeminy.Short runs of NSVT up to 5 beats  No diary submitted  No sustained arrhythmias   Carotid ultrasound 11/23/17: Final Interpretation:  Right Carotid: There is no evidence of stenosis in the right ICA.   Left Carotid: There is no evidence of stenosis in the left ICA.   Vertebrals: Both vertebral arteries were patent with antegrade flow.  Subclavians: Normal flow hemodynamics were seen in bilateral subclavian arteries.     [name] [facility] Short Stay Center/Anesthesiology Phone [phone] 10/15/2020 1:26 PM

## 2020-10-17 ENCOUNTER — Ambulatory Visit (HOSPITAL_COMMUNITY): Payer: Medicare Other | Admitting: Anesthesiology

## 2020-10-17 ENCOUNTER — Encounter (HOSPITAL_COMMUNITY)
Admission: RE | Disposition: A | Discharge status: Home / Self Care | Payer: Self-pay | Source: Home / Self Care | Attending: Otolaryngology

## 2020-10-17 ENCOUNTER — Ambulatory Visit (HOSPITAL_COMMUNITY)
Admission: RE | Admit: 2020-10-17 | Discharge: 2020-10-17 | Disposition: A | Discharge status: Home / Self Care | Payer: Medicare Other | Attending: Otolaryngology | Admitting: Otolaryngology

## 2020-10-17 ENCOUNTER — Encounter (HOSPITAL_COMMUNITY): Payer: Self-pay | Admitting: Otolaryngology

## 2020-10-17 ENCOUNTER — Ambulatory Visit (HOSPITAL_COMMUNITY): Payer: Medicare Other | Admitting: Physician Assistant

## 2020-10-17 ENCOUNTER — Other Ambulatory Visit: Payer: Self-pay

## 2020-10-17 DIAGNOSIS — Z79811 Long term (current) use of aromatase inhibitors: Secondary | ICD-10-CM | POA: Diagnosis not present

## 2020-10-17 DIAGNOSIS — C50912 Malignant neoplasm of unspecified site of left female breast: Secondary | ICD-10-CM | POA: Diagnosis not present

## 2020-10-17 DIAGNOSIS — Z9012 Acquired absence of left breast and nipple: Secondary | ICD-10-CM | POA: Diagnosis not present

## 2020-10-17 DIAGNOSIS — Z951 Presence of aortocoronary bypass graft: Secondary | ICD-10-CM | POA: Diagnosis not present

## 2020-10-17 DIAGNOSIS — J320 Chronic maxillary sinusitis: Secondary | ICD-10-CM | POA: Insufficient documentation

## 2020-10-17 DIAGNOSIS — E118 Type 2 diabetes mellitus with unspecified complications: Secondary | ICD-10-CM | POA: Insufficient documentation

## 2020-10-17 DIAGNOSIS — J322 Chronic ethmoidal sinusitis: Secondary | ICD-10-CM | POA: Diagnosis not present

## 2020-10-17 DIAGNOSIS — I2510 Atherosclerotic heart disease of native coronary artery without angina pectoris: Secondary | ICD-10-CM | POA: Diagnosis not present

## 2020-10-17 DIAGNOSIS — Z7982 Long term (current) use of aspirin: Secondary | ICD-10-CM | POA: Insufficient documentation

## 2020-10-17 DIAGNOSIS — I4510 Unspecified right bundle-branch block: Secondary | ICD-10-CM | POA: Diagnosis not present

## 2020-10-17 DIAGNOSIS — J321 Chronic frontal sinusitis: Secondary | ICD-10-CM | POA: Diagnosis present

## 2020-10-17 DIAGNOSIS — J342 Deviated nasal septum: Secondary | ICD-10-CM | POA: Diagnosis not present

## 2020-10-17 DIAGNOSIS — J338 Other polyp of sinus: Secondary | ICD-10-CM | POA: Insufficient documentation

## 2020-10-17 DIAGNOSIS — Z923 Personal history of irradiation: Secondary | ICD-10-CM | POA: Diagnosis not present

## 2020-10-17 HISTORY — PX: SINUS ENDO WITH FUSION: SHX5329

## 2020-10-17 HISTORY — PX: FRONTAL SINUS EXPLORATION: SHX6591

## 2020-10-17 HISTORY — PX: SEPTOPLASTY WITH ETHMOIDECTOMY, AND MAXILLARY ANTROSTOMY: SHX6090

## 2020-10-17 LAB — GLUCOSE, CAPILLARY
Glucose-Capillary: 132 mg/dL — ABNORMAL HIGH (ref 70–99)
Glucose-Capillary: 124 mg/dL — ABNORMAL HIGH (ref 70–99)

## 2020-10-17 SURGERY — FESS, WITH MAXILLARY ANTROSTOMY, SEPTOPLASTY, AND ETHMOIDECTOMY
Anesthesia: General | Site: Nose | Laterality: Left

## 2020-10-17 MED ORDER — ONDANSETRON HCL 4 MG/2ML IJ SOLN: 4.0000 mg | Freq: Once | INTRAMUSCULAR | Status: DC | PRN

## 2020-10-17 MED ORDER — OXYCODONE HCL 5 MG/5ML PO SOLN: 5.0000 mg | Freq: Once | ORAL | Status: DC | PRN

## 2020-10-17 MED ORDER — LIDOCAINE-EPINEPHRINE 1 %-1:100000 IJ SOLN
INTRAMUSCULAR | Status: AC
  Filled 2020-10-17: qty 1

## 2020-10-17 MED ORDER — PROPOFOL 10 MG/ML IV BOLUS
INTRAVENOUS | Status: DC | PRN
  Administered 2020-10-17: 110 mg via INTRAVENOUS

## 2020-10-17 MED ORDER — METOPROLOL TARTRATE 12.5 MG HALF TABLET
12.5000 mg | ORAL_TABLET | Freq: Once | ORAL | Status: AC
  Administered 2020-10-17: 12.5 mg via ORAL
  Filled 2020-10-17: qty 1

## 2020-10-17 MED ORDER — OXYCODONE HCL 5 MG PO TABS: 5.0000 mg | ORAL_TABLET | Freq: Once | ORAL | Status: DC | PRN

## 2020-10-17 MED ORDER — 0.9 % SODIUM CHLORIDE (POUR BTL) OPTIME
TOPICAL | Status: DC | PRN
  Administered 2020-10-17: 1000 mL

## 2020-10-17 MED ORDER — LIDOCAINE HCL (PF) 2 % IJ SOLN
INTRAMUSCULAR | Status: AC
  Filled 2020-10-17: qty 5

## 2020-10-17 MED ORDER — BACITRACIN ZINC 500 UNIT/GM EX OINT
TOPICAL_OINTMENT | CUTANEOUS | Status: AC
  Filled 2020-10-17: qty 28.35

## 2020-10-17 MED ORDER — FENTANYL CITRATE (PF) 250 MCG/5ML IJ SOLN
INTRAMUSCULAR | Status: AC
  Filled 2020-10-17: qty 5

## 2020-10-17 MED ORDER — FENTANYL CITRATE (PF) 100 MCG/2ML IJ SOLN
INTRAMUSCULAR | Status: DC | PRN
  Administered 2020-10-17: 100 ug via INTRAVENOUS

## 2020-10-17 MED ORDER — OXYMETAZOLINE HCL 0.05 % NA SOLN
NASAL | Status: DC | PRN
  Administered 2020-10-17: 1

## 2020-10-17 MED ORDER — MIDAZOLAM HCL 5 MG/5ML IJ SOLN
INTRAMUSCULAR | Status: DC | PRN
  Administered 2020-10-17: 1 mg via INTRAVENOUS

## 2020-10-17 MED ORDER — LACTATED RINGERS IV SOLN: INTRAVENOUS | Status: DC

## 2020-10-17 MED ORDER — MUPIROCIN CALCIUM 2 % EX CREA
TOPICAL_CREAM | CUTANEOUS | Status: AC
  Filled 2020-10-17: qty 15

## 2020-10-17 MED ORDER — AMOXICILLIN-POT CLAVULANATE 875-125 MG PO TABS: 1.0000 | ORAL_TABLET | Freq: Two times a day (BID) | ORAL | 0 refills | Status: AC

## 2020-10-17 MED ORDER — ROCURONIUM BROMIDE 10 MG/ML (PF) SYRINGE
PREFILLED_SYRINGE | INTRAVENOUS | Status: DC | PRN
  Administered 2020-10-17: 40 mg via INTRAVENOUS

## 2020-10-17 MED ORDER — LIDOCAINE-EPINEPHRINE 1 %-1:100000 IJ SOLN
INTRAMUSCULAR | Status: DC | PRN
  Administered 2020-10-17: 3 mL

## 2020-10-17 MED ORDER — SODIUM CHLORIDE 0.9 % IR SOLN
Status: DC | PRN
  Administered 2020-10-17: 1000 mL

## 2020-10-17 MED ORDER — SUGAMMADEX SODIUM 200 MG/2ML IV SOLN
INTRAVENOUS | Status: DC | PRN
  Administered 2020-10-17: 200 mg via INTRAVENOUS

## 2020-10-17 MED ORDER — ONDANSETRON HCL 4 MG/2ML IJ SOLN
INTRAMUSCULAR | Status: AC
  Filled 2020-10-17: qty 2

## 2020-10-17 MED ORDER — ROCURONIUM BROMIDE 10 MG/ML (PF) SYRINGE
PREFILLED_SYRINGE | INTRAVENOUS | Status: AC
  Filled 2020-10-17: qty 10

## 2020-10-17 MED ORDER — MIDAZOLAM HCL 2 MG/2ML IJ SOLN
INTRAMUSCULAR | Status: AC
  Filled 2020-10-17: qty 2

## 2020-10-17 MED ORDER — PHENYLEPHRINE 40 MCG/ML (10ML) SYRINGE FOR IV PUSH (FOR BLOOD PRESSURE SUPPORT)
PREFILLED_SYRINGE | INTRAVENOUS | Status: DC | PRN
  Administered 2020-10-17: 80 ug via INTRAVENOUS

## 2020-10-17 MED ORDER — PROPOFOL 10 MG/ML IV BOLUS
INTRAVENOUS | Status: AC
  Filled 2020-10-17: qty 20

## 2020-10-17 MED ORDER — CHLORHEXIDINE GLUCONATE 0.12 % MT SOLN
15.0000 mL | Freq: Once | OROMUCOSAL | Status: AC
  Administered 2020-10-17: 15 mL via OROMUCOSAL
  Filled 2020-10-17: qty 15

## 2020-10-17 MED ORDER — OXYMETAZOLINE HCL 0.05 % NA SOLN
NASAL | Status: AC
  Filled 2020-10-17: qty 30

## 2020-10-17 MED ORDER — ACETAMINOPHEN 325 MG PO TABS: 325.0000 mg | ORAL_TABLET | ORAL | Status: DC | PRN

## 2020-10-17 MED ORDER — PHENYLEPHRINE 40 MCG/ML (10ML) SYRINGE FOR IV PUSH (FOR BLOOD PRESSURE SUPPORT)
PREFILLED_SYRINGE | INTRAVENOUS | Status: AC
  Filled 2020-10-17: qty 10

## 2020-10-17 MED ORDER — FENTANYL CITRATE (PF) 100 MCG/2ML IJ SOLN: 25.0000 ug | INTRAMUSCULAR | Status: DC | PRN

## 2020-10-17 MED ORDER — MUPIROCIN 2 % EX OINT
TOPICAL_OINTMENT | CUTANEOUS | Status: AC
  Filled 2020-10-17: qty 22

## 2020-10-17 MED ORDER — DEXAMETHASONE SODIUM PHOSPHATE 10 MG/ML IJ SOLN
INTRAMUSCULAR | Status: AC
  Filled 2020-10-17: qty 1

## 2020-10-17 MED ORDER — ORAL CARE MOUTH RINSE: 15.0000 mL | Freq: Once | OROMUCOSAL | Status: AC

## 2020-10-17 MED ORDER — MEPERIDINE HCL 25 MG/ML IJ SOLN: 6.2500 mg | INTRAMUSCULAR | Status: DC | PRN

## 2020-10-17 MED ORDER — DEXAMETHASONE SODIUM PHOSPHATE 10 MG/ML IJ SOLN
INTRAMUSCULAR | Status: DC | PRN
  Administered 2020-10-17: 5 mg via INTRAVENOUS

## 2020-10-17 MED ORDER — ACETAMINOPHEN 160 MG/5ML PO SOLN: 325.0000 mg | ORAL | Status: DC | PRN

## 2020-10-17 MED ORDER — STERILE WATER FOR IRRIGATION IR SOLN
Status: DC | PRN
  Administered 2020-10-17: 1000 mL

## 2020-10-17 MED ORDER — MUPIROCIN CALCIUM 2 % EX CREA
TOPICAL_CREAM | CUTANEOUS | Status: DC | PRN
  Administered 2020-10-17: 1 via TOPICAL

## 2020-10-17 MED ORDER — ONDANSETRON HCL 4 MG/2ML IJ SOLN
INTRAMUSCULAR | Status: DC | PRN
  Administered 2020-10-17: 4 mg via INTRAVENOUS

## 2020-10-17 MED ORDER — LIDOCAINE 2% (20 MG/ML) 5 ML SYRINGE
INTRAMUSCULAR | Status: DC | PRN
  Administered 2020-10-17: 100 mg via INTRAVENOUS

## 2020-10-17 SURGICAL SUPPLY — 37 items
BLADE ROTATE TRICUT 4MX13CM M4 (BLADE) ×1
BLADE ROTATE TRICUT 4X13 M4 (BLADE) ×2 IMPLANT
CANISTER SUCT 3000ML PPV (MISCELLANEOUS) ×5 IMPLANT
CNTNR URN SCR LID CUP LEK RST (MISCELLANEOUS) ×1 IMPLANT
CONT SPEC 4OZ STRL OR WHT (MISCELLANEOUS) ×3
DECANTER SPIKE VIAL GLASS SM (MISCELLANEOUS) ×3 IMPLANT
DRSG NASOPORE 8CM (GAUZE/BANDAGES/DRESSINGS) ×2 IMPLANT
GAUZE SPONGE 2X2 8PLY STRL LF (GAUZE/BANDAGES/DRESSINGS) ×1 IMPLANT
GLOVE ECLIPSE 7.5 STRL STRAW (GLOVE) ×3 IMPLANT
GOWN STRL REUS W/ TWL LRG LVL3 (GOWN DISPOSABLE) ×2 IMPLANT
GOWN STRL REUS W/TWL LRG LVL3 (GOWN DISPOSABLE) ×6
KIT BASIN OR (CUSTOM PROCEDURE TRAY) ×3 IMPLANT
KIT TURNOVER KIT B (KITS) ×3 IMPLANT
NDL HYPO 25GX1X1/2 BEV (NEEDLE) IMPLANT
NDL SPNL 25GX3.5 QUINCKE BL (NEEDLE) ×1 IMPLANT
NEEDLE HYPO 25GX1X1/2 BEV (NEEDLE) ×3 IMPLANT
NEEDLE SPNL 25GX3.5 QUINCKE BL (NEEDLE) ×3 IMPLANT
NS IRRIG 1000ML POUR BTL (IV SOLUTION) ×3 IMPLANT
PAD ARMBOARD 7.5X6 YLW CONV (MISCELLANEOUS) ×6 IMPLANT
POSITIONER HEAD DONUT 9IN (MISCELLANEOUS) ×3 IMPLANT
SPLINT NASAL DOYLE BI-VL (GAUZE/BANDAGES/DRESSINGS) ×2 IMPLANT
SPONGE GAUZE 2X2 STER 10/PKG (GAUZE/BANDAGES/DRESSINGS) ×2
SPONGE NEURO XRAY DETECT 1X3 (DISPOSABLE) ×3 IMPLANT
SUT CHROMIC 3 0 SH 27 (SUTURE) ×2 IMPLANT
SUT CHROMIC 4 0 S 4 (SUTURE) ×2 IMPLANT
SUT PLAIN 4 0 ~~LOC~~ 1 (SUTURE) ×2 IMPLANT
SUT PROLENE 2 0 FS (SUTURE) ×2 IMPLANT
SYR 50ML LL SCALE MARK (SYRINGE) ×2 IMPLANT
TOWEL GREEN STERILE FF (TOWEL DISPOSABLE) ×3 IMPLANT
TRACKER ENT INSTRUMENT (MISCELLANEOUS) ×3 IMPLANT
TRACKER ENT PATIENT (MISCELLANEOUS) ×3 IMPLANT
TRAY ENT MC OR (CUSTOM PROCEDURE TRAY) ×3 IMPLANT
TUBE CONNECTING 12'X1/4 (SUCTIONS) ×1
TUBE CONNECTING 12X1/4 (SUCTIONS) ×2 IMPLANT
TUBE SALEM SUMP 16 FR W/ARV (TUBING) ×3 IMPLANT
TUBING STRAIGHTSHOT EPS 5PK (TUBING) ×3 IMPLANT
WATER STERILE IRR 1000ML POUR (IV SOLUTION) ×3 IMPLANT

## 2020-10-17 NOTE — Op Note (Signed)
DATE OF PROCEDURE: 10/17/2020  OPERATIVE REPORT   SURGEON: Leta Baptist, MD   PREOPERATIVE DIAGNOSES:  1.  Chronic left maxillary, ethmoid, and frontal sinusitis. 2.  Severe nasal septal deviation and septal spur.  POSTOPERATIVE DIAGNOSES:  1.  Chronic left maxillary, ethmoid, and frontal sinusitis. 2.  Severe nasal septal deviation and septal spur. 3.  Left sinonasal polyps.  PROCEDURE PERFORMED:  1.  Septoplasty.  2.  Left endoscopic frontal sinusotomy and total ethmoidectomy with polyp removal. 3.  Left endoscopic maxillary antrostomy with polyp removal. 4.  FUSION stereotactic image guidance.  ANESTHESIA: General endotracheal tube anesthesia.   COMPLICATIONS: None.   ESTIMATED BLOOD LOSS: 150 mL.   INDICATION FOR PROCEDURE: [name] is a 69 y.o. female with a history of chronic left-sided sinus infections.  She was previously treated with multiple courses of antibiotics.  Her CT scan in August showed near total opacifications of her left frontal, ethmoid and maxillary sinuses.  Areas of hyperdensity were noted in the left maxillary sinus, concerning for a fungal infection.  She was subsequently treated with Augmentin and Diflucan for 3 weeks.  Her follow up CT scan showed minimal improvement in her sinusitis.  She continues to have significant opacification of her left maxillary, ethmoid and frontal sinuses.  The patient also has significant nasal septal deviation with a large left septal spur.  Based on the above findings, the decision was made for the patient to undergo the above-stated procedures. The risks, benefits, alternatives, and details of the procedures were discussed with the patient. Questions were invited and answered. Informed consent was obtained.   DESCRIPTION OF PROCEDURE: The patient was taken to the operating room and placed supine on the operating table. General endotracheal tube anesthesia was administered by the anesthesiologist. The patient was positioned,  and prepped and draped in the standard fashion for nasal surgery. Pledgets soaked with Afrin were placed in both nasal cavities for decongestion. The pledgets were subsequently removed. The FUSION stereotactic image guidance marker was placed. The image guidance system was functional throughout the case.  Examination of the nasal cavity revealed a large left septal spur and nasal septal deviation. 1% lidocaine with 1:100,000 epinephrine was injected onto the nasal septum bilaterally. A hemitransfixion incision was made on the left side. The mucosal flap was carefully elevated on the left side. A cartilaginous incision was made 1 cm superior to the caudal margin of the nasal septum. Mucosal flap was also elevated on the right side in the similar fashion. It should be noted that due to the severe septal deviation, the deviated portion of the cartilaginous and bony septum had to be removed in piecemeal fashion. Once the deviated portions were removed, a straight midline septum was achieved. The septum was then quilted with 4-0 plain gut sutures. The hemitransfixion incision was closed with interrupted 4-0 chromic sutures.   Using a 0 endoscope, the left nasal cavity was examined.  The left middle turbinate was medialized.  A large amount of mucopurulent drainage was noted.  Polypoid tissue was noted within the middle meatus. The polypoid tissue was removed using a combination of microdebrider and Blakesley forceps. The uncinate process was resected with a freer elevator. The maxillary antrum was entered and enlarged using a combination of backbiter and microdebrider. Polypoid tissue was removed from the left maxillary sinus.  Mucopurulent drainage was evacuated.  Attention was then focused on the ethmoid sinuses. The bony partitions of the anterior and posterior ethmoid cavities were taken down. Polypoid tissue  was noted and removed. Attention was then focused on the frontal sinus. The frontal recess was  identified and enlarged by removing the surrounding bony partitions. Polypoid tissue was removed from the frontal recess. All sinuses were copiously irrigated with saline solution. Doyle splints were applied to the nasal septum.  The care of the patient was turned over to the anesthesiologist. The patient was awakened from anesthesia without difficulty. The patient was extubated and transferred to the recovery room in good condition.   OPERATIVE FINDINGS: Left chronic maxillary, ethmoid, and frontal sinusitis, with polyposis.  A large amount of mucopurulent drainage was evacuated today.  Nasal septal deviation with a large left septal spur.  SPECIMEN: Left sinus contents.  FOLLOWUP CARE: The patient be discharged home once she sees is awake and alert. The patient will be placed on Augmentin 875 mg p.o. b.i.d. for 5 days. The patient will follow up in my office in 3 days for splint removal.   Tesia Lybrand Raynelle Bring, MD

## 2020-10-17 NOTE — H&P (Signed)
Cc: Chronic sinus infections  HPI: The patient is a 69 year old female who returns today for her follow-up evaluation. The patient was last seen 1 month ago.  At that time, she was complaining of chronic left-sided sinus infections.  She was previously treated with multiple courses of antibiotics.  Her CT scan in August showed near total opacifications of her left frontal, ethmoid and maxillary sinuses.  Areas of hyperdensity were noted in the left maxillary sinus, concerning for a fungal infection.  She was subsequently treated with Augmentin and Diflucan for 3 weeks.  Her follow up CT scan showed minimal improvement in her sinusitis.  She continues to have significant opacification of her left maxillary, ethmoid and frontal sinuses.  She returns today complaining of persistent left-sided facial pressure and pain.  She continues to have a foul smelling odor from the left nasal cavity.   Exam: The flexible scope was inserted into the right nasal cavity.  Endoscopy of the interior nasal cavity, superior, inferior, and middle meatus was performed. The sphenoid-ethmoid recess was examined. Edematous mucosa was noted.  No polyp, mass, or lesion was appreciated.  Olfactory cleft was clear.  Nasopharynx was clear.  Turbinates were hypertrophied but without mass. The procedure was repeated on the contralateral side. Severe left nasal septal deviation noted and septal spur. Mucopurulent drainage was noted from the left middle meatus. The patient tolerated the procedure well.  Instructions were given to avoid eating or drinking for 2 hours.  Assessment: 1.  Chronic left frontal, ethmoid, and maxillary sinusitis.  Mucopurulent drainage is again noted from the left middle meatus.  2.  Her CT scan showed findings concerning for fungal sinusitis.   3.  Severe left septal deviation and septal spur.     Plan:  1.  The nasal endoscopy findings and the CT images are reviewed with the patient and her daughter.  2.   Based on the above findings, the patient will likely need to undergo surgical intervention with septoplasty and left-sided sinus surgery, involving the left frontal, maxillary, and ethmoid sinuses.  3.  The risks, benefits, alternatives and details of the surgical procedures are reviewed with the patient.  The patient would like to proceed with the procedures.

## 2020-10-17 NOTE — Discharge Instructions (Addendum)
POSTOPERATIVE INSTRUCTIONS FOR PATIENTS HAVING NASAL OR SINUS OPERATIONS ACTIVITY: Restrict activity at home for the first two days, resting as much as possible. Light activity is best. You may usually return to work within a week. You should refrain from nose blowing, strenuous activity, or heavy lifting greater than 20lbs for a total of one week after your operation.  If sneezing cannot be avoided, sneeze with your mouth open. DISCOMFORT: You may experience a dull headache and pressure along with nasal congestion and discharge. These symptoms may be worse during the first week after the operation but may last as long as two to four weeks.  Please take Tylenol or the pain medication that has been prescribed for you. Do not take aspirin or aspirin containing medications since they may cause bleeding.  You may experience symptoms of post nasal drainage, nasal congestion, headaches and fatigue for two or three months after your operation.  BLEEDING: You may have some blood tinged nasal drainage for approximately two weeks after the operation.  The discharge will be worse for the first week.  Please call our office at [phone] or go to the nearest hospital emergency room if you experience any of the following: heavy, bright red blood from your nose or mouth that lasts longer than 15 minutes or coughing up or vomiting bright red blood or blood clots. GENERAL CONSIDERATIONS: 1. A gauze dressing will be placed on your upper lip to absorb any drainage after the operation. You may need to change this several times a day.  If you do not have very much drainage, you may remove the dressing.  Remember that you may gently wipe your nose with a tissue and sniff in, but DO NOT blow your nose. 2. Please keep all of your postoperative appointments.  Your final results after the operation will depend on proper follow-up.  The initial visit is usually 2 to 5 days after the operation.  During this visit, the remaining nasal  packing and internal septal splints will be removed.  Your nasal and sinus cavities will be cleaned.  During the second visit, your nasal and sinus cavities will be cleaned again. Have someone drive you to your first two postoperative appointments.  3. How you care for your nose after the operation will influence the results that you obtain.  You should follow all directions, take your medication as prescribed, and call our office [phone] with any problems or questions. 4. You may be more comfortable sleeping with your head elevated on two pillows. 5. Do not take any medications that we have not prescribed or recommended. WARNING SIGNS: if any of the following should occur, please call our office: 1. Persistent fever greater than 102F. 2. Persistent vomiting. 3. Severe and constant pain that is not relieved by prescribed pain medication. 4. Trauma to the nose. 5. Rash or unusual side effects from any medicines.  

## 2020-10-17 NOTE — Anesthesia Procedure Notes (Signed)
Procedure Name: Intubation Date/Time: 10/17/2020 8:25 AM Performed by: Moshe Salisbury, CRNA Pre-anesthesia Checklist: Patient identified, Emergency Drugs available, Suction available and Patient being monitored Patient Re-evaluated:Patient Re-evaluated prior to induction Oxygen Delivery Method: Circle System Utilized Preoxygenation: Pre-oxygenation with 100% oxygen Induction Type: IV induction Ventilation: Mask ventilation without difficulty Laryngoscope Size: Mac and 3 Grade View: Grade I Tube type: Oral Rae Tube size: 7.5 mm Number of attempts: 1 Placement Confirmation: ETT inserted through vocal cords under direct vision,  positive ETCO2 and breath sounds checked- equal and bilateral Secured at: 21 cm Tube secured with: Tape Dental Injury: Teeth and Oropharynx as per pre-operative assessment

## 2020-10-17 NOTE — Transfer of Care (Signed)
Immediate Anesthesia Transfer of Care Note  Patient: [name]  Procedure(s) Performed: SEPTOPLASTY WITH ETHMOIDECTOMY, AND MAXILLARY ANTROSTOMY (Left Nose) FRONTAL SINUS EXPLORATION (Left Nose) SINUS ENDO WITH FUSION (Left Nose)  Patient Location: PACU  Anesthesia Type:General  Level of Consciousness: drowsy and patient cooperative  Airway & Oxygen Therapy: Patient Spontanous Breathing and Patient connected to face mask oxygen  Post-op Assessment: Report given to RN, Post -op Vital signs reviewed and stable and Patient moving all extremities  Post vital signs: Reviewed and stable  Last Vitals:  Vitals Value Taken Time  BP 126/69 10/17/20 0947  Temp    Pulse 80 10/17/20 0949  Resp 16 10/17/20 0949  SpO2 100 % 10/17/20 0949  Vitals shown include unvalidated device data.  Last Pain:  Vitals:   10/17/20 0655  TempSrc:   PainSc: 0-No pain         Complications: No complications documented.

## 2020-10-17 NOTE — Anesthesia Postprocedure Evaluation (Signed)
Anesthesia Post Note  Patient: TABETHA HARAWAY  Procedure(s) Performed: SEPTOPLASTY WITH ETHMOIDECTOMY, AND MAXILLARY ANTROSTOMY (Left Nose) FRONTAL SINUS EXPLORATION (Left Nose) SINUS ENDO WITH FUSION (Left Nose)     Patient location during evaluation: PACU Anesthesia Type: General Level of consciousness: awake and alert Pain management: pain level controlled Vital Signs Assessment: post-procedure vital signs reviewed and stable Respiratory status: spontaneous breathing, nonlabored ventilation, respiratory function stable and patient connected to nasal cannula oxygen Cardiovascular status: blood pressure returned to baseline and stable Postop Assessment: no apparent nausea or vomiting Anesthetic complications: no   No complications documented.  Last Vitals:  Vitals:   10/17/20 1045 10/17/20 1047  BP:  138/66  Pulse: 71 72  Resp: 11 12  Temp:  36.7 C  SpO2: 98% 97%    Last Pain:  Vitals:   10/17/20 1045  TempSrc:   PainSc: 0-No pain                 Yocheved Depner

## 2020-10-18 ENCOUNTER — Encounter (HOSPITAL_COMMUNITY): Payer: Self-pay | Admitting: Otolaryngology

## 2020-10-20 LAB — SURGICAL PATHOLOGY

## 2020-11-06 ENCOUNTER — Other Ambulatory Visit: Payer: Self-pay | Admitting: Family Medicine

## 2021-01-29 ENCOUNTER — Other Ambulatory Visit: Payer: Self-pay | Admitting: Family Medicine

## 2021-02-24 ENCOUNTER — Other Ambulatory Visit: Payer: Self-pay | Admitting: Family Medicine

## 2021-03-11 ENCOUNTER — Other Ambulatory Visit: Payer: Self-pay | Admitting: Cardiology

## 2021-04-07 ENCOUNTER — Other Ambulatory Visit: Payer: Self-pay | Admitting: Cardiology

## 2021-05-12 ENCOUNTER — Other Ambulatory Visit: Payer: Self-pay | Admitting: Cardiology

## 2021-05-22 ENCOUNTER — Ambulatory Visit: Payer: Medicare Other

## 2021-05-27 ENCOUNTER — Other Ambulatory Visit: Payer: Self-pay | Admitting: Cardiology

## 2021-09-30 ENCOUNTER — Other Ambulatory Visit: Payer: Self-pay | Admitting: Family Medicine

## 2021-09-30 DIAGNOSIS — E1159 Type 2 diabetes mellitus with other circulatory complications: Secondary | ICD-10-CM

## 2022-03-18 ENCOUNTER — Other Ambulatory Visit: Payer: Self-pay | Admitting: Cardiology

## 2022-05-29 ENCOUNTER — Other Ambulatory Visit: Payer: Self-pay | Admitting: Nurse Practitioner

## 2022-05-29 DIAGNOSIS — E538 Deficiency of other specified B group vitamins: Secondary | ICD-10-CM

## 2022-09-20 ENCOUNTER — Other Ambulatory Visit: Payer: Self-pay | Admitting: Nurse Practitioner

## 2022-09-20 DIAGNOSIS — I152 Hypertension secondary to endocrine disorders: Secondary | ICD-10-CM

## 2024-04-02 ENCOUNTER — Observation Stay (HOSPITAL_COMMUNITY)
Admission: EM | Admit: 2024-04-02 | Discharge: 2024-04-05 | Disposition: A | Discharge status: Home Health | Attending: Family Medicine | Admitting: Family Medicine

## 2024-04-02 ENCOUNTER — Encounter (HOSPITAL_COMMUNITY): Payer: Self-pay | Admitting: Emergency Medicine

## 2024-04-02 ENCOUNTER — Other Ambulatory Visit: Payer: Self-pay

## 2024-04-02 ENCOUNTER — Emergency Department (HOSPITAL_COMMUNITY)

## 2024-04-02 DIAGNOSIS — Z7902 Long term (current) use of antithrombotics/antiplatelets: Secondary | ICD-10-CM | POA: Diagnosis not present

## 2024-04-02 DIAGNOSIS — Z79899 Other long term (current) drug therapy: Secondary | ICD-10-CM | POA: Insufficient documentation

## 2024-04-02 DIAGNOSIS — R531 Weakness: Secondary | ICD-10-CM | POA: Diagnosis not present

## 2024-04-02 DIAGNOSIS — L89152 Pressure ulcer of sacral region, stage 2: Secondary | ICD-10-CM | POA: Insufficient documentation

## 2024-04-02 DIAGNOSIS — E43 Unspecified severe protein-calorie malnutrition: Secondary | ICD-10-CM | POA: Diagnosis not present

## 2024-04-02 DIAGNOSIS — R627 Adult failure to thrive: Secondary | ICD-10-CM | POA: Diagnosis not present

## 2024-04-02 DIAGNOSIS — R770 Abnormality of albumin: Secondary | ICD-10-CM | POA: Insufficient documentation

## 2024-04-02 DIAGNOSIS — G9341 Metabolic encephalopathy: Secondary | ICD-10-CM | POA: Insufficient documentation

## 2024-04-02 DIAGNOSIS — Z8673 Personal history of transient ischemic attack (TIA), and cerebral infarction without residual deficits: Secondary | ICD-10-CM | POA: Insufficient documentation

## 2024-04-02 DIAGNOSIS — E1165 Type 2 diabetes mellitus with hyperglycemia: Secondary | ICD-10-CM | POA: Diagnosis not present

## 2024-04-02 DIAGNOSIS — Z794 Long term (current) use of insulin: Secondary | ICD-10-CM | POA: Insufficient documentation

## 2024-04-02 DIAGNOSIS — Z7984 Long term (current) use of oral hypoglycemic drugs: Secondary | ICD-10-CM | POA: Insufficient documentation

## 2024-04-02 DIAGNOSIS — N390 Urinary tract infection, site not specified: Secondary | ICD-10-CM | POA: Diagnosis not present

## 2024-04-02 DIAGNOSIS — E46 Unspecified protein-calorie malnutrition: Secondary | ICD-10-CM

## 2024-04-02 DIAGNOSIS — Z853 Personal history of malignant neoplasm of breast: Secondary | ICD-10-CM | POA: Insufficient documentation

## 2024-04-02 DIAGNOSIS — E8809 Other disorders of plasma-protein metabolism, not elsewhere classified: Secondary | ICD-10-CM | POA: Insufficient documentation

## 2024-04-02 DIAGNOSIS — T83511A Infection and inflammatory reaction due to indwelling urethral catheter, initial encounter: Secondary | ICD-10-CM

## 2024-04-02 DIAGNOSIS — Z951 Presence of aortocoronary bypass graft: Secondary | ICD-10-CM | POA: Insufficient documentation

## 2024-04-02 DIAGNOSIS — E782 Mixed hyperlipidemia: Secondary | ICD-10-CM | POA: Diagnosis not present

## 2024-04-02 DIAGNOSIS — Z7985 Long-term (current) use of injectable non-insulin antidiabetic drugs: Secondary | ICD-10-CM | POA: Diagnosis not present

## 2024-04-02 DIAGNOSIS — I10 Essential (primary) hypertension: Secondary | ICD-10-CM | POA: Diagnosis present

## 2024-04-02 DIAGNOSIS — R4182 Altered mental status, unspecified: Secondary | ICD-10-CM | POA: Diagnosis not present

## 2024-04-02 DIAGNOSIS — I2510 Atherosclerotic heart disease of native coronary artery without angina pectoris: Secondary | ICD-10-CM | POA: Diagnosis not present

## 2024-04-02 DIAGNOSIS — I25810 Atherosclerosis of coronary artery bypass graft(s) without angina pectoris: Secondary | ICD-10-CM | POA: Diagnosis present

## 2024-04-02 LAB — LACTIC ACID, PLASMA
Lactic Acid, Venous: 1.8 mmol/L (ref 0.5–1.9)
Lactic Acid, Venous: 1.9 mmol/L (ref 0.5–1.9)

## 2024-04-02 LAB — URINALYSIS, MICROSCOPIC (REFLEX)

## 2024-04-02 LAB — CBC WITH DIFFERENTIAL/PLATELET
Abs Immature Granulocytes: 0.13 10*3/uL — ABNORMAL HIGH (ref 0.00–0.07)
Basophils Absolute: 0.1 10*3/uL (ref 0.0–0.1)
Basophils Relative: 1 %
Eosinophils Absolute: 0 10*3/uL (ref 0.0–0.5)
Eosinophils Relative: 0 %
HCT: 33.3 % — ABNORMAL LOW (ref 36.0–46.0)
Hemoglobin: 10.1 g/dL — ABNORMAL LOW (ref 12.0–15.0)
Immature Granulocytes: 2 %
Lymphocytes Relative: 13 %
Lymphs Abs: 0.9 10*3/uL (ref 0.7–4.0)
MCH: 28.2 pg (ref 26.0–34.0)
MCHC: 30.3 g/dL (ref 30.0–36.0)
MCV: 93 fL (ref 80.0–100.0)
Monocytes Absolute: 0.3 10*3/uL (ref 0.1–1.0)
Monocytes Relative: 5 %
Neutro Abs: 5.3 10*3/uL (ref 1.7–7.7)
Neutrophils Relative %: 79 %
Platelets: 358 10*3/uL (ref 150–400)
RBC: 3.58 MIL/uL — ABNORMAL LOW (ref 3.87–5.11)
RDW: 19.6 % — ABNORMAL HIGH (ref 11.5–15.5)
WBC: 6.7 10*3/uL (ref 4.0–10.5)
nRBC: 0 % (ref 0.0–0.2)

## 2024-04-02 LAB — URINALYSIS, ROUTINE W REFLEX MICROSCOPIC
Bilirubin Urine: NEGATIVE
Glucose, UA: NEGATIVE mg/dL
Ketones, ur: 40 mg/dL — AB
Nitrite: POSITIVE — AB
Protein, ur: 30 mg/dL — AB
Specific Gravity, Urine: >1.03 — ABNORMAL HIGH (ref 1.005–1.030)
pH: 6 (ref 5.0–8.0)

## 2024-04-02 LAB — COMPREHENSIVE METABOLIC PANEL WITH GFR
ALT: 10 U/L (ref 0–44)
AST: 11 U/L — ABNORMAL LOW (ref 15–41)
Albumin: 2 g/dL — ABNORMAL LOW (ref 3.5–5.0)
Alkaline Phosphatase: 127 U/L — ABNORMAL HIGH (ref 38–126)
Anion gap: 9 (ref 5–15)
BUN: 20 mg/dL (ref 8–23)
CO2: 26 mmol/L (ref 22–32)
Calcium: 11.1 mg/dL — ABNORMAL HIGH (ref 8.9–10.3)
Chloride: 99 mmol/L (ref 98–111)
Creatinine, Ser: 0.65 mg/dL (ref 0.44–1.00)
GFR, Estimated: >60 mL/min (ref >60)
Glucose, Bld: 235 mg/dL — ABNORMAL HIGH (ref 70–99)
Potassium: 4 mmol/L (ref 3.5–5.1)
Sodium: 134 mmol/L — ABNORMAL LOW (ref 135–145)
Total Bilirubin: 0.7 mg/dL (ref 0.0–1.2)
Total Protein: 5.5 g/dL — ABNORMAL LOW (ref 6.5–8.1)

## 2024-04-02 LAB — CBG MONITORING, ED: Glucose-Capillary: 228 mg/dL — ABNORMAL HIGH (ref 70–99)

## 2024-04-02 MED ORDER — GLUCERNA SHAKE PO LIQD
237.0000 mL | Freq: Three times a day (TID) | ORAL | Status: DC
  Administered 2024-04-03 – 2024-04-05 (×7): 237 mL via ORAL

## 2024-04-02 MED ORDER — LACTATED RINGERS IV SOLN: INTRAVENOUS | Status: AC

## 2024-04-02 MED ORDER — ENOXAPARIN SODIUM 40 MG/0.4ML IJ SOSY
40.0000 mg | PREFILLED_SYRINGE | INTRAMUSCULAR | Status: DC
  Administered 2024-04-03 – 2024-04-05 (×3): 40 mg via SUBCUTANEOUS
  Filled 2024-04-02 (×3): qty 0.4

## 2024-04-02 MED ORDER — INSULIN ASPART 100 UNIT/ML IJ SOLN
0.0000 [IU] | Freq: Three times a day (TID) | INTRAMUSCULAR | Status: DC
  Administered 2024-04-03: 5 [IU] via SUBCUTANEOUS
  Administered 2024-04-03: 7 [IU] via SUBCUTANEOUS
  Administered 2024-04-03: 5 [IU] via SUBCUTANEOUS
  Administered 2024-04-04 (×2): 2 [IU] via SUBCUTANEOUS
  Administered 2024-04-04: 3 [IU] via SUBCUTANEOUS

## 2024-04-02 MED ORDER — ONDANSETRON HCL 4 MG/2ML IJ SOLN
4.0000 mg | Freq: Four times a day (QID) | INTRAMUSCULAR | Status: DC | PRN
Start: 2024-04-02 — End: 2024-04-05

## 2024-04-02 MED ORDER — SODIUM CHLORIDE 0.9 % IV SOLN
1.0000 g | INTRAVENOUS | Status: DC
  Administered 2024-04-03 – 2024-04-04 (×2): 1 g via INTRAVENOUS
  Filled 2024-04-02 (×2): qty 10

## 2024-04-02 MED ORDER — ACETAMINOPHEN 325 MG PO TABS
650.0000 mg | ORAL_TABLET | Freq: Four times a day (QID) | ORAL | Status: DC | PRN
  Administered 2024-04-05: 650 mg via ORAL
  Filled 2024-04-02: qty 2

## 2024-04-02 MED ORDER — SODIUM CHLORIDE 0.9 % IV SOLN
1.0000 g | Freq: Once | INTRAVENOUS | Status: AC
  Administered 2024-04-02: 1 g via INTRAVENOUS
  Filled 2024-04-02: qty 10

## 2024-04-02 NOTE — ED Notes (Signed)
 Lab at bedside

## 2024-04-02 NOTE — ED Notes (Signed)
 RN certified in US  IV at bedside attempting IV access

## 2024-04-02 NOTE — ED Provider Notes (Signed)
 [facility] AT [facility] Provider Note   CSN: [id number] Arrival date & time: 04/02/24  1731     History  Chief Complaint  Patient presents with   Weakness    [name] is a 73 y.o. female.  Patient with history of stroke with residual left sided weakness, CAD, diabetes, hypertension, bladder prolapse with indwelling foley catheter presents today with complaints of weakness, altered mental status. Family states that same has been progressively worsening for the past few weeks but worsened acutely when she woke up this morning. Daughters at bedside state she has become less responsive today. She has not had any complaints. She has been generally deconditioning for the past few months since she was admitted for urosepsis in March 2025 and then again for a GI bleed in April 2025. Prior to these admissions she was ambulatory and now she is only able to stand to transfer. Additionally, of note patient's daughter states that when she was admitted for GI bleed, she was essentially stopped on all of her medications including Plavix , metoprolol , metformin , lasix . Patients daughters are concerned for UTI as this is similar presentation to when she was admitted previously with a UTI. They also note that she has not been eating very much of late. She did eat a banana for dinner last night. Patient has no complaints, will respond to some questions but is difficult to arouse. Of note, patient lives with her daughters who are her caregivers. Of note, patient's family mentions that her foley has not been exchanged in 1 month. They would like to discuss not having the foley replaced as it was present for bladder prolapse and now that she is bedbound her bladder no longer prolapses.  The history is provided by the patient. No language interpreter was used.  Weakness      Home Medications Prior to Admission medications   Medication Sig Start Date End Date Taking?  Authorizing Provider  B-D 3CC LUER-LOK SYR 25GX1" 25G X 1" 3 ML MISC USE TO INJECT B12 INTRAMUSCULARLY AS DIRECTED 08/11/20   Vicky Grange M, DO  blood glucose meter kit and supplies KIT Dispense based on patient and insurance preference. Use up to four times daily as directed. (DxE11.9/E11.59) 11/28/18   Eliodoro Guerin, DO  Cholecalciferol (VITAMIN D ) 2000 units CAPS Take 1 capsule (2,000 Units total) by mouth daily. 05/30/18   Eliodoro Guerin, DO  clopidogrel  (PLAVIX ) 75 MG tablet Take 1 tablet (75 mg total) by mouth daily. (NEEDS TO BE SEEN BEFORE NEXT REFILL) 01/29/21   Vicky Grange M, DO  cyanocobalamin  (,VITAMIN B-12,) 1000 MCG/ML injection Inject 1 mL intramuscularly every week for 4 weeks.  Then inject 1 mL intramuscularly every 28 days. Patient taking differently: Inject 1,000 mcg into the muscle every 28 (twenty-eight) days.  04/02/20   Eliodoro Guerin, DO  DULoxetine  (CYMBALTA ) 30 MG capsule TAKE 1 CAPSULE BY MOUTH EVERY DAY Patient taking differently: Take 30 mg by mouth daily.  03/21/20   Eliodoro Guerin, DO  folic acid  (FOLVITE ) 1 MG tablet Take 1 tablet (1 mg total) by mouth daily. (NEEDS TO BE SEEN BEFORE NEXT REFILL) 01/29/21   Eliodoro Guerin, DO  gabapentin  (NEURONTIN ) 400 MG capsule Take 1 capsule (400 mg total) by mouth 3 (three) times daily. (Needs to be seen before next refill) 08/11/20   Vicky Grange M, DO  glucose blood test strip Test blood sugar up to 4 times daily. Dx E11.9/ E11.59.  Meter/ strips per insurance/patient preference. 11/28/18   Eliodoro Guerin, DO  hydrochlorothiazide  (MICROZIDE ) 12.5 MG capsule TAKE 1 CAPSULE BY MOUTH ONCE DAILY. 11/11/22   Whitfield Hamper, NP  insulin  aspart (NOVOLOG  FLEXPEN) 100 UNIT/ML FlexPen INJECT 4 UNITS INTO THE SKIN 2 (TWO) TIMES DAILY WITH A MEAL. Patient taking differently: Inject 0-6 Units into the skin 4 (four) times daily as needed for high blood sugar.  07/16/20   Eliodoro Guerin, DO  Lancets  MISC Use to test blood sugar up to 4 times daily (E11.9/E11.59) 11/28/18   Eliodoro Guerin, DO  lisinopril  (ZESTRIL ) 20 MG tablet Take 1 tablet (20 mg total) by mouth daily. Patient taking differently: Take 10 mg by mouth daily.  03/21/20   Eliodoro Guerin, DO  magic mouthwash w/lidocaine  SOLN Gargle and spit 10mL every 6 hours as needed for sore throat Patient not taking: Reported on 05/21/2020 03/21/20   Eliodoro Guerin, DO  metFORMIN  (GLUCOPHAGE ) 1000 MG tablet Take 1 tablet (1,000 mg total) by mouth 2 (two) times daily. (Needs to be seen before next refill) Patient taking differently: Take 1,000 mg by mouth 2 (two) times daily.  09/08/20   Eliodoro Guerin, DO  metoprolol  tartrate (LOPRESSOR ) 25 MG tablet TAKE 1/2 TABLET (12.5 MG TOTAL) BY MOUTH 2 (TWO) TIMES DAILY. 03/19/22   Laurann Pollock, MD  oxyCODONE  (ROXICODONE ) 15 MG immediate release tablet Take 1 tablet by mouth 4 (four) times daily as needed for pain.  06/22/19   [provider]  pravastatin  (PRAVACHOL ) 40 MG tablet TAKE 1 TABLET BY MOUTH EVERY DAY Patient taking differently: Take 40 mg by mouth daily.  06/11/20   Eliodoro Guerin, DO  Semaglutide (OZEMPIC, 0.25 OR 0.5 MG/DOSE, [place]) Inject 0.5 mg into the skin every 7 (seven) days.    [provider]      Allergies    Patient has no known allergies.    Review of Systems   Review of Systems  Neurological:  Positive for weakness.  All other systems reviewed and are negative.   Physical Exam Updated Vital Signs BP 111/72 (BP Location: Right Arm)   Pulse (!) 103   Temp (!) 97.5 F (36.4 C) (Oral)   Resp 18   Ht 5\' 4"  (1.626 m)   Wt 55 kg   SpO2 97%   BMI 20.81 kg/m  Physical Exam Vitals and nursing note reviewed.  Constitutional:      General: She is not in acute distress.    Appearance: She is ill-appearing. She is not diaphoretic.     Comments: Cachectic appearing  HENT:     Head: Normocephalic and atraumatic.  Cardiovascular:      Rate and Rhythm: Normal rate.  Pulmonary:     Effort: Pulmonary effort is normal. No respiratory distress.  Abdominal:     General: Abdomen is flat.     Palpations: Abdomen is soft.     Tenderness: There is no abdominal tenderness.  Genitourinary:    Comments: Foley catheter in place with dark cloudy appearing urine in bag Musculoskeletal:        General: Normal range of motion.     Cervical back: Normal range of motion.  Skin:    General: Skin is warm and dry.  Neurological:     General: No focal deficit present.     Comments: Patient slow to respond to all questions, will answer some questions. Tells me her name and date of birth but does  not respond when asked the year or place. Will follow some commands and move all extremities. Is weaker on the left but this is baseline.  PERRLA and EOMs intact. Facial movements symmetric.   Psychiatric:        Mood and Affect: Mood normal.        Behavior: Behavior normal.     ED Results / Procedures / Treatments   Labs (all labs ordered are listed, but only abnormal results are displayed) Labs Reviewed  COMPREHENSIVE METABOLIC PANEL WITH GFR - Abnormal; Notable for the following components:      Result Value   Sodium 134 (*)    Glucose, Bld 235 (*)    Calcium  11.1 (*)    Total Protein 5.5 (*)    Albumin 2.0 (*)    AST 11 (*)    Alkaline Phosphatase 127 (*)    All other components within normal limits  CBC WITH DIFFERENTIAL/PLATELET - Abnormal; Notable for the following components:   RBC 3.58 (*)    Hemoglobin 10.1 (*)    HCT 33.3 (*)    RDW 19.6 (*)    Abs Immature Granulocytes 0.13 (*)    All other components within normal limits  URINALYSIS, ROUTINE W REFLEX MICROSCOPIC - Abnormal; Notable for the following components:   APPearance HAZY (*)    Specific Gravity, Urine >1.030 (*)    Hgb urine dipstick LARGE (*)    Ketones, ur 40 (*)    Protein, ur 30 (*)    Nitrite POSITIVE (*)    Leukocytes,Ua SMALL (*)    All other  components within normal limits  URINALYSIS, MICROSCOPIC (REFLEX) - Abnormal; Notable for the following components:   Bacteria, UA MANY (*)    All other components within normal limits  CBG MONITORING, ED - Abnormal; Notable for the following components:   Glucose-Capillary 228 (*)    All other components within normal limits  CULTURE, BLOOD (ROUTINE X 2)  CULTURE, BLOOD (ROUTINE X 2)  URINE CULTURE  LACTIC ACID, PLASMA  LACTIC ACID, PLASMA    EKG EKG Interpretation Date/Time:  Monday Apr 02 2024 17:58:48 EDT Ventricular Rate:  102 PR Interval:  168 QRS Duration:  129 QT Interval:  353 QTC Calculation: 460 R Axis:   93  Text Interpretation: Sinus tachycardia RBBB and LPFB Repol abnrm suggests ischemia, lateral leads Since last tracing rate faster Confirmed by Early Glisson ([id number]) on 04/02/2024 6:12:44 PM  Radiology DG Chest Port 1 View Result Date: 04/02/2024 CLINICAL DATA:  Altered mental status EXAM: PORTABLE CHEST 1 VIEW COMPARISON:  09/22/2022 FINDINGS: Post sternotomy changes. Minimal atelectasis left base. Normal cardiac size. No pleural effusion or pneumothorax IMPRESSION: Minimal atelectasis at the left base. Electronically Signed   By: Esmeralda Hedge M.D.   On: 04/02/2024 19:15   CT HEAD WO CONTRAST Result Date: 04/02/2024 CLINICAL DATA:  Mental status change weakness and lethargy EXAM: CT HEAD WITHOUT CONTRAST TECHNIQUE: Contiguous axial images were obtained from the base of the skull through the vertex without intravenous contrast. RADIATION DOSE REDUCTION: This exam was performed according to the departmental dose-optimization program which includes automated exposure control, adjustment of the mA and/or kV according to patient size and/or use of iterative reconstruction technique. COMPARISON:  CT brain 05/12/2018 report FINDINGS: Brain: No acute territorial infarction, hemorrhage or intracranial mass. Mild chronic small vessel ischemic changes of the white matter. Chronic  appearing infarct within the left ganglial capsular region and white matter. Nonenlarged ventricles Vascular: No hyperdense vessels.  Carotid vascular calcification Skull: Normal. Negative for fracture or focal lesion. Sinuses/Orbits: No acute finding. Other: None IMPRESSION: 1. No CT evidence for acute intracranial abnormality. 2. Mild chronic small vessel ischemic changes of the white matter. 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 Electronically Signed   By: Esmeralda Hedge M.D.   On: 04/02/2024 19:15    Procedures Procedures    Medications Ordered in ED Medications  cefTRIAXone  (ROCEPHIN ) 1 g in sodium chloride  0.9 % 100 mL IVPB (has no administration in time range)    ED Course/ Medical Decision Making/ A&P                                 Medical Decision Making Amount and/or Complexity of Data Reviewed Labs: ordered. Radiology: ordered.   This patient is a 73 y.o. female who presents to the ED for concern of AMS, weakness, this involves an extensive number of treatment options, and is a complaint that carries with it a high risk of complications and morbidity. The emergent differential diagnosis prior to evaluation includes, but is not limited to,  Drug-related, hypoxia, hyper/hypoglycemia, encephalopathy, sepsis, DKA/HHS, brain lesion, CVA, seizure, environmental, psychiatric, electrolyte disturbance, dehydration  This is not an exhaustive differential.   Past Medical History / Co-morbidities / Social History:  has a past medical history of Anxiety, Arthritis, Cancer (HCC), Carotid disease, bilateral (HCC), Carotid disease, bilateral (HCC) (10/21/2011), Coronary artery disease, Diabetes mellitus, Essential hypertension, benign (07/31/2009), History of kidney stones, HLD (hyperlipidemia), Hypertension, Neuromuscular disorder (HCC), Neuropathic pain, leg, Stroke (HCC), Stroke, lacunar (HCC), and T2DM (type 2 diabetes mellitus) (HCC)  (03/23/2018).  Additional history: Chart reviewed. Pertinent results include: admitted in March for urosepsis, admitted for GI bleed on 4/15  Daughters at bedside state she had foley catheter placed due to bladder prolapse, however now that she is essentially bedbound her bladder is no longer prolapsed  Physical Exam: Physical exam performed. The pertinent findings include: per above, responds to some questions, tells me her name and the year. Follows some commands, weaker on the left per baseline.  Lab Tests: I ordered, and personally interpreted labs.  The pertinent results include:  Hgb 10.1, Na 134, glucose 235, calcium  11.1, total protein 5.5, albumin 2.0. UA infectious, nitrite positive. Lactic WNL   Imaging Studies: I ordered imaging studies including CXR, CT head. I independently visualized and interpreted imaging which showed   CXR: Minimal atelectasis at the left base.   CT:  1. No CT evidence for acute intracranial abnormality. 2. Mild chronic small vessel ischemic changes of the white matter. 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  I agree with the radiologist interpretation.   Cardiac Monitoring:  The patient was maintained on a cardiac monitor.  My attending physician Dr. Annabell Key viewed and interpreted the cardiac monitored which showed an underlying rhythm of: sinus tachycardia, no STEMI. I agree with this interpretation.   Medications: I ordered medication including Rocephin   for UTI. Reevaluation of the patient after these medicines showed that the patient improved. I have reviewed the patients home medicines and have made adjustments as needed.  Disposition: After consideration of the diagnostic results and the patients response to treatment, I feel that patient will require admission for weakness, altered mental status presumably in the setting of UTI.  Discussed same with patient and family who are understanding and  agreement.  Discussed patient  with hospitalist Dr. Adefeso who accepts patient for admission.  I discussed this case with my attending physician Dr. Annabell Key who cosigned this note including patient's presenting symptoms, physical exam, and planned diagnostics and interventions. Attending physician stated agreement with plan or made changes to plan which were implemented.    Final Clinical Impression(s) / ED Diagnoses Final diagnoses:  Generalized weakness  Urinary tract infection associated with indwelling urethral catheter, initial encounter (HCC)  Altered mental status, unspecified altered mental status type    Rx / DC Orders ED Discharge Orders     None         Fredna Jasper 04/02/24 2203    Early Glisson, MD 04/03/24 [phone]

## 2024-04-02 NOTE — ED Notes (Signed)
 Per provider, foley cath will stay in until morning.

## 2024-04-02 NOTE — ED Notes (Addendum)
 Unable to obtain blood when US  guided IV obtained. Lab notified blood samples still need. Pt's daughter Lane Pinon is a nurse that works for oncology and states left arm may be used due to length of time since mastectomy. Daughter states it's been over 10 years and lymph nodes not removed.

## 2024-04-02 NOTE — ED Notes (Signed)
 Provider at bedside

## 2024-04-02 NOTE — ED Notes (Signed)
 Per pt's daughter, it's been 10+ years since breast cancer with no lymph node removal. Safe to use left arm for BP

## 2024-04-02 NOTE — ED Notes (Signed)
EDPA Provider at bedside. 

## 2024-04-02 NOTE — ED Notes (Signed)
 Family updated as to patient's status.

## 2024-04-02 NOTE — H&P (Signed)
 History and Physical    Patient: [name] NWG:[id number] DOB: 09-10-1951 DOA: 04/02/2024 DOS: the patient was seen and examined on 04/02/2024 PCP: Laurann Pollock, MD  Patient coming from: Home  Chief Complaint:  Chief Complaint  Patient presents with   Weakness   HPI: [name] is a 73 y.o. female with medical history significant of hypertension, hyperlipidemia, T2DM, CAD s/p CABG, bladder prolapse with indwelling Foley catheter who presents to the emergency department from home via EMS due to generalized weakness and altered mental status.  At bedside, patient was unable to provide much history other than she has been feeling weak for a few weeks.  Rest of the history was obtained from ED PA and ED medical record.  Per report, patient has been having worsening weakness for few weeks and this rapidly worsening this morning due to being less responsive.  Patient was ambulatory after 2 months ago (prior to admission for urosepsis in March 2025 and GI bleed last month- April).  Family was concerned for UTI and also for decreased appetite.  She lives at home with daughters (caregivers).  Foley was placed about a month ago and family does not want Foley replaced on removal of current Foley.  Patient is bedbound  ED Course:  In the emergency department, temperature was 90 7.43F, she was tachycardic, but other vital signs were within normal range.  Workup in the ED showed normocytic anemia, BMP was normal except for sodium of 134, blood glucose 235, ALT calcium  11.1, albumin 2.0, ALP 127.  Lactic acid 1.9, urinalysis was positive for UTI.  Blood culture pending. Chest x-ray showed minimal atelectasis at the left base CT head without contrast showed no acute intracranial abnormality Patient was treated with IV ceftriaxone .  TRH was asked to admit patient.   Review of Systems: Review of systems as noted in the HPI. All other systems reviewed and are negative.   Past Medical  History:  Diagnosis Date   Anxiety    no rx   Arthritis    Cancer (HCC)    Left mastectomy, 2/11   Carotid disease, bilateral (HCC)    Followed by Dr. Janett Medin   Carotid disease, bilateral (HCC) 10/21/2011   Coronary artery disease    cabg 2007   Diabetes mellitus    Essential hypertension, benign 07/31/2009   Qualifier: Diagnosis of  By: Micael Adas, RMA, Sherri     History of kidney stones    per patient about a year ago and it passed on its own   HLD (hyperlipidemia)    Hypertension    Neuromuscular disorder (HCC)    Neuropathic pain, leg    Stroke (HCC)    more than 10 yrs weakness rt leg   Stroke, lacunar (HCC)    Followed by Dr. Janett Medin   T2DM (type 2 diabetes mellitus) (HCC) 03/23/2018   Past Surgical History:  Procedure Laterality Date   arm fx Left 11   CATARACT EXTRACTION W/PHACO Right 09/26/2017   Procedure: CATARACT EXTRACTION PHACO AND INTRAOCULAR LENS PLACEMENT (IOC);  Surgeon: Anner Kill, MD;  Location: [facility];  Service: Ophthalmology;  Laterality: Right;  CDE: 8.52   CATARACT EXTRACTION W/PHACO Left 10/31/2017   Procedure: CATARACT EXTRACTION PHACO AND INTRAOCULAR LENS PLACEMENT LEFT EYE CDE=7.75;  Surgeon: Anner Kill, MD;  Location: [facility];  Service: Ophthalmology;  Laterality: Left;  left   CHOLECYSTECTOMY     per patient "more than 10 years ago"   CORONARY ARTERY BYPASS GRAFT  07  EYE SURGERY Right    cateracts   FRACTURE SURGERY Bilateral    bilateral wrist and right knee orif's   FRONTAL SINUS EXPLORATION Left 10/17/2020   Procedure: FRONTAL SINUS EXPLORATION;  Surgeon: Reynold Caves, MD;  Location: [facility];  Service: ENT;  Laterality: Left;   INSERTION OF MESH N/A 04/05/2016   Procedure: INSERTION OF MESH;  Surgeon: Enid Harry, MD;  Location: [facility] OR;  Service: General;  Laterality: N/A;   MASTECTOMY  2010   lt-nodes   MASTECTOMY Left    per patient "2011"   ORIF ULNAR FRACTURE  10/01/2011   Procedure: OPEN REDUCTION INTERNAL FIXATION (ORIF) ULNAR FRACTURE;   Surgeon: Ilean Mall;  Location: [facility] OR;  Service: Orthopedics;  Laterality: Right;  and orif scaphoid   SEPTOPLASTY WITH ETHMOIDECTOMY, AND MAXILLARY ANTROSTOMY Left 10/17/2020   Procedure: SEPTOPLASTY WITH ETHMOIDECTOMY, AND MAXILLARY ANTROSTOMY;  Surgeon: Reynold Caves, MD;  Location: [facility] OR;  Service: ENT;  Laterality: Left;   SINUS ENDO WITH FUSION Left 10/17/2020   Procedure: SINUS ENDO WITH FUSION;  Surgeon: Reynold Caves, MD;  Location: [facility] OR;  Service: ENT;  Laterality: Left;   UMBILICAL HERNIA REPAIR  04/05/2016   with 6.4 cm ventrallex mesh   UMBILICAL HERNIA REPAIR N/A 04/05/2016   Procedure: UMBILICAL HERNIA REPAIR;  Surgeon: Enid Harry, MD;  Location: [facility] OR;  Service: General;  Laterality: N/A;    Social History:  reports that she has never smoked. She has never used smokeless tobacco. She reports that she does not drink alcohol and does not use drugs.   No Known Allergies  Family History  Problem Relation Age of Onset   Cancer Mother        breast    Stroke Mother    Diabetes Mother    Hypertension Mother    Heart attack Father    Diabetes Sister    Lung cancer Brother    Heart disease Brother      Prior to Admission medications   Medication Sig Start Date End Date Taking? Authorizing Provider  B-D 3CC LUER-LOK SYR 25GX1" 25G X 1" 3 ML MISC USE TO INJECT B12 INTRAMUSCULARLY AS DIRECTED 08/11/20   Vicky Grange M, DO  blood glucose meter kit and supplies KIT Dispense based on patient and insurance preference. Use up to four times daily as directed. (DxE11.9/E11.59) 11/28/18   Eliodoro Guerin, DO  Cholecalciferol (VITAMIN D ) 2000 units CAPS Take 1 capsule (2,000 Units total) by mouth daily. 05/30/18   Eliodoro Guerin, DO  clopidogrel  (PLAVIX ) 75 MG tablet Take 1 tablet (75 mg total) by mouth daily. (NEEDS TO BE SEEN BEFORE NEXT REFILL) 01/29/21   Vicky Grange M, DO  cyanocobalamin  (,VITAMIN B-12,) 1000 MCG/ML injection Inject 1 mL intramuscularly every week for 4  weeks.  Then inject 1 mL intramuscularly every 28 days. Patient taking differently: Inject 1,000 mcg into the muscle every 28 (twenty-eight) days.  04/02/20   Eliodoro Guerin, DO  DULoxetine  (CYMBALTA ) 30 MG capsule TAKE 1 CAPSULE BY MOUTH EVERY DAY Patient taking differently: Take 30 mg by mouth daily.  03/21/20   Eliodoro Guerin, DO  folic acid  (FOLVITE ) 1 MG tablet Take 1 tablet (1 mg total) by mouth daily. (NEEDS TO BE SEEN BEFORE NEXT REFILL) 01/29/21   Vicky Grange M, DO  gabapentin  (NEURONTIN ) 400 MG capsule Take 1 capsule (400 mg total) by mouth 3 (three) times daily. (Needs to be seen before next refill) 08/11/20   Eliodoro Guerin,  DO  glucose blood test strip Test blood sugar up to 4 times daily. Dx E11.9/ E11.59.  Meter/ strips per insurance/patient preference. 11/28/18   Eliodoro Guerin, DO  hydrochlorothiazide  (MICROZIDE ) 12.5 MG capsule TAKE 1 CAPSULE BY MOUTH ONCE DAILY. 11/11/22   Whitfield Hamper, NP  insulin  aspart (NOVOLOG  FLEXPEN) 100 UNIT/ML FlexPen INJECT 4 UNITS INTO THE SKIN 2 (TWO) TIMES DAILY WITH A MEAL. Patient taking differently: Inject 0-6 Units into the skin 4 (four) times daily as needed for high blood sugar.  07/16/20   Eliodoro Guerin, DO  Lancets MISC Use to test blood sugar up to 4 times daily (E11.9/E11.59) 11/28/18   Vicky Grange M, DO  lisinopril  (ZESTRIL ) 20 MG tablet Take 1 tablet (20 mg total) by mouth daily. Patient taking differently: Take 10 mg by mouth daily.  03/21/20   Eliodoro Guerin, DO  magic mouthwash w/lidocaine  SOLN Gargle and spit 10mL every 6 hours as needed for sore throat Patient not taking: Reported on 05/21/2020 03/21/20   Eliodoro Guerin, DO  metFORMIN  (GLUCOPHAGE ) 1000 MG tablet Take 1 tablet (1,000 mg total) by mouth 2 (two) times daily. (Needs to be seen before next refill) Patient taking differently: Take 1,000 mg by mouth 2 (two) times daily.  09/08/20   Eliodoro Guerin, DO  metoprolol  tartrate (LOPRESSOR )  25 MG tablet TAKE 1/2 TABLET (12.5 MG TOTAL) BY MOUTH 2 (TWO) TIMES DAILY. 03/19/22   Laurann Pollock, MD  oxyCODONE  (ROXICODONE ) 15 MG immediate release tablet Take 1 tablet by mouth 4 (four) times daily as needed for pain.  06/22/19   [provider]  pravastatin  (PRAVACHOL ) 40 MG tablet TAKE 1 TABLET BY MOUTH EVERY DAY Patient taking differently: Take 40 mg by mouth daily.  06/11/20   Eliodoro Guerin, DO  Semaglutide (OZEMPIC, 0.25 OR 0.5 MG/DOSE, Pierre Part) Inject 0.5 mg into the skin every 7 (seven) days.    [provider]    Physical Exam: BP 106/72 (BP Location: Right Arm)   Pulse (!) 105   Temp 98.2 F (36.8 C) (Oral)   Resp 18   Ht 5\' 4"  (1.626 m)   Wt 48.5 kg   SpO2 98%   BMI 18.35 kg/m   General: 73 y.o. year-old female ill appearing and cachectic, but in no acute distress.   HEENT: NCAT, EOMI Neck: Supple, trachea medial Cardiovascular: Regular rate and rhythm with no rubs or gallops.  No thyromegaly or JVD noted.  +2 bilateral lower extremity edema. 2/4 pulses in all 4 extremities. Respiratory: Clear to auscultation with no wheezes or rales. Good inspiratory effort. Abdomen: Soft, nontender nondistended with normal bowel sounds x4 quadrants. Muskuloskeletal: No cyanosis, clubbing noted bilaterally Neuro: CN II-XII intact, sensation, reflexes intact Skin: No ulcerative lesions noted or rashes Psychiatry: Mood is appropriate for condition and setting          Labs on Admission:  Basic Metabolic Panel: Recent Labs  Lab 04/02/24 1856  NA 134*  K 4.0  CL 99  CO2 26  GLUCOSE 235*  BUN 20  CREATININE 0.65  CALCIUM  11.1*   Liver Function Tests: Recent Labs  Lab 04/02/24 1856  AST 11*  ALT 10  ALKPHOS 127*  BILITOT 0.7  PROT 5.5*  ALBUMIN 2.0*   No results for input(s): "LIPASE", "AMYLASE" in the last 168 hours. No results for input(s): "AMMONIA" in the last 168 hours. CBC: Recent Labs  Lab 04/02/24 1856  WBC 6.7  NEUTROABS 5.3  HGB  10.1*  HCT 33.3*  MCV 93.0  PLT 358   Cardiac Enzymes: No results for input(s): "CKTOTAL", "CKMB", "CKMBINDEX", "TROPONINI" in the last 168 hours.  BNP (last 3 results) No results for input(s): "BNP" in the last 8760 hours.  ProBNP (last 3 results) No results for input(s): "PROBNP" in the last 8760 hours.  CBG: Recent Labs  Lab 04/02/24 1922  GLUCAP 228*    Radiological Exams on Admission: DG Chest Port 1 View Result Date: 04/02/2024 CLINICAL DATA:  Altered mental status EXAM: PORTABLE CHEST 1 VIEW COMPARISON:  09/22/2022 FINDINGS: Post sternotomy changes. Minimal atelectasis left base. Normal cardiac size. No pleural effusion or pneumothorax IMPRESSION: Minimal atelectasis at the left base. Electronically Signed   By: Esmeralda Hedge M.D.   On: 04/02/2024 19:15   CT HEAD WO CONTRAST Result Date: 04/02/2024 CLINICAL DATA:  Mental status change weakness and lethargy EXAM: CT HEAD WITHOUT CONTRAST TECHNIQUE: Contiguous axial images were obtained from the base of the skull through the vertex without intravenous contrast. RADIATION DOSE REDUCTION: This exam was performed according to the departmental dose-optimization program which includes automated exposure control, adjustment of the mA and/or kV according to patient size and/or use of iterative reconstruction technique. COMPARISON:  CT brain 05/12/2018 report FINDINGS: Brain: No acute territorial infarction, hemorrhage or intracranial mass. Mild chronic small vessel ischemic changes of the white matter. 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 Nonenlarged ventricles Vascular: No hyperdense vessels.  Carotid vascular calcification Skull: Normal. Negative for fracture or focal lesion. Sinuses/Orbits: No acute finding. Other: None IMPRESSION: 1. No CT evidence for acute intracranial abnormality. 2. Mild chronic small vessel ischemic changes of the white matter. Chronic appearing infarct within the left  ganglial capsular region and white matter. Electronically Signed   By: Esmeralda Hedge M.D.   On: 04/02/2024 19:15    EKG: I independently viewed the EKG done and my findings are as followed: Sinus tachycardia at a rate of 102 bpm with RBBB and LPFB.  Assessment/Plan Present on Admission:  UTI (urinary tract infection)  CAD, ARTERY BYPASS GRAFT  Essential hypertension  Mixed hyperlipidemia  Principal Problem:   UTI (urinary tract infection) Active Problems:   Mixed hyperlipidemia   CAD, ARTERY BYPASS GRAFT   Essential hypertension   Hypercalcemia   Altered mental status   Generalized weakness   Failure to thrive in adult   Hypoalbuminemia due to protein-calorie malnutrition (HCC)   Type 2 diabetes mellitus with hyperglycemia (HCC)  UTI POA Patient was started on IV ceftriaxone , we shall continue same at this time Blood culture and urine culture pending  Hypercalcemia Calcium  11.1, corrected calcium  level based on albumin of 2.0 is 12.7 No known cause of this at this time, though HCTZ may be a contributing factor; this will be held at this time. Continue IV hydration PTH level will be checked  Altered mental status-improved Alert and oriented x 2 -person and place Slow to respond to questions  Generalized weakness Failure to thrive in adult Hypoalbuminemia possible secondary to moderate protein calorie malnutrition Albumin 2.0, protein supplement will be provided Dietitian will be consulted and we shall await further recommendations Continue fall precaution Continue PT/OT eval and treat  T2DM with hyperglycemia Continue ISS and hypoglycemia protocol Metformin  will be held at this time  CAD s/p CABG, Mixed hyperlipidemia Continue Plavix , Pravachol   Essential hypertension Continue lisinopril , Lopressor   DVT prophylaxis: Lovenox   Code Status: Full code  Family Communication: None at bedside  Consults:  None  Severity of Illness: The appropriate patient  status for this patient is INPATIENT. Inpatient status is judged to be reasonable and necessary in order to provide the required intensity of service to ensure the patient's safety. The patient's presenting symptoms, physical exam findings, and initial radiographic and laboratory data in the context of their chronic comorbidities is felt to place them at high risk for further clinical deterioration. Furthermore, it is not anticipated that the patient will be medically stable for discharge from the hospital within 2 midnights of admission.   * I certify that at the point of admission it is my clinical judgment that the patient will require inpatient hospital care spanning beyond 2 midnights from the point of admission due to high intensity of service, high risk for further deterioration and high frequency of surveillance required.*  Author: Mychael Soots, DO 04/02/2024 11:49 PM  For on call review [email or web address].

## 2024-04-02 NOTE — ED Triage Notes (Signed)
 Pt's family called ems for ?uti, generalized weakness and lethargy. Pt denies any pain, but come in with indwelling catheter.

## 2024-04-03 DIAGNOSIS — E43 Unspecified severe protein-calorie malnutrition: Secondary | ICD-10-CM | POA: Insufficient documentation

## 2024-04-03 DIAGNOSIS — T83511A Infection and inflammatory reaction due to indwelling urethral catheter, initial encounter: Secondary | ICD-10-CM

## 2024-04-03 DIAGNOSIS — E8809 Other disorders of plasma-protein metabolism, not elsewhere classified: Secondary | ICD-10-CM | POA: Diagnosis not present

## 2024-04-03 DIAGNOSIS — R531 Weakness: Secondary | ICD-10-CM | POA: Diagnosis not present

## 2024-04-03 LAB — PHOSPHORUS: Phosphorus: 2.3 mg/dL — ABNORMAL LOW (ref 2.5–4.6)

## 2024-04-03 LAB — COMPREHENSIVE METABOLIC PANEL WITH GFR
ALT: 11 U/L (ref 0–44)
AST: 11 U/L — ABNORMAL LOW (ref 15–41)
Albumin: 1.9 g/dL — ABNORMAL LOW (ref 3.5–5.0)
Alkaline Phosphatase: 120 U/L (ref 38–126)
Anion gap: 9 (ref 5–15)
BUN: 20 mg/dL (ref 8–23)
CO2: 23 mmol/L (ref 22–32)
Calcium: 10.3 mg/dL (ref 8.9–10.3)
Chloride: 101 mmol/L (ref 98–111)
Creatinine, Ser: 0.62 mg/dL (ref 0.44–1.00)
GFR, Estimated: >60 mL/min (ref >60)
Glucose, Bld: 253 mg/dL — ABNORMAL HIGH (ref 70–99)
Potassium: 4.3 mmol/L (ref 3.5–5.1)
Sodium: 133 mmol/L — ABNORMAL LOW (ref 135–145)
Total Bilirubin: 1 mg/dL (ref 0.0–1.2)
Total Protein: 5.2 g/dL — ABNORMAL LOW (ref 6.5–8.1)

## 2024-04-03 LAB — HEMOGLOBIN A1C
Hgb A1c MFr Bld: 6.3 % — ABNORMAL HIGH (ref 4.8–5.6)
Mean Plasma Glucose: 134.11 mg/dL

## 2024-04-03 LAB — MAGNESIUM: Magnesium: 1.8 mg/dL (ref 1.7–2.4)

## 2024-04-03 LAB — GLUCOSE, CAPILLARY
Glucose-Capillary: 263 mg/dL — ABNORMAL HIGH (ref 70–99)
Glucose-Capillary: 311 mg/dL — ABNORMAL HIGH (ref 70–99)
Glucose-Capillary: 266 mg/dL — ABNORMAL HIGH (ref 70–99)
Glucose-Capillary: 281 mg/dL — ABNORMAL HIGH (ref 70–99)

## 2024-04-03 LAB — CBC
HCT: 34.1 % — ABNORMAL LOW (ref 36.0–46.0)
Hemoglobin: 10.4 g/dL — ABNORMAL LOW (ref 12.0–15.0)
MCH: 28.5 pg (ref 26.0–34.0)
MCHC: 30.5 g/dL (ref 30.0–36.0)
MCV: 93.4 fL (ref 80.0–100.0)
Platelets: 326 10*3/uL (ref 150–400)
RBC: 3.65 MIL/uL — ABNORMAL LOW (ref 3.87–5.11)
RDW: 19.9 % — ABNORMAL HIGH (ref 11.5–15.5)
WBC: 6.5 10*3/uL (ref 4.0–10.5)
nRBC: 0 % (ref 0.0–0.2)

## 2024-04-03 MED ORDER — METOPROLOL TARTRATE 25 MG PO TABS
12.5000 mg | ORAL_TABLET | Freq: Two times a day (BID) | ORAL | Status: DC
  Administered 2024-04-03: 12.5 mg via ORAL
  Filled 2024-04-03: qty 1

## 2024-04-03 MED ORDER — PRAVASTATIN SODIUM 40 MG PO TABS
40.0000 mg | ORAL_TABLET | Freq: Every day | ORAL | Status: DC
  Administered 2024-04-03 – 2024-04-05 (×3): 40 mg via ORAL
  Filled 2024-04-03 (×3): qty 1

## 2024-04-03 MED ORDER — ASPIRIN 81 MG PO CHEW
81.0000 mg | CHEWABLE_TABLET | Freq: Every day | ORAL | Status: DC
  Administered 2024-04-03 – 2024-04-05 (×3): 81 mg via ORAL
  Filled 2024-04-03 (×3): qty 1

## 2024-04-03 MED ORDER — K PHOS MONO-SOD PHOS DI & MONO 155-852-130 MG PO TABS
250.0000 mg | ORAL_TABLET | Freq: Two times a day (BID) | ORAL | Status: AC
  Administered 2024-04-03 – 2024-04-04 (×4): 250 mg via ORAL
  Filled 2024-04-03 (×4): qty 1

## 2024-04-03 MED ORDER — CHLORHEXIDINE GLUCONATE CLOTH 2 % EX PADS
6.0000 | MEDICATED_PAD | Freq: Every day | CUTANEOUS | Status: DC
  Administered 2024-04-03 – 2024-04-05 (×2): 6 via TOPICAL

## 2024-04-03 MED ORDER — PANTOPRAZOLE SODIUM 40 MG PO TBEC
40.0000 mg | DELAYED_RELEASE_TABLET | Freq: Two times a day (BID) | ORAL | Status: DC
  Administered 2024-04-03 – 2024-04-05 (×4): 40 mg via ORAL
  Filled 2024-04-03 (×4): qty 1

## 2024-04-03 MED ORDER — CLOPIDOGREL BISULFATE 75 MG PO TABS
75.0000 mg | ORAL_TABLET | Freq: Every day | ORAL | Status: DC
  Administered 2024-04-03 – 2024-04-05 (×3): 75 mg via ORAL
  Filled 2024-04-03 (×3): qty 1

## 2024-04-03 MED ORDER — DULOXETINE HCL 60 MG PO CPEP
60.0000 mg | ORAL_CAPSULE | Freq: Every day | ORAL | Status: DC
  Administered 2024-04-03 – 2024-04-05 (×3): 60 mg via ORAL
  Filled 2024-04-03 (×3): qty 1

## 2024-04-03 MED ORDER — ORAL CARE MOUTH RINSE: 15.0000 mL | OROMUCOSAL | Status: DC | PRN

## 2024-04-03 MED ORDER — ADULT MULTIVITAMIN W/MINERALS CH
1.0000 | ORAL_TABLET | Freq: Every day | ORAL | Status: DC
  Administered 2024-04-03 – 2024-04-05 (×3): 1 via ORAL
  Filled 2024-04-03 (×3): qty 1

## 2024-04-03 MED ORDER — LISINOPRIL 10 MG PO TABS
5.0000 mg | ORAL_TABLET | Freq: Every day | ORAL | Status: DC
  Administered 2024-04-03: 5 mg via ORAL
  Filled 2024-04-03: qty 1

## 2024-04-03 MED ORDER — MIRTAZAPINE 15 MG PO TABS
7.5000 mg | ORAL_TABLET | Freq: Every day | ORAL | Status: DC
  Administered 2024-04-03 – 2024-04-04 (×2): 7.5 mg via ORAL
  Filled 2024-04-03 (×2): qty 1

## 2024-04-03 NOTE — NC FL2 (Signed)
 [place]  MEDICAID [id number] LEVEL OF CARE FORM     IDENTIFICATION  Patient Name: [name] Birthdate: 12-Nov-1951 Sex: female Admission Date (Current Location): 04/02/2024  [name] and [name] Number:  [name] and Address:  [name],  [address] [name] [address]      Provider Number: [id number]  Attending Physician Name and Address:  Aisha Hove, MD  Relative Name and Phone Number:  [name] ( daughter) [phone]    Current Level of Care: Mccann Recommended Level of Care: Skilled Nursing Facility Prior Approval Number:    Date Approved/Denied:   PASRR Number: pending  Discharge Plan: SNF    Current Diagnoses: Patient Active Problem List   Diagnosis Date Noted   UTI (urinary tract infection) 04/02/2024   Hypercalcemia 04/02/2024   Altered mental status 04/02/2024   Generalized weakness 04/02/2024   Failure to thrive in adult 04/02/2024   Hypoalbuminemia due to protein-calorie malnutrition (HCC) 04/02/2024   Type 2 diabetes mellitus with hyperglycemia (HCC) 04/02/2024   Essential hypertension 11/28/2018   Diabetic retinopathy (HCC) 02/17/2017   History of breast cancer 01/06/2017   Incisional hernia 04/05/2016   DM type 2 causing vascular disease (HCC) 10/21/2011   Stroke (HCC) 10/21/2011   Carotid disease, bilateral (HCC) 10/21/2011   Closed fracture of right distal radius and ulna 10/01/2011   Malignant neoplasm of female breast (HCC) 12/16/2009   RBBB 12/16/2009   Mixed hyperlipidemia 07/31/2009   CAD, ARTERY BYPASS GRAFT 07/31/2009    Orientation RESPIRATION BLADDER Height & Weight     Self, Time, Situation, Place  Normal Indwelling catheter Weight: 106 lb 14.8 oz (48.5 kg) Height:  5\' 4"  (162.6 cm)  BEHAVIORAL SYMPTOMS/MOOD NEUROLOGICAL BOWEL NUTRITION STATUS      Incontinent Diet (See DC summary)  AMBULATORY STATUS COMMUNICATION OF NEEDS Skin   Extensive Assist   Normal                        Personal Care Assistance Level of Assistance  Bathing, Feeding, Dressing Bathing Assistance: Maximum assistance Feeding assistance: Limited assistance Dressing Assistance: Maximum assistance     Functional Limitations Info  Sight, Hearing, Speech Sight Info: Adequate Hearing Info: Adequate Speech Info: Adequate    SPECIAL CARE FACTORS FREQUENCY  PT (By licensed PT), OT (By licensed OT)     PT Frequency: 5 x a week OT Frequency: 5 x a week            Contractures Contractures Info: Not present    Additional Factors Info  Code Status, Allergies, Insulin  Sliding Scale Code Status Info: FULL Allergies Info: NKA   Insulin  Sliding Scale Info: insulin  aspart (novoLOG ) injection 0-9 Units  Dose: 0-9 Units  Freq: 3 times daily with meals Route: [place]  Start: 04/03/24 0800  Admin Instructions:  Do NOT hold if patient is NPO.  Sensitive Scale.  Order specific questions:  Correction coverage: Sensitive (thin, NPO, renal)  CBG < 70: Implement Hypoglycemia Standing Orders and refer to Hypoglycemia Standing Orders sidebar report  CBG 70 - 120: 0 units  CBG 121 - 150: 1 unit  CBG 151 - 200: 2 units  CBG 201 - 250: 3 units  CBG 251 - 300: 5 units  CBG 301 - 350: 7 units  CBG 351 - 400 9 units  CBG > 400 call MD and obtain STAT lab verification       Current Medications (04/03/2024):  This is the current Mccann  active medication list Current Facility-Administered Medications  Medication Dose Route Frequency Provider Last Rate Last Admin   acetaminophen  (TYLENOL ) tablet 650 mg  650 mg Oral Q6H PRN Adefeso, Oladapo, DO       cefTRIAXone  (ROCEPHIN ) 1 g in sodium chloride  0.9 % 100 mL IVPB  1 g Intravenous Q24H Adefeso, Oladapo, DO       Chlorhexidine  Gluconate Cloth 2 % PADS 6 each  6 each Topical Q0600 Aisha Hove, MD   6 each at 04/03/24 1027   clopidogrel  (PLAVIX ) tablet 75 mg  75 mg Oral Daily Adefeso, Oladapo, DO   75 mg at 04/03/24 0850   enoxaparin  (LOVENOX ) injection 40 mg   40 mg Subcutaneous Q24H Adefeso, Oladapo, DO   40 mg at 04/03/24 0852   feeding supplement (GLUCERNA SHAKE) (GLUCERNA SHAKE) liquid 237 mL  237 mL Oral TID BM Adefeso, Oladapo, DO   237 mL at 04/03/24 0851   insulin  aspart (novoLOG ) injection 0-9 Units  0-9 Units Subcutaneous TID WC Adefeso, Oladapo, DO   5 Units at 04/03/24 5462   lisinopril  (ZESTRIL ) tablet 5 mg  5 mg Oral Daily Adefeso, Oladapo, DO   5 mg at 04/03/24 0856   metoprolol  tartrate (LOPRESSOR ) tablet 12.5 mg  12.5 mg Oral BID Adefeso, Oladapo, DO   12.5 mg at 04/03/24 0850   multivitamin with minerals tablet 1 tablet  1 tablet Oral Daily Sreeram, Narendranath, MD       ondansetron  (ZOFRAN ) injection 4 mg  4 mg Intravenous Q6H PRN Adefeso, Oladapo, DO       Oral care mouth rinse  15 mL Mouth Rinse PRN Adefeso, Oladapo, DO       pravastatin  (PRAVACHOL ) tablet 40 mg  40 mg Oral Daily Adefeso, Oladapo, DO   40 mg at 04/03/24 7035     Discharge Medications: Please see discharge summary for a list of discharge medications.    Relevant Imaging Results:  Relevant Lab Results:   Additional Information SSN: [id number]  Cyndie Dredge, [place]

## 2024-04-03 NOTE — TOC Initial Note (Addendum)
 Transition of Care [facility]) - Initial/Assessment Note    Patient Details  Name: [name] MRN: [id number] Date of Birth: December 18, 1950  Transition of Care [facility]) CM/SW Contact:    Juanda Noon Phone Number: 04/03/2024, 11:50 AM  Clinical Narrative:                  This Clinical research associate spoke with patient first and then got permission to speak with daughter [name]. This Clinical research associate reviewed PT recommendation for SNF with daughter . Daughter stated that patient was at a SNF  called [facility] a few weeks ago and DC home with [facility]. Daughter is agreeable to SNF. Daughter stated that her first choice will be [facility] since it is convenient and local ; her second choice is [facility] . This Clinical research associate called [facility] and was transferred to the Child psychotherapist VM . Writer left a HIPAA confidential VM. TOC to follow.   [facility] Rehab referral sent through [facility]. Attempted to call [facility] to get fax number and was transferred to SW VM again.    Addendum 12:30 pm   Amalia Badder at [facility] back and shared that she would review patient referral and make a decision since patient was there not too long ago. Referral was sent via Fax.   Expected Discharge Plan: Skilled Nursing Facility Barriers to Discharge: Continued Medical Work up   Patient Goals and CMS Choice Patient states their goals for this hospitalization and ongoing recovery are:: DC to SNF CMS Medicare.gov Compare Post Acute Care list provided to:: Patient Represenative (must comment) (Daughter- Kayla) Choice offered to / list presented to : Adult Children      Expected Discharge Plan and Services In-house Referral: Clinical Social Work Discharge Planning Services: CM Consult Post Acute Care Choice: Durable Medical Equipment, Home Health, Skilled Nursing Facility Living arrangements for the past 2 months: Single Family Home                        Prior Living  Arrangements/Services Living arrangements for the past 2 months: Single Family Home Lives with:: Adult Children Patient language and need for interpreter reviewed:: Yes Do you feel safe going back to the place where you live?: Yes      Need for Family Participation in Patient Care: Yes (Comment) Care giver support system in place?: Yes (comment) Current home services: DME, Home PT, Home OT, Home RN Criminal Activity/Legal Involvement Pertinent to Current Situation/Hospitalization: No - Comment as needed  Activities of Daily Living   ADL Screening (condition at time of admission) Independently performs ADLs?: No Does the patient have a NEW difficulty with bathing/dressing/toileting/self-feeding that is expected to last >3 days?: No Does the patient have a NEW difficulty with getting in/out of bed, walking, or climbing stairs that is expected to last >3 days?: No Does the patient have a NEW difficulty with communication that is expected to last >3 days?: No Is the patient deaf or have difficulty hearing?: No Does the patient have difficulty seeing, even when wearing glasses/contacts?: No Does the patient have difficulty concentrating, remembering, or making decisions?: No  Permission Sought/Granted   Permission granted to share information with : Yes, Verbal Permission Granted  Share Information with NAME: [name]     Permission granted to share info w Relationship: Daughter     Emotional Assessment Appearance:: Appears stated age Attitude/Demeanor/Rapport: Engaged Affect (typically observed): Calm, Appropriate Orientation: : Oriented to Self,  Oriented to Place, Oriented to  Time, Oriented to Situation Alcohol / Substance Use: Not Applicable Psych Involvement: No (comment)  Admission diagnosis:  UTI (urinary tract infection) [N39.0] Generalized weakness [R53.1] Altered mental status, unspecified altered mental status type [R41.82] Urinary tract infection associated with indwelling  urethral catheter, initial encounter (HCC) [Z61.096E, N39.0] Patient Active Problem List   Diagnosis Date Noted   Protein-calorie malnutrition, severe 04/03/2024   UTI (urinary tract infection) 04/02/2024   Hypercalcemia 04/02/2024   Altered mental status 04/02/2024   Generalized weakness 04/02/2024   Failure to thrive in adult 04/02/2024   Hypoalbuminemia due to protein-calorie malnutrition (HCC) 04/02/2024   Type 2 diabetes mellitus with hyperglycemia (HCC) 04/02/2024   Essential hypertension 11/28/2018   Diabetic retinopathy (HCC) 02/17/2017   History of breast cancer 01/06/2017   Incisional hernia 04/05/2016   DM type 2 causing vascular disease (HCC) 10/21/2011   Stroke (HCC) 10/21/2011   Carotid disease, bilateral (HCC) 10/21/2011   Closed fracture of right distal radius and ulna 10/01/2011   Malignant neoplasm of female breast (HCC) 12/16/2009   RBBB 12/16/2009   Mixed hyperlipidemia 07/31/2009   CAD, ARTERY BYPASS GRAFT 07/31/2009   PCP:  Laurann Pollock, MD Pharmacy:   [facility] [phone] - [place], [place] - [address] [place] [address] Phone: [phone] Fax: [phone]  [facility] - [place], [place] - [address] [place] [address] Phone: [phone] Fax: [phone]     Social Drivers of Health (SDOH) Social History: SDOH Screenings   Food Insecurity: Unknown (04/03/2024)  Housing: Low Risk  (03/01/2024)   Received from [facility]  Transportation Needs: No Transportation Needs (03/01/2024)   Received from [facility]  Utilities: Patient Declined (04/03/2024)  Depression (PHQ2-9): Low Risk  (05/21/2020)  Financial Resource Strain: Low Risk  (01/27/2024)   [facility] from [facility]  Physical Activity: Inactive (01/27/2024)   Received from [facility]  Social Connections: Socially Isolated (01/27/2024)   [facility] from [facility]  Stress: No Stress Concern Present (03/01/2024)    Received from [facility]  Recent Concern: Stress - Stress Concern Present (01/27/2024)   Received from [facility]  Tobacco Use: Low Risk  (04/02/2024)  Health Literacy: Medium Risk (02/28/2024)   Received from [facility] Care   SDOH Interventions:     Readmission Risk Interventions    04/03/2024   11:48 AM  Readmission Risk Prevention Plan  Medication Screening Complete  Transportation Screening Complete

## 2024-04-03 NOTE — Plan of Care (Addendum)
  Problem: Acute Rehab PT Goals(only PT should resolve) Goal: Pt Will Go Supine/Side To Sit Outcome: Progressing Flowsheets (Taken 04/03/2024 1052) Pt will go Supine/Side to Sit: with moderate assist Goal: Patient Will Perform Sitting Balance Outcome: Progressing Flowsheets (Taken 04/03/2024 1052) Patient will perform sitting balance: with moderate assist Goal: Patient Will Transfer Sit To/From Stand Outcome: Progressing Flowsheets (Taken 04/03/2024 1052) Patient will transfer sit to/from stand: with moderate assist Goal: Pt Will Transfer Bed To Chair/Chair To Bed Outcome: Progressing Flowsheets (Taken 04/03/2024 1052) Pt will Transfer Bed to Chair/Chair to Bed: with max assist    10:55 AM, 04/03/24 Marysue Sola, PT, DPT [place] [facility]

## 2024-04-03 NOTE — Progress Notes (Addendum)
 Progress Note   Patient: [name] ZHY:[id number] DOB: 09-Aug-1951 DOA: 04/02/2024     1 DOS: the patient was seen and examined on 04/03/2024   Brief hospital course:  [name] is a 73 y.o. female with medical history significant of hypertension, hyperlipidemia, T2DM, CAD s/p CABG, bladder prolapse with indwelling Foley catheter who presents to the emergency department from home via EMS due to generalized weakness and altered mental status. She was noted to have UTI admitted to TRH service for further management.  Assessment and Plan: UTI POA Indwelling foley since 1 month placed for bladder prolapse, has been in and out of hospital for UTI, GI bleed last month per daughter. Asks if it can be removed. Order placed to remove foley, voiding trial. Continue IV ceftriaxone  therapy. Follow blood cultures, urine cultures. Mental status much better.  She is weak, will get PT/ OT evaluation.   Hypercalcemia Calcium  normalized with IV hydration. Monitor daily electrolytes.    Generalized weakness Failure to thrive in adult Encourage oral diet, supplements. Continue fall precaution PT/ OT eval, may need rehab placement.   Type 2 diabetes mellitus with hyperglycemia A1c 6.3, sugars in 200 possibly due to her infection. Continue Accuchecks, sliding scale insulin . Hold Metformin .   CAD s/p CABG, Mixed hyperlipidemia Continue Plavix , Pravachol    Essential hypertension BP lower side, caution with antihypertensives. Stopped lisinopril , lopressor , hydrochlorothiazide .  Severe protein calorie malnutrition BMI 18.35 Encourage oral diet, supplements. Dietician on board, feeding supplements ordered.  Nutrition Documentation    Flowsheet Row ED to Hosp-Admission (Current) from 04/02/2024 in [facility]ICAL [facility]  Nutrition Problem Severe Malnutrition  Etiology acute illness  [UTI]  Nutrition Goal Patient will meet greater than or equal to 90% of their needs   Interventions Magic cup, Glucerna shake, Liberalize Diet     ,  Active Pressure Injury/Wound(s)     Pressure Ulcer  Duration          Pressure Injury 04/02/24 Sacrum Medial;Lower Stage 2 -  Partial thickness loss of dermis presenting as a shallow open injury with a red, pink wound bed without slough. <1 day           (Optional):26781}   Out of bed to chair. Incentive spirometry. Nursing supportive care. Fall, aspiration precautions. Diet:  Diet Orders (From admission, onward)     Start     Ordered   04/03/24 1109  Diet regular Room service appropriate? Yes with Assist; Fluid consistency: Thin  Diet effective now       Question Answer Comment  Room service appropriate? Yes with Assist   Fluid consistency: Thin      04/03/24 1108           DVT prophylaxis: enoxaparin  (LOVENOX ) injection 40 mg Start: 04/03/24 1000 SCDs Start: 04/02/24 2329  Level of care: Med-Surg   Code Status: Full Code  Subjective: Patient is seen and examined today morning. She is lying in bed. She is weak, eating poor. Denies abdominal pain or nausea.  Physical Exam: Vitals:   04/03/24 0411 04/03/24 0748 04/03/24 0842 04/03/24 1128  BP: 114/74 108/74  (!) 90/49  Pulse: (!) 105 100  71  Resp: 18 16  16   Temp: (!) 97.5 F (36.4 C) 97.6 F (36.4 C) (!) 97.1 F (36.2 C) (!) 97.5 F (36.4 C)  TempSrc: Oral Oral Rectal Oral  SpO2: 100% 99%  99%  Weight:      Height:        General -  Elderly ill cachectic Caucasian female, no apparent distress HEENT - PERRLA, EOMI, atraumatic head, non tender sinuses. Lung - Clear, basal rales, no rhonchi, wheezes. Heart - S1, S2 heard, no murmurs, rubs, 2+ pedal edema. Abdomen - Soft, non tender, bowel sounds good Neuro - Alert, awake, oriented, slow mentation, non focal exam. Skin - Warm and dry.  Data Reviewed:      Latest Ref Rng & Units 04/03/2024    5:29 AM 04/02/2024    6:56 PM 10/14/2020    3:53 PM  CBC  WBC 4.0 - 10.5 K/uL 6.5  6.7   4.1   Hemoglobin 12.0 - 15.0 g/dL 13.0  86.5  78.4   Hematocrit 36.0 - 46.0 % 34.1  33.3  36.8   Platelets 150 - 400 K/uL 326  358  375       Latest Ref Rng & Units 04/03/2024    5:29 AM 04/02/2024    6:56 PM 10/14/2020    3:53 PM  BMP  Glucose 70 - 99 mg/dL 696  295  284   BUN 8 - 23 mg/dL 20  20  9    Creatinine 0.44 - 1.00 mg/dL 1.32  4.40  1.02   Sodium 135 - 145 mmol/L 133  134  137   Potassium 3.5 - 5.1 mmol/L 4.3  4.0  4.6   Chloride 98 - 111 mmol/L 101  99  104   CO2 22 - 32 mmol/L 23  26  24    Calcium  8.9 - 10.3 mg/dL 72.5  36.6  9.6    DG Chest Port 1 View Result Date: 04/02/2024 CLINICAL DATA:  Altered mental status EXAM: PORTABLE CHEST 1 VIEW COMPARISON:  09/22/2022 FINDINGS: Post sternotomy changes. Minimal atelectasis left base. Normal cardiac size. No pleural effusion or pneumothorax IMPRESSION: Minimal atelectasis at the left base. Electronically Signed   By: Esmeralda Hedge M.D.   On: 04/02/2024 19:15   CT HEAD WO CONTRAST Result Date: 04/02/2024 CLINICAL DATA:  Mental status change weakness and lethargy EXAM: CT HEAD WITHOUT CONTRAST TECHNIQUE: Contiguous axial images were obtained from the base of the skull through the vertex without intravenous contrast. RADIATION DOSE REDUCTION: This exam was performed according to the departmental dose-optimization program which includes automated exposure control, adjustment of the mA and/or kV according to patient size and/or use of iterative reconstruction technique. COMPARISON:  CT brain 05/12/2018 report FINDINGS: Brain: No acute territorial infarction, hemorrhage or intracranial mass. Mild chronic small vessel ischemic changes of the white matter. 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 Nonenlarged ventricles Vascular: No hyperdense vessels.  Carotid vascular calcification Skull: Normal. Negative for fracture or focal lesion. Sinuses/Orbits: No acute finding. Other: None IMPRESSION: 1. No CT  evidence for acute intracranial abnormality. 2. Mild chronic small vessel ischemic changes of the white matter. 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 Electronically Signed   By: Esmeralda Hedge M.D.   On: 04/02/2024 19:15    Family Communication: Discussed with patient, understand and agree. All questions answered.  Disposition: Status is: Inpatient Remains inpatient appropriate because: IV antibiotics, pending cultures, PT eval  Planned Discharge Destination: Rehab     Time spent: 40 minutes  Author: Aisha Hove, MD 04/03/2024 1:11 PM Secure chat 7am to 7pm For on call review [email or web address].

## 2024-04-03 NOTE — Progress Notes (Addendum)
 Initial Nutrition Assessment  DOCUMENTATION CODES:   Severe malnutrition in context of acute illness/injury, Underweight  INTERVENTION:   Liberalize diet to regular. Continue Glucerna Shake po TID, each supplement provides 220 kcal and 10 grams of protein. Add Magic cup TID with meals, each supplement provides 290 kcal and 9 grams of protein. Add MVI with minerals daily.  NUTRITION DIAGNOSIS:   Severe Malnutrition related to acute illness (UTI) as evidenced by moderate muscle depletion, severe muscle depletion, moderate fat depletion, severe fat depletion.  GOAL:   Patient will meet greater than or equal to 90% of their needs  MONITOR:   PO intake, Supplement acceptance  REASON FOR ASSESSMENT:   Consult Assessment of nutrition requirement/status  ASSESSMENT:   73 yo female admitted with UTI, weakness, confusion. PMH includes HTN, HLD, DM-2, CAD, bladder prolapse w/ indwelling foley catheter, stroke.  Patient sleeping during RD visit. She mumbled a few sentences, but patient unable to provide any nutrition hx. Per H&P, family was concerned about decreased appetite PTA. Patient has had generalized weakness for a few weeks.   Currently on a heart healthy carb modified diet, meal intakes not recorded. She has been given a Glucerna shake this morning, about 75% remains in container on bedside table. Breakfast meal untouched.   Labs reviewed. Na 133, phos 2.3 CBG: 228-263  Medications reviewed and include novolog , IV rocephin .  Weight history reviewed. Weight was 54.3 kg 3.5 years ago. Currently 48.5 kg. Patient with moderate pitting edema to BLE, which is likely masking actual weight loss and further depletion of muscle mass.   Patient meets criteria for severe malnutrition, given moderate-severe depletion of muscle and subcutaneous fat mass. Suspect malnutrition is r/t acute illness with recent poor appetite and decreased intake, but could have a chronic component as well.    NUTRITION - FOCUSED PHYSICAL EXAM:  Flowsheet Row Most Recent Value  Orbital Region Severe depletion  Upper Arm Region Mild depletion  Thoracic and Lumbar Region Severe depletion  Buccal Region Moderate depletion  Temple Region Severe depletion  Clavicle Bone Region Severe depletion  Clavicle and Acromion Bone Region Severe depletion  Scapular Bone Region Severe depletion  Dorsal Hand Moderate depletion  Patellar Region Moderate depletion  Anterior Thigh Region Moderate depletion  Posterior Calf Region Moderate depletion  Edema (RD Assessment) Moderate  Hair Reviewed  Eyes Reviewed  Mouth Unable to assess  Skin Reviewed  Nails Reviewed       Diet Order:   Diet Order             Diet regular Room service appropriate? Yes with Assist; Fluid consistency: Thin  Diet effective now                   EDUCATION NEEDS:   No education needs have been identified at this time  Skin:  Skin Assessment: Skin Integrity Issues: Skin Integrity Issues:: Stage II Stage II: sacrum  Last BM:  5/20 type 6  Height:   Ht Readings from Last 1 Encounters:  04/02/24 5\' 4"  (1.626 m)    Weight:   Wt Readings from Last 1 Encounters:  04/02/24 48.5 kg    Ideal Body Weight:  54.5 kg  BMI:  Body mass index is 18.35 kg/m.  Estimated Nutritional Needs:   Kcal:  1500-1700  Protein:  70-80 gm  Fluid:  >/= 1.5 L   Barnet Boots RD, LDN, CNSC Contact via secure chat. If unavailable, use group chat "RD Inpatient."

## 2024-04-03 NOTE — Plan of Care (Signed)
  Problem: Clinical Measurements: Goal: Ability to maintain clinical measurements within normal limits will improve Outcome: Progressing Goal: Will remain free from infection Outcome: Progressing Goal: Diagnostic test results will improve Outcome: Progressing Goal: Respiratory complications will improve Outcome: Progressing Goal: Cardiovascular complication will be avoided Outcome: Progressing   Problem: Activity: Goal: Risk for activity intolerance will decrease Outcome: Progressing   Problem: Nutrition: Goal: Adequate nutrition will be maintained Outcome: Progressing   Problem: Elimination: Goal: Will not experience complications related to bowel motility Outcome: Progressing Goal: Will not experience complications related to urinary retention Outcome: Progressing   Problem: Pain Managment: Goal: General experience of comfort will improve and/or be controlled Outcome: Progressing   Problem: Safety: Goal: Ability to remain free from injury will improve Outcome: Progressing

## 2024-04-03 NOTE — Evaluation (Addendum)
 Physical Therapy Evaluation Patient Details Name: [name] MRN: [id number] DOB: [birth year] Today's Date: 04/03/2024  History of Present Illness  [name] is a 73 y.o. female with medical history significant of hypertension, hyperlipidemia, T2DM, CAD s/p CABG, bladder prolapse with indwelling Foley catheter who presents to the emergency department from home via EMS due to generalized weakness and altered mental status.  At bedside, patient was unable to provide much history other than she has been feeling weak for a few weeks.  Rest of the history was obtained from ED PA and ED medical record.  Per report, patient has been having worsening weakness for few weeks and this rapidly worsening this morning due to being less responsive.  Patient was ambulatory after 2 months ago (prior to admission for urosepsis in March 2025 and GI bleed last month- April).  Family was concerned for UTI and also for decreased appetite.  She lives at home with daughters (caregivers).  Foley was placed about a month ago and family does not want Foley replaced on removal of current Foley.  Patient is bedbound   Clinical Impression  Patient limited in PT Evaluation due to overall fatigue and weakness. Per MD note via history from family, patient is bed bound at baseline. On this date, patient does require Max-total A for all bed mobility and functional STS with RW. Patient demonstrates very slow, small, and labored movement t/o session. Once seated EOB, patient unable to maintain upright sitting for more than a few seconds at a time due to weakness. Patient unable to perform antigravity movements with BLE as well. Patient reports she lives with her daughter who is available intermittently due to work. Patient will benefit from continued skilled physical therapy acutely and in recommended venue in order to address her impairments in order to return home with max function and improve QOL.        If plan is  discharge home, recommend the following: Two people to help with walking and/or transfers;Two people to help with bathing/dressing/bathroom;Assistance with cooking/housework;Help with stairs or ramp for entrance;Assistance with feeding;Assist for transportation   Can travel by private vehicle   No    Equipment Recommendations None recommended by PT  Recommendations for Other Services       Functional Status Assessment Patient has had a recent decline in their functional status and demonstrates the ability to make significant improvements in function in a reasonable and predictable amount of time.     Precautions / Restrictions Precautions Precautions: Fall Restrictions Weight Bearing Restrictions Per Provider Order: No      Mobility  Bed Mobility Overal bed mobility: Needs Assistance Bed Mobility: Supine to Sit, Sit to Supine, Rolling Rolling: Max assist   Supine to sit: Max assist, Total assist Sit to supine: Max assist, Total assist   General bed mobility comments: Total-Max A for bed. Pt w/ very slow, small, and labored movement towards EOB. Max A for rolling, With verbal cueing, pt can initiate UE towards railing but max A to complete roll. Patient Response: Flat affect  Transfers Overall transfer level: Needs assistance Equipment used: Rolling walker (2 wheels) Transfers: Sit to/from Stand Sit to Stand: Max assist, Total assist           General transfer comment: Total-Max A for bed. Pt w/ very slow, small, and labored movement. Educ on 1:1 hand placement with RW. Unable to fully extend hips to complete full stand. Max/total A to scoot towards HOB.    Ambulation/Gait  General Gait Details: Unable to safely assess this date. Pt bedbound at baseline.  Stairs            Wheelchair Mobility     Tilt Bed Tilt Bed Patient Response: Flat affect  Modified Rankin (Stroke Patients Only)       Balance Overall balance assessment: Needs  assistance Sitting-balance support: Bilateral upper extremity supported, Feet supported Sitting balance-Leahy Scale: Poor Sitting balance - Comments: Poor sitting balance EOB with pt leaning posteriorly at times and requiring assist to maintain. Pt reports due to weakness, not dizziness.   Standing balance support: During functional activity, Reliant on assistive device for balance, Bilateral upper extremity supported Standing balance-Leahy Scale: Poor Standing balance comment: Unable to complete full stand without max A. Posterior lean t/o.                             Pertinent Vitals/Pain Pain Assessment Pain Assessment: No/denies pain    Home Living Family/patient expects to be discharged to:: Private residence Living Arrangements: Children Available Help at Discharge: Family;Available PRN/intermittently Type of Home: House Home Access: Stairs to enter Entrance Stairs-Rails: Left Entrance Stairs-Number of Steps: 3   Home Layout: Two level Home Equipment: Rolling Walker (2 wheels);Grab bars - tub/shower;Shower seat      Prior Function Prior Level of Function : Needs assist       Physical Assist : ADLs (physical);Mobility (physical) Mobility (physical): Bed mobility;Transfers ADLs (physical): Bathing;Dressing;Toileting;IADLs Mobility Comments: Per MD note/family history, pt bed bound ADLs Comments: Pt reports no assist but unclear as pt is bed bound per family report     Extremity/Trunk Assessment   Upper Extremity Assessment Upper Extremity Assessment: Defer to OT evaluation    Lower Extremity Assessment Lower Extremity Assessment: Generalized weakness (Pt very weak. Unable to move LE against gravity in seated or supine positions. 2/5 MMT at best.)    Cervical / Trunk Assessment Cervical / Trunk Assessment: Kyphotic  Communication   Communication Communication: Impaired Factors Affecting Communication: Reduced clarity of speech (Very soft speech.  Slurred at times)    Cognition Arousal: Lethargic Behavior During Therapy: Flat affect   PT - Cognitive impairments: No family/caregiver present to determine baseline                       PT - Cognition Comments: Oriented to self. Following commands: Intact (Slow, labored movement)       Cueing Cueing Techniques: Verbal cues, Tactile cues     General Comments      Exercises     Assessment/Plan    PT Assessment Patient needs continued PT services;All further PT needs can be met in the next venue of care  PT Problem List Decreased strength;Decreased range of motion;Decreased activity tolerance;Decreased balance;Decreased mobility       PT Treatment Interventions DME instruction;Gait training;Stair training;Functional mobility training;Wheelchair mobility training;Therapeutic activities;Therapeutic exercise;Balance training;Patient/family education    PT Goals (Current goals can be found in the Care Plan section)  Acute Rehab PT Goals Patient Stated Goal: Return home following rehab stay PT Goal Formulation: With patient Time For Goal Achievement: 04/10/24 Potential to Achieve Goals: Fair    Frequency Min 3X/week     Co-evaluation               AM-PAC PT "6 Clicks" Mobility  Outcome Measure Help needed turning from your back to your side while in a flat bed without using bedrails?: A  Lot Help needed moving from lying on your back to sitting on the side of a flat bed without using bedrails?: A Lot Help needed moving to and from a bed to a chair (including a wheelchair)?: A Lot Help needed standing up from a chair using your arms (e.g., wheelchair or bedside chair)?: A Lot Help needed to walk in hospital room?: A Lot Help needed climbing 3-5 steps with a railing? : Total 6 Click Score: 11    End of Session Equipment Utilized During Treatment: Gait belt Activity Tolerance: Patient limited by fatigue Patient left: in bed;with call bell/phone within  reach;with bed alarm set   PT Visit Diagnosis: Other abnormalities of gait and mobility (R26.89);Difficulty in walking, not elsewhere classified (R26.2);Muscle weakness (generalized) (M62.81);Adult, failure to thrive (R62.7)    Time: 0920-0943 PT Time Calculation (min) (ACUTE ONLY): 23 min   Charges:   PT Evaluation $PT Eval Moderate Complexity: 1 Mod PT Treatments $Therapeutic Activity: 23-37 mins PT General Charges $$ ACUTE PT VISIT: 1 Visit       10:55 AM, 04/03/24 Marysue Sola, PT, DPT [place] [facility]

## 2024-04-04 DIAGNOSIS — N390 Urinary tract infection, site not specified: Secondary | ICD-10-CM | POA: Diagnosis not present

## 2024-04-04 DIAGNOSIS — T83511A Infection and inflammatory reaction due to indwelling urethral catheter, initial encounter: Secondary | ICD-10-CM | POA: Diagnosis not present

## 2024-04-04 LAB — GLUCOSE, CAPILLARY
Glucose-Capillary: 189 mg/dL — ABNORMAL HIGH (ref 70–99)
Glucose-Capillary: 199 mg/dL — ABNORMAL HIGH (ref 70–99)
Glucose-Capillary: 207 mg/dL — ABNORMAL HIGH (ref 70–99)
Glucose-Capillary: 211 mg/dL — ABNORMAL HIGH (ref 70–99)
Glucose-Capillary: 350 mg/dL — ABNORMAL HIGH (ref 70–99)
Glucose-Capillary: >600 mg/dL (ref 70–99)

## 2024-04-04 LAB — GLUCOSE, RANDOM: Glucose, Bld: 249 mg/dL — ABNORMAL HIGH (ref 70–99)

## 2024-04-04 LAB — PARATHYROID HORMONE, INTACT (NO CA): PTH: 41 pg/mL (ref 15–65)

## 2024-04-04 MED ORDER — INSULIN ASPART 100 UNIT/ML IJ SOLN
5.0000 [IU] | Freq: Once | INTRAMUSCULAR | Status: AC
  Administered 2024-04-04: 5 [IU] via SUBCUTANEOUS

## 2024-04-04 MED ORDER — INSULIN ASPART 100 UNIT/ML IJ SOLN
0.0000 [IU] | Freq: Three times a day (TID) | INTRAMUSCULAR | Status: DC
  Administered 2024-04-05: 5 [IU] via SUBCUTANEOUS
  Administered 2024-04-05: 2 [IU] via SUBCUTANEOUS

## 2024-04-04 NOTE — Plan of Care (Signed)
  Problem: Acute Rehab OT Goals (only OT should resolve) Goal: Pt. Will Perform Eating Flowsheets (Taken 04/04/2024 1054) Pt Will Perform Eating: Independently Goal: Pt. Will Perform Grooming Flowsheets (Taken 04/04/2024 1054) Pt Will Perform Grooming:  with min assist  with contact guard assist  sitting Goal: Pt. Will Perform Upper Body Dressing Flowsheets (Taken 04/04/2024 1054) Pt Will Perform Upper Body Dressing:  with min assist  with contact guard assist  sitting Goal: Pt. Will Perform Lower Body Dressing Flowsheets (Taken 04/04/2024 1054) Pt Will Perform Lower Body Dressing:  with mod assist  sitting/lateral leans Goal: Pt. Will Transfer To Toilet Flowsheets (Taken 04/04/2024 1054) Pt Will Transfer to Toilet:  with mod assist  stand pivot transfer Goal: Pt. Will Perform Toileting-Clothing Manipulation Flowsheets (Taken 04/04/2024 1054) Pt Will Perform Toileting - Clothing Manipulation and hygiene:  with mod assist  sitting/lateral leans  bed level Goal: Pt/Caregiver Will Perform Home Exercise Program Flowsheets (Taken 04/04/2024 1054) Pt/caregiver will Perform Home Exercise Program:  Increased strength  Increased ROM  With minimal assist  Both right and left upper extremity  Kinleigh Nault OT, MOT

## 2024-04-04 NOTE — Plan of Care (Signed)
  Problem: Clinical Measurements: Goal: Will remain free from infection Outcome: Progressing   Problem: Clinical Measurements: Goal: Respiratory complications will improve Outcome: Progressing   Problem: Pain Managment: Goal: General experience of comfort will improve and/or be controlled Outcome: Progressing

## 2024-04-04 NOTE — Evaluation (Signed)
 Occupational Therapy Evaluation Patient Details Name: [name] MRN: [id number] DOB: Oct 23, 1951 Today's Date: 04/04/2024   History of Present Illness   [name] is a 73 y.o. female with medical history significant of hypertension, hyperlipidemia, T2DM, CAD s/p CABG, bladder prolapse with indwelling Foley catheter who presents to the emergency department from home via EMS due to generalized weakness and altered mental status.  At bedside, patient was unable to provide much history other than she has been feeling weak for a few weeks.  Rest of the history was obtained from ED PA and ED medical record.  Per report, patient has been having worsening weakness for few weeks and this rapidly worsening this morning due to being less responsive.  Patient was ambulatory after 2 months ago (prior to admission for urosepsis in March 2025 and GI bleed last month- April).  Family was concerned for UTI and also for decreased appetite.  She lives at home with daughters (caregivers).  Foley was placed about a month ago and family does not want Foley replaced on removal of current Foley.  Patient is bedbound     Clinical Impressions Pt agreeable to OT evaluation. Pt lethargic/presenting with flat affect. Not oriented fully to place and not oriented to year. No family present to provide additional history. Max to total assist for bed mobility and transfer to chair without AD. Attempted sit to stand with RW but pt demonstrated limited ability for this. Pt requires max to total assist for lower body ADL's and at least mod to max A for upper body. Limited B UE shoulder flexion and very weak generally. Pt left in the chair with call bell within reach and chair alarm set. Pt will benefit from continued OT in the hospital and recommended venue below to increase strength, balance, and endurance for safe ADL's.        If plan is discharge home, recommend the following:   A lot of help with walking and/or  transfers;A lot of help with bathing/dressing/bathroom;Assistance with cooking/housework;Assistance with feeding;Assist for transportation;Help with stairs or ramp for entrance;Direct supervision/assist for medications management     Functional Status Assessment   Patient has had a recent decline in their functional status and demonstrates the ability to make significant improvements in function in a reasonable and predictable amount of time.     Equipment Recommendations   None recommended by OT             Precautions/Restrictions   Precautions Precautions: Fall Recall of Precautions/Restrictions: Impaired Restrictions Weight Bearing Restrictions Per Provider Order: No     Mobility Bed Mobility Overal bed mobility: Needs Assistance Bed Mobility: Supine to Sit     Supine to sit: Max assist, Total assist     General bed mobility comments: Much assist; pt weak with limited ability to assist with mobility.    Transfers Overall transfer level: Needs assistance Equipment used: Rolling walker (2 wheels) Transfers: Sit to/from Stand, Bed to chair/wheelchair/BSC Sit to Stand: Max assist, Total assist Stand pivot transfers: Max assist, Total assist         General transfer comment: Max to total assist for sit to stand with RW; unsteady and seemingly unsafe with RW; graded to no RW with therapist providing much support for stand pivot to chair.      Balance Overall balance assessment: Needs assistance Sitting-balance support: Bilateral upper extremity supported, Feet supported Sitting balance-Leahy Scale: Poor Sitting balance - Comments: poster lean; noted to lean all the way back  in bed needing assist to pull back up to sitting. Postural control: Posterior lean Standing balance support: During functional activity, Reliant on assistive device for balance, Bilateral upper extremity supported Standing balance-Leahy Scale: Poor Standing balance comment: Very poor  with and without AD                           ADL either performed or assessed with clinical judgement   ADL Overall ADL's : Needs assistance/impaired     Grooming: Maximal assistance;Sitting;Moderate assistance   Upper Body Bathing: Maximal assistance;Sitting   Lower Body Bathing: Maximal assistance;Total assistance;Sitting/lateral leans   Upper Body Dressing : Moderate assistance;Maximal assistance;Sitting   Lower Body Dressing: Total assistance;Maximal assistance;Sitting/lateral leans   Toilet Transfer: Total assistance;Maximal assistance;Stand-pivot Toilet Transfer Details (indicate cue type and reason): Simulated via sit to stand and stand pivot to chair. Toileting- Clothing Manipulation and Hygiene: Maximal assistance;Total assistance;Bed level               Vision Baseline Vision/History: 1 Wears glasses Ability to See in Adequate Light: 1 Impaired Patient Visual Report: Diplopia (reports resolved) Vision Assessment?:  (No obvious deficit noted; will continue to monitor.)     Perception Perception: Not tested       Praxis Praxis: Not tested       Pertinent Vitals/Pain Pain Assessment Pain Assessment: Faces Faces Pain Scale: No hurt     Extremity/Trunk Assessment Upper Extremity Assessment Upper Extremity Assessment: RUE deficits/detail;LUE deficits/detail RUE Deficits / Details: 3-/5 shoulder flexion; generally weak otherwise seemingly at 3+/5 grossly other than shoulder. LUE Deficits / Details: 2+/5 shoulder flexion; generally weak otherwise seemingly at 3+/5 grossly other than shoulder.   Lower Extremity Assessment Lower Extremity Assessment: Defer to PT evaluation   Cervical / Trunk Assessment Cervical / Trunk Assessment: Kyphotic   Communication Communication Communication: Impaired Factors Affecting Communication: Other (comment) (soft spoken; not awlwasy answering when asked questions.)   Cognition Arousal: Lethargic Behavior  During Therapy: Flat affect Cognition: Cognition impaired   Orientation impairments: Time, Place         OT - Cognition Comments: Pt knew she was in the hospital but could not name which one. Pt was able to verbalize her name. Pt could note state the year.                 Following commands: Intact       Cueing  General Comments   Cueing Techniques: Verbal cues;Tactile cues                 Home Living Family/patient expects to be discharged to:: Private residence Living Arrangements: Children Available Help at Discharge: Family;Available PRN/intermittently Type of Home: House Home Access: Stairs to enter [employer] of Steps: 3 Entrance Stairs-Rails: Left Home Layout: Two level     Bathroom Shower/Tub: Producer, television/film/video: Standard Bathroom Accessibility: No   Home Equipment: Agricultural consultant (2 wheels);Grab bars - tub/shower;Shower seat   Additional Comments: Per chart review; pt is a limited historian at this time.      Prior Functioning/Environment Prior Level of Function : Needs assist       Physical Assist : ADLs (physical);Mobility (physical) Mobility (physical): Bed mobility;Transfers ADLs (physical): Bathing;Dressing;Toileting;IADLs Mobility Comments: Per MD note/family history, pt bed bound the past 2 months but was walking prior to this. ADLs Comments: Pt reports no assist but unclear as pt is bed bound per family report (Per chart; pt reported independence  initially but then stated she needed assist for donning socks.)    OT Problem List: Decreased strength;Decreased range of motion;Decreased activity tolerance;Impaired balance (sitting and/or standing);Decreased cognition;Decreased coordination;Decreased safety awareness;Decreased knowledge of use of DME or AE;Impaired UE functional use   OT Treatment/Interventions: Self-care/ADL training;Therapeutic exercise;Energy conservation;DME and/or AE instruction;Therapeutic  activities;Cognitive remediation/compensation;Patient/family education;Visual/perceptual remediation/compensation;Balance training      OT Goals(Current goals can be found in the care plan section)   Acute Rehab OT Goals Patient Stated Goal: Pt seemed agreeable to rehab but not really stating a goal. OT Goal Formulation: With patient Time For Goal Achievement: 04/18/24 Potential to Achieve Goals: Good   OT Frequency:  Min 2X/week                                   End of Session Equipment Utilized During Treatment: Rolling walker (2 wheels);Gait belt Nurse Communication: Mobility status  Activity Tolerance: Patient limited by lethargy Patient left: in chair;with call bell/phone within reach;with chair alarm set  OT Visit Diagnosis: Unsteadiness on feet (R26.81);Other abnormalities of gait and mobility (R26.89);Muscle weakness (generalized) (M62.81);Other symptoms and signs involving cognitive function;Adult, failure to thrive (R62.7)                Time: 0981-1914 OT Time Calculation (min): 24 min Charges:  OT General Charges $OT Visit: 1 Visit OT Evaluation $OT Eval Low Complexity: 1 Low  Evangelynn Lochridge OT, MOT  Thurnell Floss 04/04/2024, 10:51 AM

## 2024-04-04 NOTE — Progress Notes (Signed)
 [facility] PROGRESS NOTE  [name] (DOB: 1951/06/04) ZOX:[id number] PCP: Laurann Pollock, MD  Brief Narrative: [name] is a 73 y.o. female with a history of CAD s/p CABG, HTN, HLD, T2DM, bladder prolapse with indwelling foley catheter who presented to the ED on 04/02/2024 from home by EMS due to AMS and increased weakness. Work up revealed UTI for which voiding trial has been pursued and antibiotics given. She will require further rehabilitation, SNF placement being pursued, though family may opt to take her home with home health.   Subjective: No new complaints, just feels worn out.   Objective: BP (!) 94/56 (BP Location: Right Arm)   Pulse 87   Temp 97.6 F (36.4 C) (Oral)   Resp 16   Ht 5\' 4"  (1.626 m)   Wt 48.5 kg   SpO2 100%   BMI 18.35 kg/m   Gen: Elderly, frail appearing female in no distress Pulm: Clear, nonlabored  CV: RRR, no MRG GI: Soft, NT, ND, +BS  Neuro: Alert and oriented. No new focal deficits, though diffusely quite weak. Ext: Warm, no deformities Skin: Sternotomy scar noted   Assessment & Plan: CAUTI: POA.  - Voiding trial passed, DC'ed foley.  - Continue ceftriaxone  pending culture results (E. coli thus far). Afebrile, WBC normal.    Acute metabolic encephalopathy: Reported, now improved with UTI Tx.     Generalized weakness, failure to thrive in adult - Encourage oral diet, supplements. - Continue fall precaution - PT/ OT eval, may need rehab placement.  T2DM: HbA1c 6.3%.  - SSI - Hold metformin  for now.    CAD s/p CABG: No angina.  - Continue plavix , statin  - Holding BB due to soft BP  HTN:  - Holding home Tx with soft BP  HLD:  - Continue statin  Severe protein calorie malnutrition: BMI 18.35  Hypercalcemia: Improved with rehydration.  - Given her concomitant renal insufficiency (more likely due to HTN, T2DM), anemia, and now hypercalcemia, will send myeloma work up  Wynetta Heckle, MD [facility] [email or web address] 04/04/2024, 2:41 PM

## 2024-04-04 NOTE — TOC Progression Note (Signed)
 Transition of Care [facility]) - Progression Note    Patient Details  Name: [name] MRN: [id number] Date of Birth: Oct 30, 1951  Transition of [facility] CM/SW Contact  Orelia Binet, RN Phone Number: 04/04/2024, 10:21 AM  Clinical Narrative:   CM called [facility], family first choice. Referral faxed yesterday. CM spoke with [facility] B[facility] in [place]. They do not have a bed available. CM called her daughter, Lane Pinon to discuss the [place] bed offer.  Daughter really wants her at [facility], where she works or she will take her home to HHPT with [facility]. Lane Pinon will also call [facility] to confirm no beds available. Team updated, TOC following.     Expected Discharge Plan: Home w Home Health Services Barriers to Discharge: Continued Medical Work up  Expected Discharge Plan and Services In-house Referral: Clinical Social Work Discharge Planning Services: CM Consult Post Acute Care Choice: Durable Medical Equipment, Home Health, Skilled Nursing Facility Living arrangements for the past 2 months: Single Family Home                     Social Determinants of Health (SDOH) Interventions SDOH Screenings   Food Insecurity: Unknown (04/03/2024)  Housing: Low Risk  (03/01/2024)   Received from [facility]  Transportation Needs: No Transportation Needs (03/01/2024)   Received from [facility]  Utilities: Patient Declined (04/03/2024)  Depression (PHQ2-9): Low Risk  (05/21/2020)  Financial Resource Strain: Low Risk  (01/27/2024)   Received from [facility]  Physical Activity: Inactive (01/27/2024)   Received from [facility]  Social Connections: Socially Isolated (01/27/2024)   Received from [facility]  Stress: No Stress Concern Present (03/01/2024)   Received from [facility]  Recent Concern: Stress - Stress Concern Present (01/27/2024)   Received from [facility]  Tobacco Use: Low Risk  (04/02/2024)  Health Literacy: Medium Risk (02/28/2024)   Received from [facility]     Readmission Risk Interventions    04/03/2024   11:48 AM  Readmission Risk Prevention Plan  Medication Screening Complete  Transportation Screening Complete

## 2024-04-05 DIAGNOSIS — T83511A Infection and inflammatory reaction due to indwelling urethral catheter, initial encounter: Secondary | ICD-10-CM | POA: Diagnosis not present

## 2024-04-05 DIAGNOSIS — N390 Urinary tract infection, site not specified: Secondary | ICD-10-CM | POA: Diagnosis not present

## 2024-04-05 LAB — GLUCOSE, CAPILLARY
Glucose-Capillary: 146 mg/dL — ABNORMAL HIGH (ref 70–99)
Glucose-Capillary: 176 mg/dL — ABNORMAL HIGH (ref 70–99)

## 2024-04-05 LAB — RENAL FUNCTION PANEL
Albumin: 1.7 g/dL — ABNORMAL LOW (ref 3.5–5.0)
Anion gap: 8 (ref 5–15)
BUN: 19 mg/dL (ref 8–23)
CO2: 27 mmol/L (ref 22–32)
Calcium: 9.9 mg/dL (ref 8.9–10.3)
Chloride: 102 mmol/L (ref 98–111)
Creatinine, Ser: 0.43 mg/dL — ABNORMAL LOW (ref 0.44–1.00)
GFR, Estimated: >60 mL/min (ref >60)
Glucose, Bld: 108 mg/dL — ABNORMAL HIGH (ref 70–99)
Phosphorus: 2.3 mg/dL — ABNORMAL LOW (ref 2.5–4.6)
Potassium: 2.9 mmol/L — ABNORMAL LOW (ref 3.5–5.1)
Sodium: 137 mmol/L (ref 135–145)

## 2024-04-05 MED ORDER — CEPHALEXIN 500 MG PO CAPS: 500.0000 mg | ORAL_CAPSULE | Freq: Two times a day (BID) | ORAL | 0 refills | Status: AC

## 2024-04-05 MED ORDER — POTASSIUM CHLORIDE CRYS ER 20 MEQ PO TBCR
40.0000 meq | EXTENDED_RELEASE_TABLET | Freq: Four times a day (QID) | ORAL | Status: DC
  Administered 2024-04-05: 40 meq via ORAL
  Filled 2024-04-05: qty 2

## 2024-04-05 MED ORDER — K PHOS MONO-SOD PHOS DI & MONO 155-852-130 MG PO TABS
500.0000 mg | ORAL_TABLET | Freq: Two times a day (BID) | ORAL | Status: DC
  Administered 2024-04-05: 500 mg via ORAL
  Filled 2024-04-05: qty 2

## 2024-04-05 MED ORDER — K PHOS MONO-SOD PHOS DI & MONO 155-852-130 MG PO TABS: 250.0000 mg | ORAL_TABLET | Freq: Two times a day (BID) | ORAL | 0 refills | Status: AC

## 2024-04-05 NOTE — Care Management CC44 (Signed)
 Condition Code 44 Documentation Completed  Patient Details  Name: [name] MRN: [id number] Date of Birth: 06/23/1951   Condition Code 44 given:  Yes Patient signature on Condition Code 44 notice:  Yes Documentation of 2 MD's agreement:  Yes Code 44 added to claim:  Yes    Orelia Binet, RN 04/05/2024, 11:26 AM

## 2024-04-05 NOTE — TOC Transition Note (Signed)
 Transition of Care [facility]) - Discharge Note   Patient Details  Name: [name] MRN: [id number] Date of Birth: July 06, 1951  Transition of Care [facility]) CM/SW Contact:  Orelia Binet, RN Phone Number: 04/05/2024, 11:09 AM   Clinical Narrative:   MD discussed discharge with dater, Lane Pinon. She is ready for her mother to come home with [facility] HHPT/OT/RN. They are scheduled to see her tomorrow.  Kayla requested a wheelchair, referral sent to Zach with [employer], he will deliver to the room. Team updated, Lane Pinon will be her at 12:30 to transport her home. CM explained the Status change to OBS and will go to the room to have the patient sign. They will continue to work on placement at [facility] for rehab or LTC.    Final next level of care: Home w Home Health Services Barriers to Discharge: Barriers Resolved   Patient Goals and CMS Choice Patient states their goals for this hospitalization and ongoing recovery are:: Wants to go home with home health. CMS Medicare.gov Compare Post Acute Care list provided to:: Patient Represenative (must comment) (Daughter- Kayla) Choice offered to / list presented to : Adult Children     Discharge Placement               Patient to be transferred to facility by: Daughter Name of family member notified: daughter Patient and family notified of of transfer: 04/05/24  Discharge Plan and Services Additional resources added to the After Visit Summary for   In-house Referral: Clinical Social Work Discharge Planning Services: CM Consult Post Acute Care Choice: Durable Medical Equipment, Home Health, Skilled Nursing Facility                   HH Arranged: PT, OT, RN [facility] Agency: [facility] Date [facility] Agency Contacted: 04/05/24 Time HH Agency Contacted: [id number] Representative spoke with at [facility] Agency: Randel Buss  Social Drivers of Health (SDOH) Interventions SDOH Screenings   Food Insecurity: Unknown (04/03/2024)  Housing: Low Risk  (03/01/2024)    Received from [facility]  Transportation Needs: No Transportation Needs (03/01/2024)   Received from [facility]  Utilities: Patient Declined (04/03/2024)  Depression (PHQ2-9): Low Risk  (05/21/2020)  Financial Resource Strain: Low Risk  (01/27/2024)   Received from [facility]  Physical Activity: Inactive (01/27/2024)   Received from [facility]  Social Connections: Socially Isolated (01/27/2024)   Received from [facility]  Stress: No Stress Concern Present (03/01/2024)   Received from [facility]  Recent Concern: Stress - Stress Concern Present (01/27/2024)   Received from [facility]  Tobacco Use: Low Risk  (04/02/2024)  Health Literacy: Medium Risk (02/28/2024)   Received from [facility]    Readmission Risk Interventions    04/03/2024   11:48 AM  Readmission Risk Prevention Plan  Medication Screening Complete  Transportation Screening Complete

## 2024-04-05 NOTE — Discharge Summary (Signed)
 Physician Discharge Summary   Patient: [name] MRN: [id number] DOB: Aug 12, 1951  Admit date:     04/02/2024  Discharge date: 04/05/24  Discharge Physician: Wynetta Heckle   PCP: Laurann Pollock, MD   Recommendations at discharge:  Follow up with PCP in next 1-2 weeks, please recheck CBC with attention to anemia, CMP with attention to hypercalcemia, and magnesium and phosphorus levels.  Follow up the pending SPEP result. Pursue physical rehabilitation.   Discharge Diagnoses: Principal Problem:   UTI (urinary tract infection) Active Problems:   Mixed hyperlipidemia   CAD, ARTERY BYPASS GRAFT   Essential hypertension   Hypercalcemia   Altered mental status   Generalized weakness   Failure to thrive in adult   Hypoalbuminemia due to protein-calorie malnutrition (HCC)   Type 2 diabetes mellitus with hyperglycemia (HCC)   Protein-calorie malnutrition, severe  Hospital Course: [name] is a 73 y.o. female with a history of CAD s/p CABG, HTN, HLD, T2DM, bladder prolapse with indwelling foley catheter who presented to the ED on 04/02/2024 from home by EMS due to AMS and increased weakness. Work up revealed UTI for which voiding trial has been pursued and antibiotics given. She will require further rehabilitation, SNF placement being pursued, though family may opt to take her home with home health.   Assessment and Plan: CAUTI: POA.  - Voiding trial passed, DC'ed foley. Will need urology follow up for the bladder prolapse.  - WBC and temperature remain normal with improvement in mental status on ceftriaxone . Will plan to DC on keflex  pending E. coli Cx sensitivities. Also 20k Enterococcus on culture, though doubt this is clinically significant.   Acute metabolic encephalopathy: Now improved with UTI Tx.     Generalized weakness, failure to thrive in adult - Encourage oral diet, supplements. - Continue fall precaution - PT/ OT eval, rehabilitation will be needed,  though pt's daughter is an Charity fundraiser and has full care RTC at home for the patient, opts to take her home and pursue rehabilitation there.    T2DM: HbA1c 6.3%.  - Continue home Tx.    CAD s/p CABG: No angina.  - Continue aspirin , plavix , not on statin per daughter - Holding BB due to soft BP   History of GI bleed: No current evidence of bleeding. Given the severity of atherosclerosis, we've continued DAPT. Please continue monitoring hgb.   HTN:  - Holding home Tx with soft BP (was held at prior hospitalization as well)   HLD:  - Continue statin  Hypomagnesemia: Has improved, chronic problem  Hypophosphatemia:  - Continue supplementation   Severe protein calorie malnutrition: BMI 18.35   Hypercalcemia: Improved with rehydration, but remains elevated when corrected for hypoalbuminemia. She has no bone pain complaints, and her daughter reports this has been a chronic issue for years. She's not on supplementation.  - Given her concomitant renal insufficiency (more likely due to HTN, T2DM), anemia, and now hypercalcemia, sent myeloma work up which is pending at discharge. Daughter is aware of need to complete this evaluation after discharge.   History of breast CA: Was followed by [facility] Onc, continue routine f/u  Sacral decubitus stage 2 ulcer: POA. Continue home health RN and offloading.   Consultants: None Procedures performed: None  Disposition: Home Diet recommendation:  Regular diet DISCHARGE MEDICATION: Allergies as of 04/05/2024   No Known Allergies      Medication List     STOP taking these medications    hydrochlorothiazide  12.5 MG capsule  Commonly known as: MICROZIDE    ibuprofen 200 MG tablet Commonly known as: ADVIL   lisinopril  20 MG tablet Commonly known as: ZESTRIL    metFORMIN  1000 MG tablet Commonly known as: GLUCOPHAGE    metoprolol  tartrate 25 MG tablet Commonly known as: LOPRESSOR    pravastatin  40 MG tablet Commonly known as: PRAVACHOL    predniSONE 5  MG tablet Commonly known as: DELTASONE       TAKE these medications    aspirin  81 MG chewable tablet Chew 81 mg by mouth daily.   B-D 3CC LUER-LOK SYR 25GX1" 25G X 1" 3 ML Misc Generic drug: SYRINGE-NEEDLE (DISP) 3 ML USE TO INJECT B12 INTRAMUSCULARLY AS DIRECTED   bisacodyl 5 MG EC tablet Generic drug: bisacodyl Take 5 mg by mouth daily as needed for moderate constipation.   blood glucose meter kit and supplies Kit Dispense based on patient and insurance preference. Use up to four times daily as directed. (DxE11.9/E11.59)   cephALEXin  500 MG capsule Commonly known as: KEFLEX  Take 1 capsule (500 mg total) by mouth 2 (two) times daily for 4 days.   clopidogrel  75 MG tablet Commonly known as: PLAVIX  Take 1 tablet (75 mg total) by mouth daily. (NEEDS TO BE SEEN BEFORE NEXT REFILL)   [employer] Probiotic Caps Take 1 capsule by mouth daily.   DULoxetine  60 MG capsule Commonly known as: CYMBALTA  Take 60 mg by mouth daily.   glucose blood test strip Test blood sugar up to 4 times daily. Dx E11.9/ E11.59.  Meter/ strips per insurance/patient preference.   Lancets Misc Use to test blood sugar up to 4 times daily (E11.9/E11.59)   mirtazapine 7.5 MG tablet Commonly known as: REMERON Take 7.5 mg by mouth at bedtime.   NovoLOG  FlexPen 100 UNIT/ML FlexPen Generic drug: insulin  aspart INJECT 4 UNITS INTO THE SKIN 2 (TWO) TIMES DAILY WITH A MEAL. What changed:  how much to take how to take this when to take this reasons to take this additional instructions   oxyCODONE  15 MG immediate release tablet Commonly known as: ROXICODONE  Take 1 tablet by mouth 4 (four) times daily as needed for pain.   pantoprazole 40 MG tablet Commonly known as: PROTONIX Take 40 mg by mouth 2 (two) times daily.   phosphorus 155-852-130 MG tablet Commonly known as: K PHOS NEUTRAL Take 1 tablet (250 mg total) by mouth 2 (two) times daily for 14 days.               Durable Medical Equipment   (From admission, onward)           Start     Ordered   04/05/24 1050  For home use only DME standard manual wheelchair with seat cushion  Once       Comments: Patient suffers from gait dysfunction which impairs their ability to perform daily activities like bathing, dressing, and feeding in the home.  A cane, crutch, or walker will not resolve issue with performing activities of daily living. A wheelchair will allow patient to safely perform daily activities. Patient can safely propel the wheelchair in the home or has a caregiver who can provide assistance. Length of need 6 months . Accessories: elevating leg rests (ELRs), wheel locks, extensions and anti-tippers.   04/05/24 1049            Follow-up Information     Care, [facility] Follow up.   Specialty: Home Health Services Why: PT will call to schedule your next home visit. Contact information: [address] [place] [address] [phone]         Laurann Pollock, MD Follow up.   Specialty: Cardiology Contact information: [address] [place] [address] [phone]                Discharge Exam: Cleavon Curls Weights   04/02/24 1737 04/02/24 2312  Weight: 55 kg 48.5 kg  BP 108/67 (BP Location: Right Arm)   Pulse 95   Temp 97.9 F (36.6 C) (Oral)   Resp 14   Ht 5\' 4"  (1.626 m)   Wt 48.5 kg   SpO2 98%   BMI 18.35 kg/m   Frail female in no distress Diffuse edema, stable Clear, nonlabored without crackles RRR, no JVD Alert, oriented, bradyphrenic   Condition at discharge: stable  The results of significant diagnostics from this hospitalization (including imaging, microbiology, ancillary and laboratory) are listed below for reference.   Imaging Studies: DG Chest Port 1 View Result Date: 04/02/2024 CLINICAL DATA:  Altered mental status EXAM: PORTABLE CHEST 1 VIEW COMPARISON:  09/22/2022 FINDINGS: Post sternotomy changes. Minimal atelectasis left base. Normal cardiac  size. No pleural effusion or pneumothorax IMPRESSION: Minimal atelectasis at the left base. Electronically Signed   By: Esmeralda Hedge M.D.   On: 04/02/2024 19:15   CT HEAD WO CONTRAST Result Date: 04/02/2024 CLINICAL DATA:  Mental status change weakness and lethargy EXAM: CT HEAD WITHOUT CONTRAST TECHNIQUE: Contiguous axial images were obtained from the base of the skull through the vertex without intravenous contrast. RADIATION DOSE REDUCTION: This exam was performed according to the departmental dose-optimization program which includes automated exposure control, adjustment of the mA and/or kV according to patient size and/or use of iterative reconstruction technique. COMPARISON:  CT brain 05/12/2018 report FINDINGS: Brain: No acute territorial infarction, hemorrhage or intracranial mass. Mild chronic small vessel ischemic changes of the white matter. 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 Nonenlarged ventricles Vascular: No hyperdense vessels.  Carotid vascular calcification Skull: Normal. Negative for fracture or focal lesion. Sinuses/Orbits: No acute finding. Other: None IMPRESSION: 1. No CT evidence for acute intracranial abnormality. 2. Mild chronic small vessel ischemic changes of the white matter. 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 Electronically Signed   By: Esmeralda Hedge M.D.   On: 04/02/2024 19:15    Microbiology: Results for orders placed or performed during the hospital encounter of 04/02/24  Blood culture (routine x 2)     Status: None (Preliminary result)   Collection Time: 04/02/24  6:56 PM   Specimen: Left Antecubital; Blood  Result Value Ref Range Status   Specimen Description LEFT ANTECUBITAL AEROBIC BOTTLE ONLY  Final   Special Requests   Final    Blood Culture results may not be optimal due to an inadequate volume of blood received in culture bottles   Culture   Final    NO GROWTH 2  DAYS Performed at [facility], [address] [place], [place] [address]    Report Status PENDING  Incomplete  Blood culture (routine x 2)     Status: None (Preliminary result)   Collection Time: 04/02/24  7:00 PM   Specimen: Right Antecubital; Blood  Result Value Ref Range Status   Specimen Description   Final    RIGHT ANTECUBITAL BOTTLES DRAWN AEROBIC AND ANAEROBIC   Special Requests Blood Culture adequate volume  Final   Culture   Final    NO GROWTH 2 DAYS Performed  at [facility], [address], [place], [place] [address]    Report Status PENDING  Incomplete  Urine Culture (for pregnant, neutropenic or urologic patients or patients with an indwelling urinary catheter)     Status: Abnormal (Preliminary result)   Collection Time: 04/02/24  7:14 PM   Specimen: Urine, Clean Catch  Result Value Ref Range Status   Specimen Description   Final    URINE, CLEAN CATCH Performed at [facility], [address], [place], [place] [address]    Special Requests   Final    NONE Performed at [facility], [address] [place], [place] [address]    Culture (A)  Final    >=100,000 COLONIES/mL ESCHERICHIA COLI Confirmed Extended Spectrum Beta-Lactamase Producer (ESBL).  In bloodstream infections from ESBL organisms, carbapenems are preferred over piperacillin/tazobactam. They are shown to have a lower risk of mortality. 20,000 COLONIES/mL ENTEROCOCCUS FAECALIS SUSCEPTIBILITIES TO FOLLOW Performed at [facility], [address] [place], [place] [address]    Report Status PENDING  Incomplete   Organism ID, Bacteria ESCHERICHIA COLI (A)  Final      Susceptibility   Escherichia coli - MIC*    AMPICILLIN >=32 RESISTANT Resistant     CEFAZOLIN  <=4 SENSITIVE Sensitive     CEFEPIME <=0.12 SENSITIVE Sensitive     CEFTRIAXONE  <=0.25 SENSITIVE Sensitive     CIPROFLOXACIN >=4 RESISTANT Resistant     GENTAMICIN <=1 SENSITIVE Sensitive     IMIPENEM 0.5 SENSITIVE Sensitive      NITROFURANTOIN <=16 SENSITIVE Sensitive     TRIMETH/SULFA <=20 SENSITIVE Sensitive     AMPICILLIN/SULBACTAM >=32 RESISTANT Resistant     PIP/TAZO <=4 SENSITIVE Sensitive ug/mL    * >=100,000 COLONIES/mL ESCHERICHIA COLI    Labs: CBC: Recent Labs  Lab 04/02/24 1856 04/03/24 0529  WBC 6.7 6.5  NEUTROABS 5.3  --   HGB 10.1* 10.4*  HCT 33.3* 34.1*  MCV 93.0 93.4  PLT 358 326   Basic Metabolic Panel: Recent Labs  Lab 04/02/24 1856 04/03/24 0529 04/04/24 2215 04/05/24 0422  NA 134* 133*  --  137  K 4.0 4.3  --  2.9*  CL 99 101  --  102  CO2 26 23  --  27  GLUCOSE 235* 253* 249* 108*  BUN 20 20  --  19  CREATININE 0.65 0.62  --  0.43*  CALCIUM  11.1* 10.3  --  9.9  MG  --  1.8  --   --   PHOS  --  2.3*  --  2.3*   Liver Function Tests: Recent Labs  Lab 04/02/24 1856 04/03/24 0529 04/05/24 0422  AST 11* 11*  --   ALT 10 11  --   ALKPHOS 127* 120  --   BILITOT 0.7 1.0  --   PROT 5.5* 5.2*  --   ALBUMIN 2.0* 1.9* 1.7*   CBG: Recent Labs  Lab 04/04/24 1121 04/04/24 1547 04/04/24 2152 04/04/24 2154 04/05/24 0729  GLUCAP 199* 211* >600* 350* 146*    Discharge time spent: greater than 30 minutes.  Signed: Wynetta Heckle, MD [facility] 04/05/2024

## 2024-04-05 NOTE — TOC Progression Note (Signed)
 Wheelchair note   Patient Details  Name: [name] MRN: [id number] Date of Birth: 20-Jul-1951  Transition of Care [facility]) CM/SW Contact  Orelia Binet, RN Phone Number: 04/05/2024, 11:01 AM  Clinical Narrative:   Patient suffers from generalized weakness, protein-calorie malnutrition, severe , which impairs their ability to perform daily activities like ambulating in the home. A walker will not resolve issue with performing activities of daily living. A wheelchair will allow patient to safely perform daily activities. Patient can safely propel the wheelchair in the home or has a caregiver who can provide assistance.

## 2024-04-05 NOTE — Care Management Obs Status (Signed)
 MEDICARE OBSERVATION STATUS NOTIFICATION   Patient Details  Name: [name] MRN: [id number] Date of Birth: 06/05/1951   Medicare Observation Status Notification Given:  Yes    Orelia Binet, RN 04/05/2024, 11:26 AM

## 2024-04-06 LAB — URINE CULTURE: Culture: >=100000 — AB

## 2024-04-07 LAB — CULTURE, BLOOD (ROUTINE X 2)
Culture: NO GROWTH
Culture: NO GROWTH
Special Requests: ADEQUATE

## 2024-04-08 LAB — MULTIPLE MYELOMA PANEL, SERUM
Albumin SerPl Elph-Mcnc: 1.9 g/dL — ABNORMAL LOW (ref 2.9–4.4)
Albumin/Glob SerPl: 0.8 (ref 0.7–1.7)
Alpha 1: 0.3 g/dL (ref 0.0–0.4)
Alpha2 Glob SerPl Elph-Mcnc: 0.6 g/dL (ref 0.4–1.0)
B-Globulin SerPl Elph-Mcnc: 0.8 g/dL (ref 0.7–1.3)
Gamma Glob SerPl Elph-Mcnc: 1 g/dL (ref 0.4–1.8)
Globulin, Total: 2.7 g/dL (ref 2.2–3.9)
IgA: 299 mg/dL (ref 64–422)
IgG (Immunoglobin G), Serum: 1001 mg/dL (ref 586–1602)
IgM (Immunoglobulin M), Srm: 175 mg/dL (ref 26–217)
Total Protein ELP: 4.6 g/dL — ABNORMAL LOW (ref 6.0–8.5)

## 2024-05-08 ENCOUNTER — Ambulatory Visit: Payer: Self-pay | Admitting: Cardiology

## 2024-11-22 ENCOUNTER — Other Ambulatory Visit: Payer: Self-pay | Admitting: Student

## 2024-11-22 DIAGNOSIS — Z1231 Encounter for screening mammogram for malignant neoplasm of breast: Secondary | ICD-10-CM

## 2024-12-11 ENCOUNTER — Ambulatory Visit
